# Patient Record
Sex: Female | Born: 1945 | Race: White | Hispanic: No | Marital: Married | Smoking: Former smoker
Health system: Southern US, Community
[De-identification: ages and names within clinical notes are randomized; demographics above are authoritative.]

## PROBLEM LIST (undated history)

## (undated) DIAGNOSIS — F32A Depression, unspecified: Secondary | ICD-10-CM

## (undated) DIAGNOSIS — M1990 Unspecified osteoarthritis, unspecified site: Secondary | ICD-10-CM

## (undated) DIAGNOSIS — F329 Major depressive disorder, single episode, unspecified: Secondary | ICD-10-CM

## (undated) DIAGNOSIS — N390 Urinary tract infection, site not specified: Secondary | ICD-10-CM

## (undated) DIAGNOSIS — I10 Essential (primary) hypertension: Secondary | ICD-10-CM

## (undated) DIAGNOSIS — M879 Osteonecrosis, unspecified: Secondary | ICD-10-CM

## (undated) DIAGNOSIS — E119 Type 2 diabetes mellitus without complications: Secondary | ICD-10-CM

## (undated) DIAGNOSIS — R0600 Dyspnea, unspecified: Secondary | ICD-10-CM

## (undated) DIAGNOSIS — E6609 Other obesity due to excess calories: Secondary | ICD-10-CM

## (undated) DIAGNOSIS — Z87442 Personal history of urinary calculi: Secondary | ICD-10-CM

## (undated) DIAGNOSIS — J449 Chronic obstructive pulmonary disease, unspecified: Secondary | ICD-10-CM

## (undated) DIAGNOSIS — Z9289 Personal history of other medical treatment: Secondary | ICD-10-CM

## (undated) DIAGNOSIS — F419 Anxiety disorder, unspecified: Secondary | ICD-10-CM

## (undated) HISTORY — DX: Anxiety disorder, unspecified: F41.9

## (undated) HISTORY — DX: Urinary tract infection, site not specified: N39.0

## (undated) HISTORY — DX: Osteonecrosis, unspecified: M87.9

## (undated) HISTORY — PX: FINGER SURGERY: SHX640

## (undated) HISTORY — DX: Essential (primary) hypertension: I10

## (undated) HISTORY — PX: TUBAL LIGATION: SHX77

## (undated) HISTORY — PX: CATARACT EXTRACTION: SUR2

## (undated) HISTORY — PX: TOTAL KNEE ARTHROPLASTY: SHX125

## (undated) HISTORY — DX: Depression, unspecified: F32.A

## (undated) HISTORY — DX: Personal history of other medical treatment: Z92.89

## (undated) HISTORY — DX: Other obesity due to excess calories: E66.09

## (undated) HISTORY — PX: OTHER SURGICAL HISTORY: SHX169

## (undated) HISTORY — DX: Type 2 diabetes mellitus without complications: E11.9

## (undated) HISTORY — DX: Unspecified osteoarthritis, unspecified site: M19.90

## (undated) HISTORY — DX: Major depressive disorder, single episode, unspecified: F32.9

---

## 1997-08-31 ENCOUNTER — Ambulatory Visit (HOSPITAL_BASED_OUTPATIENT_CLINIC_OR_DEPARTMENT_OTHER): Admission: RE | Admit: 1997-08-31 | Discharge: 1997-08-31 | Payer: Self-pay | Admitting: Orthopedic Surgery

## 1997-12-04 ENCOUNTER — Other Ambulatory Visit: Admission: RE | Admit: 1997-12-04 | Discharge: 1997-12-04 | Payer: Self-pay | Admitting: Specialist

## 1999-01-07 ENCOUNTER — Ambulatory Visit: Admission: RE | Admit: 1999-01-07 | Discharge: 1999-01-07 | Payer: Self-pay | Admitting: Specialist

## 1999-02-05 ENCOUNTER — Encounter: Payer: Self-pay | Admitting: Specialist

## 1999-02-11 ENCOUNTER — Encounter: Payer: Self-pay | Admitting: Specialist

## 1999-02-11 ENCOUNTER — Inpatient Hospital Stay (HOSPITAL_COMMUNITY): Admission: RE | Admit: 1999-02-11 | Discharge: 1999-02-17 | Payer: Self-pay | Admitting: Specialist

## 2003-10-15 ENCOUNTER — Ambulatory Visit (HOSPITAL_COMMUNITY): Admission: RE | Admit: 2003-10-15 | Discharge: 2003-10-15 | Payer: Self-pay | Admitting: *Deleted

## 2004-02-05 ENCOUNTER — Ambulatory Visit: Payer: Self-pay | Admitting: Family Medicine

## 2004-12-09 ENCOUNTER — Ambulatory Visit: Payer: Self-pay | Admitting: Family Medicine

## 2004-12-17 ENCOUNTER — Ambulatory Visit: Payer: Self-pay | Admitting: Family Medicine

## 2005-06-23 ENCOUNTER — Ambulatory Visit: Payer: Self-pay | Admitting: Family Medicine

## 2006-02-23 ENCOUNTER — Ambulatory Visit: Payer: Self-pay | Admitting: Family Medicine

## 2006-02-24 ENCOUNTER — Encounter: Admission: RE | Admit: 2006-02-24 | Discharge: 2006-02-24 | Payer: Self-pay | Admitting: Family Medicine

## 2006-03-03 ENCOUNTER — Ambulatory Visit: Payer: Self-pay | Admitting: Family Medicine

## 2006-03-04 ENCOUNTER — Ambulatory Visit: Payer: Self-pay | Admitting: Family Medicine

## 2006-09-01 ENCOUNTER — Telehealth: Payer: Self-pay | Admitting: Family Medicine

## 2006-09-01 ENCOUNTER — Ambulatory Visit: Payer: Self-pay | Admitting: Family Medicine

## 2006-09-01 LAB — CONVERTED CEMR LAB
Bilirubin Urine: NEGATIVE
Blood in Urine, dipstick: NEGATIVE
Glucose, Urine, Semiquant: NEGATIVE
Nitrite: NEGATIVE
Protein, U semiquant: NEGATIVE
Specific Gravity, Urine: 1.025
Urobilinogen, UA: 0.2
WBC Urine, dipstick: NEGATIVE
pH: 5

## 2006-09-08 ENCOUNTER — Encounter: Payer: Self-pay | Admitting: Family Medicine

## 2006-11-16 ENCOUNTER — Telehealth: Payer: Self-pay | Admitting: Family Medicine

## 2006-11-24 ENCOUNTER — Ambulatory Visit: Payer: Self-pay | Admitting: Family Medicine

## 2006-11-24 LAB — CONVERTED CEMR LAB
Bilirubin Urine: NEGATIVE
Blood in Urine, dipstick: NEGATIVE
Glucose, Urine, Semiquant: NEGATIVE
Ketones, urine, test strip: NEGATIVE
Nitrite: NEGATIVE
Specific Gravity, Urine: >=1.03
Urobilinogen, UA: 0.2
WBC Urine, dipstick: NEGATIVE
pH: 5.5

## 2006-12-01 DIAGNOSIS — I10 Essential (primary) hypertension: Secondary | ICD-10-CM

## 2006-12-02 ENCOUNTER — Ambulatory Visit: Payer: Self-pay | Admitting: Family Medicine

## 2006-12-02 LAB — CONVERTED CEMR LAB
ALT: 47 units/L — ABNORMAL HIGH (ref 0–35)
AST: 41 units/L — ABNORMAL HIGH (ref 0–37)
Albumin: 3.9 g/dL (ref 3.5–5.2)
Alkaline Phosphatase: 111 units/L (ref 39–117)
BUN: 13 mg/dL (ref 6–23)
Basophils Absolute: 0.1 10*3/uL (ref 0.0–0.1)
Basophils Relative: 0.5 % (ref 0.0–1.0)
Bilirubin, Direct: 0.2 mg/dL (ref 0.0–0.3)
CO2: 29 meq/L (ref 19–32)
Calcium: 9.7 mg/dL (ref 8.4–10.5)
Chloride: 105 meq/L (ref 96–112)
Cholesterol: 186 mg/dL (ref 0–200)
Creatinine, Ser: 0.9 mg/dL (ref 0.4–1.2)
Eosinophils Absolute: 0.4 10*3/uL (ref 0.0–0.6)
Eosinophils Relative: 3.5 % (ref 0.0–5.0)
GFR calc Af Amer: 82 mL/min
GFR calc non Af Amer: 67 mL/min
Glucose, Bld: 119 mg/dL — ABNORMAL HIGH (ref 70–99)
HCT: 38.2 % (ref 36.0–46.0)
HDL: 35.4 mg/dL — ABNORMAL LOW (ref >39.0)
Hemoglobin: 13.2 g/dL (ref 12.0–15.0)
LDL Cholesterol: 125 mg/dL — ABNORMAL HIGH (ref 0–99)
Lymphocytes Relative: 34.1 % (ref 12.0–46.0)
MCHC: 34.6 g/dL (ref 30.0–36.0)
MCV: 89.9 fL (ref 78.0–100.0)
Monocytes Absolute: 0.5 10*3/uL (ref 0.2–0.7)
Monocytes Relative: 5.1 % (ref 3.0–11.0)
Neutro Abs: 5.8 10*3/uL (ref 1.4–7.7)
Neutrophils Relative %: 56.8 % (ref 43.0–77.0)
Platelets: 409 10*3/uL — ABNORMAL HIGH (ref 150–400)
Potassium: 4.6 meq/L (ref 3.5–5.1)
RBC: 4.25 M/uL (ref 3.87–5.11)
RDW: 12.7 % (ref 11.5–14.6)
Sodium: 141 meq/L (ref 135–145)
TSH: 1.14 microintl units/mL (ref 0.35–5.50)
Total Bilirubin: 0.8 mg/dL (ref 0.3–1.2)
Total CHOL/HDL Ratio: 5.3
Total Protein: 6.8 g/dL (ref 6.0–8.3)
Triglycerides: 128 mg/dL (ref 0–149)
VLDL: 26 mg/dL (ref 0–40)
WBC: 10.3 10*3/uL (ref 4.5–10.5)

## 2006-12-28 ENCOUNTER — Telehealth: Payer: Self-pay | Admitting: Family Medicine

## 2007-04-29 ENCOUNTER — Ambulatory Visit: Payer: Self-pay | Admitting: Family Medicine

## 2007-07-21 ENCOUNTER — Ambulatory Visit: Payer: Self-pay | Admitting: Family Medicine

## 2007-07-21 DIAGNOSIS — J4489 Other specified chronic obstructive pulmonary disease: Secondary | ICD-10-CM | POA: Insufficient documentation

## 2007-07-21 DIAGNOSIS — J449 Chronic obstructive pulmonary disease, unspecified: Secondary | ICD-10-CM

## 2007-07-21 DIAGNOSIS — E669 Obesity, unspecified: Secondary | ICD-10-CM

## 2007-07-21 DIAGNOSIS — F341 Dysthymic disorder: Secondary | ICD-10-CM

## 2007-09-14 ENCOUNTER — Telehealth: Payer: Self-pay | Admitting: Family Medicine

## 2008-02-14 ENCOUNTER — Telehealth: Payer: Self-pay | Admitting: Family Medicine

## 2008-02-15 ENCOUNTER — Ambulatory Visit: Payer: Self-pay | Admitting: Family Medicine

## 2008-02-15 DIAGNOSIS — J069 Acute upper respiratory infection, unspecified: Secondary | ICD-10-CM | POA: Insufficient documentation

## 2008-07-10 ENCOUNTER — Telehealth: Payer: Self-pay | Admitting: Family Medicine

## 2008-07-12 ENCOUNTER — Telehealth: Payer: Self-pay | Admitting: Family Medicine

## 2008-08-16 ENCOUNTER — Telehealth: Payer: Self-pay | Admitting: Family Medicine

## 2008-10-15 ENCOUNTER — Ambulatory Visit: Payer: Self-pay | Admitting: Family Medicine

## 2009-06-18 ENCOUNTER — Telehealth: Payer: Self-pay | Admitting: Family Medicine

## 2009-07-17 ENCOUNTER — Telehealth: Payer: Self-pay | Admitting: Family Medicine

## 2009-07-19 ENCOUNTER — Ambulatory Visit: Payer: Self-pay | Admitting: Family Medicine

## 2009-07-19 LAB — CONVERTED CEMR LAB
Bilirubin Urine: NEGATIVE
Blood in Urine, dipstick: NEGATIVE
Glucose, Urine, Semiquant: NEGATIVE
Ketones, urine, test strip: NEGATIVE
Nitrite: NEGATIVE
Protein, U semiquant: NEGATIVE
Specific Gravity, Urine: >=1.03
Urobilinogen, UA: 0.2
WBC Urine, dipstick: NEGATIVE
pH: 5

## 2009-08-08 ENCOUNTER — Ambulatory Visit: Payer: Self-pay | Admitting: Family Medicine

## 2009-08-08 LAB — CONVERTED CEMR LAB
ALT: 31 units/L (ref 0–35)
AST: 31 units/L (ref 0–37)
Albumin: 4.1 g/dL (ref 3.5–5.2)
Alkaline Phosphatase: 90 units/L (ref 39–117)
BUN: 14 mg/dL (ref 6–23)
Basophils Absolute: 0 10*3/uL (ref 0.0–0.1)
Basophils Relative: 0.6 % (ref 0.0–3.0)
Bilirubin, Direct: 0.1 mg/dL (ref 0.0–0.3)
CO2: 30 meq/L (ref 19–32)
Calcium: 9.7 mg/dL (ref 8.4–10.5)
Chloride: 108 meq/L (ref 96–112)
Cholesterol: 178 mg/dL (ref 0–200)
Creatinine, Ser: 0.8 mg/dL (ref 0.4–1.2)
Eosinophils Absolute: 0.3 10*3/uL (ref 0.0–0.7)
Eosinophils Relative: 3.1 % (ref 0.0–5.0)
GFR calc non Af Amer: 79.99 mL/min (ref >60)
Glucose, Bld: 113 mg/dL — ABNORMAL HIGH (ref 70–99)
HCT: 40.7 % (ref 36.0–46.0)
HDL: 36.5 mg/dL — ABNORMAL LOW (ref >39.00)
Hemoglobin: 13.8 g/dL (ref 12.0–15.0)
LDL Cholesterol: 121 mg/dL — ABNORMAL HIGH (ref 0–99)
Lymphocytes Relative: 35.6 % (ref 12.0–46.0)
Lymphs Abs: 3 10*3/uL (ref 0.7–4.0)
MCHC: 34 g/dL (ref 30.0–36.0)
MCV: 92.9 fL (ref 78.0–100.0)
Monocytes Absolute: 0.5 10*3/uL (ref 0.1–1.0)
Monocytes Relative: 5.9 % (ref 3.0–12.0)
Neutro Abs: 4.6 10*3/uL (ref 1.4–7.7)
Neutrophils Relative %: 54.8 % (ref 43.0–77.0)
Platelets: 353 10*3/uL (ref 150.0–400.0)
Potassium: 4.8 meq/L (ref 3.5–5.1)
RBC: 4.37 M/uL (ref 3.87–5.11)
RDW: 13.2 % (ref 11.5–14.6)
Sodium: 143 meq/L (ref 135–145)
TSH: 0.88 microintl units/mL (ref 0.35–5.50)
Total Bilirubin: 0.7 mg/dL (ref 0.3–1.2)
Total CHOL/HDL Ratio: 5
Total Protein: 7.4 g/dL (ref 6.0–8.3)
Triglycerides: 105 mg/dL (ref 0.0–149.0)
VLDL: 21 mg/dL (ref 0.0–40.0)
WBC: 8.4 10*3/uL (ref 4.5–10.5)

## 2009-09-10 ENCOUNTER — Telehealth: Payer: Self-pay | Admitting: Family Medicine

## 2009-10-31 ENCOUNTER — Telehealth: Payer: Self-pay | Admitting: Family Medicine

## 2009-12-30 ENCOUNTER — Ambulatory Visit: Payer: Self-pay | Admitting: Internal Medicine

## 2009-12-30 DIAGNOSIS — M545 Low back pain: Secondary | ICD-10-CM

## 2009-12-30 DIAGNOSIS — N23 Unspecified renal colic: Secondary | ICD-10-CM

## 2009-12-30 LAB — CONVERTED CEMR LAB
Bilirubin Urine: NEGATIVE
Glucose, Urine, Semiquant: NEGATIVE
Ketones, urine, test strip: NEGATIVE
Nitrite: NEGATIVE
Specific Gravity, Urine: 1.015
Urobilinogen, UA: 0.2
WBC Urine, dipstick: NEGATIVE
pH: 6

## 2010-01-22 ENCOUNTER — Telehealth: Payer: Self-pay | Admitting: Family Medicine

## 2010-01-30 ENCOUNTER — Telehealth: Payer: Self-pay | Admitting: Family Medicine

## 2010-02-06 ENCOUNTER — Ambulatory Visit
Admission: RE | Admit: 2010-02-06 | Discharge: 2010-02-06 | Payer: Self-pay | Source: Home / Self Care | Attending: Family Medicine | Admitting: Family Medicine

## 2010-02-06 ENCOUNTER — Telehealth: Payer: Self-pay | Admitting: Family Medicine

## 2010-02-06 LAB — CONVERTED CEMR LAB
Protein, U semiquant: NEGATIVE
Urobilinogen, UA: 0.2
WBC Urine, dipstick: NEGATIVE

## 2010-02-07 ENCOUNTER — Encounter: Payer: Self-pay | Admitting: Family Medicine

## 2010-02-25 NOTE — Progress Notes (Signed)
Summary: refill  clorazepate  Phone Note Call from Patient Call back at [phone]   Caller: Patient---live cal Summary of Call: pt has a cpx on 08-01-2009. please refill her clorazepate 3.75mg   until cpx.   [facility] [place]---[phone]. only has 5 pills left. Initial call taken by: Warnell Forester,  July 17, 2009 1:39 PM  Follow-up for Phone Call        ok x 1 til seen.  Follow-up by: Pura Spice, RN,  July 17, 2009 2:41 PM    Prescriptions: CLORAZEPATE DIPOTASSIUM 3.75 MG  TABS (CLORAZEPATE DIPOTASSIUM) three times a day  #90 x 0   Entered by:   Pura Spice, RN   Authorized by:   Judithann Sheen MD   Signed by:   Pura Spice, RN on 07/17/2009   Method used:   Telephoned to ...       [facility]  [address] [phone][facility] ([facility]ail[facility]       [address]       [place], [place]  [address]       Ph: [phone] or [phone]       Fax: [phone]   RxID:   [id number]

## 2010-02-25 NOTE — Progress Notes (Signed)
Summary: needs appt   ---- Converted from flag ---- ---- 06/17/2009 8:17 AM, Lucy Antigua wrote: I called Shannon Lynch and told her that her potassium has been called in, but she needs a cpx. She said that she would call back later and sch cpx for next month.      ---- 06/13/2009 10:13 AM, Pura Spice, RN wrote: would you call Shannon Lynch  [phone] for cpx  tell her i called in her postassium but she needs to be seen thanks ------------------------------

## 2010-02-25 NOTE — Assessment & Plan Note (Signed)
Summary: cpx/ccm----PT [facility] // RS   Vital Signs:  Patient profile:   65 year old female Height:      61 inches Weight:      234 pounds O2 Sat:      98 % Temp:     98.3 degrees F Pulse rate:   85 / minute Pulse rhythm:   regular BP sitting:   130 / 82  (left arm) Cuff size:   large  Vitals Entered By: Pura Spice, RN (August 08, 2009 1:38 PM) CC: cpx No Pap. Refused EKG    History of Present Illness: This 65 year old white married female whose husband is dilatated with several medical problems but is still able to work. 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 Had torn rotator cuff 5 years ago and has been on the treatment of Dr. Hayden Rasmussen Blood pressure one control Has had no episodes of wheezing, no ulnar problems  Allergies: 1)  ! Codeine 2)  ! Cipro  Past History:  Past Medical History: Last updated: 12/01/2006 Hypertension  Past Surgical History: Last updated: 12/01/2006 Total knee replacement  Review of Systems      See HPI  The patient denies anorexia, fever, weight loss, weight gain, vision loss, decreased hearing, hoarseness, chest pain, syncope, dyspnea on exertion, peripheral edema, prolonged cough, headaches, hemoptysis, abdominal pain, melena, hematochezia, severe indigestion/heartburn, hematuria, incontinence, genital sores, muscle weakness, suspicious skin lesions, transient blindness, difficulty walking, depression, unusual weight change, abnormal bleeding, enlarged lymph nodes, angioedema, breast masses, and testicular masses.    Physical Exam  General:   Well-developed,well-nourished,in no acute distress; alert,appropriate and cooperative throughout examinationoverweight-appearing.   Head:  Normocephalic and atraumatic without obvious abnormalities. No apparent alopecia or balding. Eyes:  No corneal or conjunctival inflammation noted. EOMI. Perrla. Funduscopic exam benign, without hemorrhages, exudates or papilledema. Vision grossly normal. Ears:  External ear exam shows no significant lesions or deformities.  Otoscopic examination reveals clear canals, tympanic membranes are intact bilaterally without bulging, retraction, inflammation or discharge. Hearing is grossly normal bilaterally. Nose:  External nasal examination shows no deformity or inflammation. Nasal mucosa are pink and moist without lesions or exudates. Mouth:  Oral mucosa and oropharynx without lesions or exudates.  Teeth in good repair. Neck:  No deformities, masses, or tenderness noted. Chest Wall:  No deformities, masses, or tenderness noted. Breasts:  No mass, nodules, thickening, tenderness, bulging, retraction, inflamation, nipple discharge or skin changes noted.   Lungs:  Normal respiratory effort, chest expands symmetrically. Lungs are clear to auscultation, no crackles or wheezes. Heart:  Normal rate and regular rhythm. S1 and S2 normal without gallop, murmur, click, rub or other extra sounds. Abdomen:  Bowel sounds positive,abdomen soft and non-tender without masses, organomegaly or hernias noted., obese Rectal:  I examined Genitalia:  not examined patient desired to wait and return for Pap Msk:  tenderness over her right rotator cuff area limited use on hyperextension and also posteriorly Pulses:  R and L carotid,radial,femoral,dorsalis pedis and posterior tibial pulses are full and equal bilaterally Extremities:  left pretibial edema and right pretibial edema.   Neurologic:  No cranial nerve deficits noted. Station and gait are normal. Plantar reflexes are down-going  bilaterally. DTRs are symmetrical throughout. Sensory, motor and coordinative functions appear intact. Skin:  Intact without suspicious lesions or rashes Cervical Nodes:  No lymphadenopathy noted Axillary Nodes:  No palpable lymphadenopathy Inguinal Nodes:  No significant adenopathyR inguinal LN matted.   Psych:  Cognition and judgment appear intact. Alert and cooperative with normal attention span and concentration. No apparent delusions, illusions, hallucinations   Impression & Recommendations:  Problem # 1:  PHYSICAL EXAMINATION (ICD-V70.0) Assessment New  Problem # 2:  ANXIETY DEPRESSION (ICD-300.4) Assessment: Deteriorated continue half tablet a Celexa and start Cymbalta 30 mg q.d. for one week then increase to 60 mg q. day  Problem # 3:  EXOGENOUS OBESITY (ICD-278.00) Assessment: Unchanged Pjhenteramine 37.5 q.a.m. for decreased appetite  Problem # 4:  COPD, MILD (ICD-496) Assessment: Improved  Her updated medication list for this problem includes:    Proair Hfa 108 (90 Base) Mcg/act Aers (Albuterol sulfate) .Marland Kitchen... 2 inhalations three times a day for wheezing  Problem # 5:  HYPERTENSION (ICD-401.9) Assessment: Improved  Her updated medication list for this problem includes:    Furosemide 20 Mg Tabs (Furosemide) .Marland Kitchen..Marland Kitchen Two times a day for edema    Cozaar 50 Mg Tabs (Losartan potassium) .Marland Kitchen... 1 once daily for bp  Complete Medication List: 1)  Clorazepate Dipotassium 3.75 Mg Tabs (Clorazepate dipotassium) .... Three times a day 2)  Furosemide 20 Mg Tabs (Furosemide) .... Two times a day for edema 3)  Proair Hfa 108 (90 Base) Mcg/act Aers (Albuterol sulfate) .... 2 inhalations three times a day for wheezing 4)  Meloxicam 15 Mg Tabs (Meloxicam) .Marland Kitchen.. 1 qd 5)  Klor-con M20 20 Meq Cr-tabs (Potassium chloride crys cr) .Marland Kitchen.. 1 qd 6)  Cozaar 50 Mg Tabs (Losartan potassium) .Marland Kitchen.. 1 once daily for bp 7)  Celexa 40 Mg Tabs (Citalopram hydrobromide) .Marland Kitchen.. 1 qd 8)  Levaquin 500 Mg Tabs  (Levofloxacin) .Marland Kitchen.. 1 once daily for infection 9)  Phenteramine 37.5  .Marland Kitchen.. 1 each morning to decrease  appetite 10)  Cymbalta 60 Mg Cpep (Duloxetine hcl) .... Terminal with each day for one week then 60 mg one day  Patient Instructions: 1)  Labs good 2)  continue 1/2 tab each dayCelexa 3)  start cymbalta 30 mg  daily for 1 week then increase 60 mg reach day 4)  refilled medications 5)  You need to lose weight. Consider a lower calorie diet and regular exercise.  Prescriptions: PHENTERAMINE 37.5 1 each morning to decrease  appetite  #30 x 3   Entered and Authorized by:   Judithann Sheen MD   Signed by:   Judithann Sheen MD on 08/08/2009   Method used:   Print then Give to Patient   RxID:   [id number] CLORAZEPATE DIPOTASSIUM 3.75 MG  TABS (CLORAZEPATE DIPOTASSIUM) three times a day  #90 x 5   Entered and Authorized by:   Judithann Sheen MD   Signed by:   Judithann Sheen MD on 08/08/2009   Method used:   Print then Give to Patient   RxID:   [id number] CELEXA 40 MG TABS (CITALOPRAM HYDROBROMIDE) 1 qd  #30 x 11   Entered and Authorized by:   Judithann Sheen MD   Signed by:   Judithann Sheen MD on 08/08/2009   Method used:   [facility]        [facility]  [address] [phone]* (retail)       [address]       [place], [place]  [address]       Ph: [phone] or [phone]       Fax: [phone]   RxID:   [id number] COZAAR 50 MG TABS (LOSARTAN POTASSIUM) 1 once daily for BP  #30 x 11   Entered and Authorized by:   Judithann Sheen MD   Signed by:   Judithann Sheen MD on 08/08/2009   Method used:   [facility]ically [facility]        [facility]  [address] [phone]* (retail)       [address]       [place], [place]  [address]       Ph: [phone] or [phone]       Fax: [phone]   RxID:   [id number] KLOR-CON M20 20 MEQ CR-TABS (POTASSIUM CHLORIDE CRYS CR) 1 qd  #30 x 11   Entered and  Authorized by:   Judithann Sheen MD   Signed by:   Judithann Sheen MD on 08/08/2009   Method used:   Electronically to        [facility]  [address] [phone]* (retail)       [address]       [place], [place]  [address]       Ph: [phone] or [phone]       Fax: [phone]   RxID:   [id number] MELOXICAM 15 MG  TABS (MELOXICAM) 1 qd  #30 x 11   Entered and Authorized by:   Judithann Sheen MD   Signed by:   Judithann Sheen MD on 08/08/2009   Method used:   Electronically to        [facility]  [address] [phone]* (retail)       [address]       [place], [place]  [address]       Ph: [phone] or [phone]       Fax: [phone]   RxID:   [id number] FUROSEMIDE 20 MG  TABS (FUROSEMIDE) two times a day for edema  #60 x 11   Entered and Authorized by:   Judithann Sheen MD   Signed by:   Judithann Sheen MD on 08/08/2009   Method used:   Electronically to        [facility]  [address] [phone]* (retail)       [address]       [place], [place]  [address]       Ph: [phone] or [phone]       Fax: [phone]   RxID:   [id number] LEVAQUIN 500 MG TABS (LEVOFLOXACIN) 1 once daily for infection  #10 x 1   Entered and Authorized by:   Judithann Sheen MD   Signed by:   Judithann Sheen MD on 08/08/2009   Method used:   Electronically [facility]        [facility]  [address] [phone]* (retail)       [address]       [place], [place]  [address]  Ph: [phone] or [phone]       Fax: [phone]   RxID:   [id number]

## 2010-02-25 NOTE — Progress Notes (Signed)
Summary: samples  Phone Note Call from Patient   Caller: Patient Call For: Judithann Sheen MD Summary of Call: Needs Cymbalta 60 mg. samples.  Left #28 pills to pick up. Initial call taken by: Lynann Beaver CMA,  October 31, 2009 10:02 AM  Follow-up for Phone Call        I think the pills have already been left for patient please verify Follow-up by: Danise Edge MD,  October 31, 2009 10:43 AM    New/Updated Medications: CYMBALTA 60 MG CPEP (DULOXETINE HCL) one daily Prescriptions: CYMBALTA 60 MG CPEP (DULOXETINE HCL) one daily  #30 x 1   Entered by:   Josph Macho RMA   Authorized by:   Danise Edge MD   Signed by:   Josph Macho RMA on 10/31/2009   Method used:   [facility]        [facility]  [address] [phone]* (retail)       [address]       [place], [place]  [address]       Ph: [phone] or [phone]       Fax: [phone]   RxID:   [id number]  I tried to call patient to inform her of meds being sent to pharmacy but phone just rings no vm/ CF

## 2010-02-25 NOTE — Assessment & Plan Note (Signed)
Summary: ? kidney stones?/dm   Vital Signs:  Patient profile:   65 year old female Weight:      232 pounds Temp:     97.7 degrees F oral BP sitting:   124 / 80  (left arm) Cuff size:   large  Vitals Entered By: Duard Brady LPN (December 30, 2009 1:04 PM) CC: c/o low bacl pain , (L) lower abd pain   ???kidney stone Is Patient Diabetic? No   CC:  c/o low bacl pain  and (L) lower abd pain   ???kidney stone.  History of Present Illness: 65 year old patient who presents with a two week history of left flank discomfort.  two days ago, the patient has severe left leg pain with radiation to the left lower quadrant.  No prior history of kidney stones.  She's had intermittent mild pain for the past two days, but this morning had total resolution of discomfort.  No hematuria.  A  UA was reviewed today that revealed large occult blood, otherwise, normal  Allergies: 1)  ! Codeine 2)  ! Cipro  Past History:  Past Medical History: Reviewed history from 12/01/2006 and no changes required. Hypertension  Past Surgical History: Reviewed history from 12/01/2006 and no changes required. Total knee replacement  Review of Systems       The patient complains of abdominal pain.  The patient denies anorexia, fever, weight loss, weight gain, vision loss, decreased hearing, hoarseness, chest pain, syncope, dyspnea on exertion, peripheral edema, prolonged cough, headaches, hemoptysis, melena, hematochezia, severe indigestion/heartburn, hematuria, incontinence, genital sores, muscle weakness, suspicious skin lesions, transient blindness, difficulty walking, depression, unusual weight change, abnormal bleeding, enlarged lymph nodes, angioedema, and breast masses.    Physical Exam  General:  overweight-appearing.  normal blood pressure, no distressoverweight-appearing.   Neck:  No deformities, masses, or tenderness noted. Lungs:  Normal respiratory effort, chest expands symmetrically. Lungs are  clear to auscultation, no crackles or wheezes. Heart:  Normal rate and regular rhythm. S1 and S2 normal without gallop, murmur, click, rub or other extra sounds. Abdomen:  nCVA tenderness.  No abdominal tenderness.  Bowel sounds normal.  No guarding.  No organomegaly or masses   Impression & Recommendations:  Problem # 1:  RENAL COLIC (ICD-788.0) this is most consistent with renal colic.  She has been totally pain-free for most of the day.  She has been asked to force fluids and to call for any recurrent discomfort.  She has been asked to return in 6 weeks for follow-up and repeat UA  Problem # 2:  HYPERTENSION (ICD-401.9)  Her updated medication list for this problem includes:    Furosemide 20 Mg Tabs (Furosemide) .Marland Kitchen..Marland Kitchen Two times a day for edema    Cozaar 50 Mg Tabs (Losartan potassium) .Marland Kitchen... 1 once daily for bp  Her updated medication list for this problem includes:    Furosemide 20 Mg Tabs (Furosemide) .Marland Kitchen..Marland Kitchen Two times a day for edema    Cozaar 50 Mg Tabs (Losartan potassium) .Marland Kitchen... 1 once daily for bp  Complete Medication List: 1)  Clorazepate Dipotassium 3.75 Mg Tabs (Clorazepate dipotassium) .... Three times a day 2)  Furosemide 20 Mg Tabs (Furosemide) .... Two times a day for edema 3)  Proair Hfa 108 (90 Base) Mcg/act Aers (Albuterol sulfate) .... 2 inhalations three times a day for wheezing 4)  Meloxicam 15 Mg Tabs (Meloxicam) .Marland Kitchen.. 1 qd 5)  Klor-con M20 20 Meq Cr-tabs (Potassium chloride crys cr) .Marland Kitchen.. 1 qd 6)  Cozaar 50 Mg Tabs (Losartan potassium) .Marland Kitchen.. 1 once daily for bp 7)  Celexa 40 Mg Tabs (Citalopram hydrobromide) .Marland Kitchen.. 1 qd 8)  Levaquin 500 Mg Tabs (Levofloxacin) .Marland Kitchen.. 1 once daily for infection 9)  Phenteramine 37.5  .Marland Kitchen.. 1 each morning to decrease  appetite 10)  Cymbalta 60 Mg Cpep (Duloxetine hcl) .... One daily  Other Orders: UA Dipstick w/o Micro (manual) ([address])  Patient Instructions: 1)  Please schedule a follow-up appointment in 6  weeks Dr Scotty Court 2)  Drink  as much fluid as you can tolerate for the next few days. 3)  Limit your Sodium (Salt) to less than 2 grams a day(slightly less than 1/2 a teaspoon) to prevent fluid retention, swelling, or worsening of symptoms.   Orders Added: 1)  UA Dipstick w/o Micro (manual) [81002] 2)  Est. Patient Level III [60[address]]    Laboratory Results   Urine Tests  Date/Time Received: December 30, 2009 1:16 PM  Date/Time Reported: December 30, 2009 1:16 PM   Routine Urinalysis   Color: yellow Appearance: Hazy Glucose: negative   (Normal Range: Negative) Bilirubin: negative   (Normal Range: Negative) Ketone: negative   (Normal Range: Negative) Spec. Gravity: 1.015   (Normal Range: 1.003-1.035) Blood: large   (Normal Range: Negative) pH: 6.0   (Normal Range: 5.0-8.0) Protein: trace   (Normal Range: Negative) Urobilinogen: 0.2   (Normal Range: 0-1) Nitrite: negative   (Normal Range: Negative) Leukocyte Esterace: negative   (Normal Range: Negative)

## 2010-02-25 NOTE — Progress Notes (Signed)
Summary: med questions  Phone Note Call from Patient   Caller: Patient Call For: Judithann Sheen MD Summary of Call: Cymbalta 60 mg one by mouth daily....Marland KitchenMarland KitchenCelexa 40 mg. 1/2 q day.  Helping joints, but mid-morning "has a lag", and can get upset easily. [phone]  [phone]  Needs more samples. Initial call taken by: Lynann Beaver CMA,  September 10, 2009 1:04 PM  Follow-up for Phone Call        no samples celexa but do have cymbalta  available will call in refill ? drug store  Follow-up by: Pura Spice, RN,  September 10, 2009 1:28 PM  Additional Follow-up for Phone Call Additional follow up Details #1::        Pt wants to speak to Dr. Scotty Court or Almira Coaster about her progress on meds, and any changes he may need to make.  Did not want to talk to me. Additional Follow-up by: Lynann Beaver CMA,  September 10, 2009 1:32 PM    Additional Follow-up for Phone Call Additional follow up Details #2::    pt notiifed and samples cymbalta will be at front desk Nadirah Socorro mills wants dr Scotty Court to know she is on generic celexa clorzapate and cymbalta 60mg  and feels like crying between 7 am -11 am .  but felt little better this week, and stated will stay on meds like she been doing and will call in 10 days with update . Follow-up by: Pura Spice, RN,  September 10, 2009 1:54 PM  Additional Follow-up for Phone Call Additional follow up Details #3:: Details for Additional Follow-up Action Taken: dr Scotty Court   called pt and informed her to take celexa 40mg  1 by mouth once daily  Additional Follow-up by: Pura Spice, RN,  September 10, 2009 2:26 PM

## 2010-02-27 NOTE — Progress Notes (Signed)
Summary: antibotic  Phone Note Call from Patient Call back at [facility] Phone [phone]   Caller: Patient Call For: stafford Summary of Call: pt has cough and congestion and a yeast infection.  Wants something called in Initial call taken by: Alfred Levins, CMA,  February 06, 2010 11:24 AM  Follow-up for Phone Call        Pt is calling to ask someone to call her back about her tests, and symptoms. [phone] Follow-up by: Lynann Beaver CMA AAMA,  February 07, 2010 9:25 AM  Additional Follow-up for Phone Call Additional follow up Details #1::        Pt is wondering why a med has not been called in yet and why she has yet to rcv her lab results. Pls call asap today. Pls call pt asap and let her know status. [phone] Additional Follow-up by: Lucy Antigua,  February 07, 2010 9:49 AM    Additional Follow-up for Phone Call Additional follow up Details #2::    Called pt and discussed tx Follow-up by: Judithann Sheen MD,  February 07, 2010 4:43 PM  8

## 2010-02-27 NOTE — Progress Notes (Signed)
Summary: refill Levaquin  Phone Note Call from Patient   Caller: Patient Call For: Judithann Sheen MD [facility] [employer] Pt still has dysuria and some chest congestion.  Wants to refill Levaquin. [phone] Initial call taken by: Lynann Beaver CMA AAMA,  January 30, 2010 12:33 PM  Follow-up for Phone Call        Per Dr. Scotty Court- pt needs a ua and culture done Follow-up by: Romualdo Bolk, CMA Duncan Dull),  February 04, 2010 8:47 AM  Additional Follow-up for Phone Call Additional follow up Details #1::        Pt will come Thursday for Urine Culture and UA Additional Follow-up by: [facility] CMA AAMA,  February 04, 2010 8:52 AM

## 2010-02-27 NOTE — Progress Notes (Signed)
Summary: wants dr Scotty Court to return call  Phone Note Call from Patient Call back at [phone] or [phone]   Caller: Patient---live call Reason for Call: Talk to Doctor Summary of Call: wants Dr Scotty Court to return her call. been in bed. Initial call taken by: Warnell Forester,  January 22, 2010 9:05 AM  Follow-up for Phone Call        been sick for 2 weeks alot of congestion coughing alot taking mucinex dm sputum clear to green . Has not taking fever .  saw dr Kirtland Bouchard. few weeks ago  and was told had kidney stone and now on levaquin and pyridium from last yr and she started taking this last nite.  pls call to [employer] [facility] [facility].   Follow-up by: Pura Spice, RN,  January 23, 2010 10:21 AM  Additional Follow-up for Phone Call Additional follow up Details #1::        dr Scotty Court spoke with patient Additional Follow-up by: Kern Reap CMA Duncan Dull),  January 24, 2010 10:34 AM

## 2010-04-25 ENCOUNTER — Other Ambulatory Visit: Payer: Self-pay | Admitting: Family Medicine

## 2010-05-03 ENCOUNTER — Other Ambulatory Visit: Payer: Self-pay | Admitting: Family Medicine

## 2010-05-05 ENCOUNTER — Telehealth: Payer: Self-pay

## 2010-05-05 NOTE — Telephone Encounter (Signed)
Pt usually gets Cymbalta auto refilled but pharmacy called and said a doctor that she does not know faxed and said it could not be refilled-----pt would like to know if this is a mistake and would like to know if it can be refilled.  Please advise

## 2010-05-06 ENCOUNTER — Other Ambulatory Visit: Payer: Self-pay

## 2010-05-06 MED ORDER — DULOXETINE HCL 60 MG PO CPEP: 60.0000 mg | ORAL_CAPSULE | Freq: Every day | ORAL | Status: DC

## 2010-05-06 NOTE — Telephone Encounter (Signed)
refilled 

## 2010-06-13 NOTE — Op Note (Signed)
NAME:  Shannon Lynch, Shannon Lynch                        ACCOUNT NO.:  [id number]   MEDICAL RECORD NO.:  [id number]                   PATIENT TYPE:  AMB   LOCATION:  ENDO                                 FACILITY:  [facility]   PHYSICIAN:  Georgiana Spinner, M.D.                 DATE OF BIRTH:  1945-08-31   DATE OF PROCEDURE:  10/15/2003  DATE OF DISCHARGE:                                 OPERATIVE REPORT   PROCEDURE:  Colonoscopy.   INDICATIONS:  Colon cancer screening.   ANESTHESIA:  Demerol 75, Versed 8 mg.   DESCRIPTION OF PROCEDURE:  With patient mildly sedated in the left lateral  decubitus position, the Olympus videoscopic colonoscope was inserted in the  rectum and passed under direct vision to the cecum, identified by the  ileocecal valve and appendiceal orifice, both of which were photographed.  From this point, the colonoscope was slowly withdrawn, taking  circumferential views of the colonic mucosa, stopping only in the rectum  which appeared normal on direct and showed hemorrhoids on retroflexed view.  The endoscope was straightened, withdrawn.  The patient's vital signs and  pulse oximeter remained stable.  The patient tolerated the procedure well  without apparent complications.   FINDINGS:  Internal hemorrhoids.  Otherwise, unremarkable examination.      GMO/MEDQ  D:  10/15/2003  T:  10/15/2003  Job:  [id number]

## 2010-06-25 ENCOUNTER — Ambulatory Visit (INDEPENDENT_AMBULATORY_CARE_PROVIDER_SITE_OTHER): Payer: BC Managed Care – PPO | Admitting: Family Medicine

## 2010-06-25 ENCOUNTER — Other Ambulatory Visit (INDEPENDENT_AMBULATORY_CARE_PROVIDER_SITE_OTHER): Payer: BC Managed Care – PPO

## 2010-06-25 ENCOUNTER — Encounter: Payer: Self-pay | Admitting: Family Medicine

## 2010-06-25 VITALS — BP 112/74 | HR 57 | Temp 98.0°F | Wt 232.0 lb

## 2010-06-25 DIAGNOSIS — E6609 Other obesity due to excess calories: Secondary | ICD-10-CM

## 2010-06-25 DIAGNOSIS — F419 Anxiety disorder, unspecified: Secondary | ICD-10-CM

## 2010-06-25 DIAGNOSIS — M545 Low back pain, unspecified: Secondary | ICD-10-CM

## 2010-06-25 DIAGNOSIS — M129 Arthropathy, unspecified: Secondary | ICD-10-CM

## 2010-06-25 DIAGNOSIS — F329 Major depressive disorder, single episode, unspecified: Secondary | ICD-10-CM

## 2010-06-25 DIAGNOSIS — N390 Urinary tract infection, site not specified: Secondary | ICD-10-CM

## 2010-06-25 DIAGNOSIS — E669 Obesity, unspecified: Secondary | ICD-10-CM

## 2010-06-25 DIAGNOSIS — F341 Dysthymic disorder: Secondary | ICD-10-CM

## 2010-06-25 DIAGNOSIS — M1990 Unspecified osteoarthritis, unspecified site: Secondary | ICD-10-CM

## 2010-06-25 DIAGNOSIS — R0602 Shortness of breath: Secondary | ICD-10-CM

## 2010-06-25 LAB — POCT URINALYSIS DIPSTICK
Bilirubin, UA: NEGATIVE
Blood, UA: NEGATIVE
Ketones, UA: NEGATIVE
Spec Grav, UA: 1.03
pH, UA: 5.5

## 2010-06-25 MED ORDER — PHENAZOPYRIDINE HCL 200 MG PO TABS: 200.0000 mg | ORAL_TABLET | Freq: Three times a day (TID) | ORAL | Status: DC | PRN

## 2010-06-25 MED ORDER — LEVOFLOXACIN 500 MG PO TABS: 500.0000 mg | ORAL_TABLET | Freq: Every day | ORAL | Status: DC

## 2010-07-06 ENCOUNTER — Encounter: Payer: Self-pay | Admitting: Family Medicine

## 2010-07-06 NOTE — Progress Notes (Signed)
  Subjective:    Patient ID: Shannon Lynch, female    DOB: 03-06-1945, 65 y.o.   MRN: [id number] Deceased 65-year-old white married female he complained of dysuria urinary frequency urgency as well as occasionally bled after she urinates she has a kidney stone in December but has no pain at this time urine or pain again significant that she is taking peridium t.i.d. With some early patient continues to have anxiety stress and some depression and takes Tranxene Celexa and Cymbalta also has arthritis for which she takes Mobic 15 mg q. Day Continues to have a difficult time losing weight HPI    Review of Systems no other symptoms see present illness     Objective:   Physical Exampatient is a well-developed well-nourished obese white female who appears uncomfortable and are anxious but cooperative Heart and lung examination revealed no acute problems 1+ pretibial edema        Assessment & Plan:  Acute cystitis or urinary tract infection treated with Levaquin plus peridium. Anxiety depression continue regular medications Hypertension control 112/74 continue regular meds Arthritis continue Mobic 15 mg each day Continued to recommend weight reduction

## 2010-07-06 NOTE — Patient Instructions (Signed)
Despite the fact that her urinalysis is negative at this time with her symptoms and past history of feeling nausea treated for acute urinary tract infection and requested she be a urinalysis and finished in the medication Return in one month for your physical examination for which you are scheduled

## 2010-09-03 ENCOUNTER — Other Ambulatory Visit: Payer: Self-pay | Admitting: Family Medicine

## 2010-09-05 ENCOUNTER — Other Ambulatory Visit: Payer: Self-pay | Admitting: Family Medicine

## 2010-10-07 ENCOUNTER — Other Ambulatory Visit: Payer: Self-pay

## 2010-10-07 MED ORDER — CLORAZEPATE DIPOTASSIUM 3.75 MG PO TABS: 3.7500 mg | ORAL_TABLET | Freq: Three times a day (TID) | ORAL | Status: DC

## 2010-10-07 NOTE — Telephone Encounter (Signed)
Ok per Dr. Scotty Court to fill clorazepate 3.75 mg 90 x 5 rf.

## 2010-10-21 ENCOUNTER — Other Ambulatory Visit: Payer: Self-pay | Admitting: Family Medicine

## 2010-12-15 ENCOUNTER — Encounter: Payer: Self-pay | Admitting: Internal Medicine

## 2010-12-15 ENCOUNTER — Ambulatory Visit (INDEPENDENT_AMBULATORY_CARE_PROVIDER_SITE_OTHER): Payer: BC Managed Care – PPO | Admitting: Internal Medicine

## 2010-12-15 VITALS — BP 140/80 | HR 109 | Temp 97.4°F | Resp 20 | Wt 234.0 lb

## 2010-12-15 DIAGNOSIS — J40 Bronchitis, not specified as acute or chronic: Secondary | ICD-10-CM

## 2010-12-15 MED ORDER — DOXYCYCLINE HYCLATE 100 MG PO TABS: 100.0000 mg | ORAL_TABLET | Freq: Two times a day (BID) | ORAL | Status: AC

## 2010-12-15 MED ORDER — ALBUTEROL 90 MCG/ACT IN AERS: 2.0000 | INHALATION_SPRAY | Freq: Four times a day (QID) | RESPIRATORY_TRACT | Status: DC | PRN

## 2010-12-17 DIAGNOSIS — J40 Bronchitis, not specified as acute or chronic: Secondary | ICD-10-CM | POA: Insufficient documentation

## 2010-12-17 NOTE — Progress Notes (Signed)
  Subjective:    Patient ID: Shannon Lynch, female    DOB: [birth year], 65 y.o.   MRN: [id number]  HPI Pt presents to clinic for evaluation of cough. Notes 5d h/o earache, chest congestion and cough productive for clear sputum without hemoptysis. Has subjective wheezing without dyspnea. Attempting mucinex without significant improvement. No other alleviating or exacerbating factors. No other complaints.  Past Medical History  Diagnosis Date  . Hypertension   . Exogenous obesity   . Arthritis   . Anxiety   . Depression    Past Surgical History  Procedure Date  . Total knee arthroplasty     reports that she has quit smoking. She has never used smokeless tobacco. She reports that she does not drink alcohol or use illicit drugs. family history is not on file. Allergies  Allergen Reactions  . Ciprofloxacin   . Codeine        Review of Systems see hpi     Objective:   Physical Exam  Nursing note and vitals reviewed. Constitutional: She appears well-developed and well-nourished.  HENT:  Head: Normocephalic and atraumatic.  Right Ear: External ear normal.  Left Ear: External ear normal.  Nose: Nose normal.  Mouth/Throat: Oropharynx is clear and moist. No oropharyngeal exudate.  Eyes: Conjunctivae are normal. No scleral icterus.  Neck: Neck supple.  Cardiovascular: Normal rate, regular rhythm and normal heart sounds.   Pulmonary/Chest: Effort normal and breath sounds normal. No respiratory distress. She has no wheezes. She has no rales.  Lymphadenopathy:    She has no cervical adenopathy.  Neurological: She is alert.  Skin: Skin is warm and dry.  Psychiatric: She has a normal mood and affect.          Assessment & Plan:

## 2010-12-17 NOTE — Assessment & Plan Note (Signed)
Attempt doxycycline. 7days. Albuterol mdi prn. Followup if no improvement or worsening.

## 2011-01-01 ENCOUNTER — Encounter: Payer: BC Managed Care – PPO | Admitting: Internal Medicine

## 2011-02-06 ENCOUNTER — Inpatient Hospital Stay (HOSPITAL_COMMUNITY)
Admission: EM | Admit: 2011-02-06 | Discharge: 2011-02-10 | DRG: 294 | Disposition: A | Discharge status: Home / Self Care | Payer: BC Managed Care – PPO | Attending: Internal Medicine | Admitting: Internal Medicine

## 2011-02-06 ENCOUNTER — Encounter (HOSPITAL_COMMUNITY): Payer: Self-pay | Admitting: Internal Medicine

## 2011-02-06 DIAGNOSIS — J4489 Other specified chronic obstructive pulmonary disease: Secondary | ICD-10-CM | POA: Diagnosis present

## 2011-02-06 DIAGNOSIS — E669 Obesity, unspecified: Secondary | ICD-10-CM | POA: Diagnosis present

## 2011-02-06 DIAGNOSIS — E1100 Type 2 diabetes mellitus with hyperosmolarity without nonketotic hyperglycemic-hyperosmolar coma (NKHHC): Principal | ICD-10-CM | POA: Diagnosis present

## 2011-02-06 DIAGNOSIS — S4291XA Fracture of right shoulder girdle, part unspecified, initial encounter for closed fracture: Secondary | ICD-10-CM | POA: Diagnosis present

## 2011-02-06 DIAGNOSIS — N23 Unspecified renal colic: Secondary | ICD-10-CM | POA: Diagnosis present

## 2011-02-06 DIAGNOSIS — M545 Low back pain: Secondary | ICD-10-CM

## 2011-02-06 DIAGNOSIS — J40 Bronchitis, not specified as acute or chronic: Secondary | ICD-10-CM

## 2011-02-06 DIAGNOSIS — E119 Type 2 diabetes mellitus without complications: Secondary | ICD-10-CM | POA: Diagnosis present

## 2011-02-06 DIAGNOSIS — J449 Chronic obstructive pulmonary disease, unspecified: Secondary | ICD-10-CM | POA: Diagnosis present

## 2011-02-06 DIAGNOSIS — F3289 Other specified depressive episodes: Secondary | ICD-10-CM | POA: Diagnosis present

## 2011-02-06 DIAGNOSIS — F411 Generalized anxiety disorder: Secondary | ICD-10-CM | POA: Diagnosis present

## 2011-02-06 DIAGNOSIS — Z4789 Encounter for other orthopedic aftercare: Secondary | ICD-10-CM

## 2011-02-06 DIAGNOSIS — N390 Urinary tract infection, site not specified: Secondary | ICD-10-CM | POA: Diagnosis present

## 2011-02-06 DIAGNOSIS — F341 Dysthymic disorder: Secondary | ICD-10-CM | POA: Diagnosis present

## 2011-02-06 DIAGNOSIS — I10 Essential (primary) hypertension: Secondary | ICD-10-CM | POA: Diagnosis present

## 2011-02-06 DIAGNOSIS — Z96659 Presence of unspecified artificial knee joint: Secondary | ICD-10-CM

## 2011-02-06 DIAGNOSIS — F329 Major depressive disorder, single episode, unspecified: Secondary | ICD-10-CM | POA: Diagnosis present

## 2011-02-06 DIAGNOSIS — E1110 Type 2 diabetes mellitus with ketoacidosis without coma: Secondary | ICD-10-CM

## 2011-02-06 DIAGNOSIS — E860 Dehydration: Secondary | ICD-10-CM | POA: Diagnosis present

## 2011-02-06 LAB — CBC
HCT: 47.2 % — ABNORMAL HIGH (ref 36.0–46.0)
Hemoglobin: 15.9 g/dL — ABNORMAL HIGH (ref 12.0–15.0)
Platelets: 459 10*3/uL — ABNORMAL HIGH (ref 150–400)
RBC: 5.2 MIL/uL — ABNORMAL HIGH (ref 3.87–5.11)
WBC: 16.4 10*3/uL — ABNORMAL HIGH (ref 4.0–10.5)

## 2011-02-06 LAB — BASIC METABOLIC PANEL
BUN: 7 mg/dL (ref 6–23)
BUN: 6 mg/dL (ref 6–23)
Calcium: 11.2 mg/dL — ABNORMAL HIGH (ref 8.4–10.5)
Creatinine, Ser: 0.58 mg/dL (ref 0.50–1.10)
GFR calc Af Amer: >90 mL/min (ref >90)
GFR calc Af Amer: >90 mL/min (ref >90)
GFR calc non Af Amer: 86 mL/min — ABNORMAL LOW (ref >90)
GFR calc non Af Amer: >90 mL/min (ref >90)
Glucose, Bld: 670 mg/dL (ref 70–99)
Potassium: 3.5 mEq/L (ref 3.5–5.1)
Sodium: 128 mEq/L — ABNORMAL LOW (ref 135–145)

## 2011-02-06 LAB — DIFFERENTIAL
Basophils Relative: 0 % (ref 0–1)
Eosinophils Absolute: 0.1 10*3/uL (ref 0.0–0.7)
Eosinophils Relative: 0 % (ref 0–5)
Monocytes Relative: 4 % (ref 3–12)
Neutrophils Relative %: 78 % — ABNORMAL HIGH (ref 43–77)

## 2011-02-06 LAB — URINALYSIS, ROUTINE W REFLEX MICROSCOPIC
Bilirubin Urine: NEGATIVE
Hgb urine dipstick: NEGATIVE
Nitrite: NEGATIVE
Protein, ur: NEGATIVE mg/dL
Specific Gravity, Urine: 1.037 — ABNORMAL HIGH (ref 1.005–1.030)
Urobilinogen, UA: 0.2 mg/dL (ref 0.0–1.0)

## 2011-02-06 LAB — BLOOD GAS, VENOUS
Acid-Base Excess: 2.9 mmol/L — ABNORMAL HIGH (ref 0.0–2.0)
TCO2: 25.7 mmol/L (ref 0–100)
pCO2, Ven: 50.9 mmHg — ABNORMAL HIGH (ref 45.0–50.0)

## 2011-02-06 LAB — CARDIAC PANEL(CRET KIN+CKTOT+MB+TROPI): Troponin I: <0.3 ng/mL (ref <0.30)

## 2011-02-06 LAB — GLUCOSE, CAPILLARY: Glucose-Capillary: >600 mg/dL (ref 70–99)

## 2011-02-06 MED ORDER — SODIUM CHLORIDE 0.9 % IV BOLUS (SEPSIS)
1000.0000 mL | Freq: Once | INTRAVENOUS | Status: AC
  Administered 2011-02-06: 1000 mL via INTRAVENOUS

## 2011-02-06 MED ORDER — INSULIN ASPART 100 UNIT/ML ~~LOC~~ SOLN
6.0000 [IU] | Freq: Three times a day (TID) | SUBCUTANEOUS | Status: DC
  Administered 2011-02-07 – 2011-02-10 (×11): 6 [IU] via SUBCUTANEOUS

## 2011-02-06 MED ORDER — SODIUM CHLORIDE 0.9 % IV SOLN
INTRAVENOUS | Status: DC
  Filled 2011-02-06: qty 1

## 2011-02-06 MED ORDER — INSULIN ASPART 100 UNIT/ML ~~LOC~~ SOLN
0.0000 [IU] | Freq: Every day | SUBCUTANEOUS | Status: DC
  Administered 2011-02-08: 2 [IU] via SUBCUTANEOUS

## 2011-02-06 MED ORDER — ONDANSETRON HCL 4 MG/2ML IJ SOLN
4.0000 mg | Freq: Once | INTRAMUSCULAR | Status: AC
  Administered 2011-02-06: 4 mg via INTRAVENOUS
  Filled 2011-02-06: qty 2

## 2011-02-06 MED ORDER — DEXTROSE-NACL 5-0.45 % IV SOLN: INTRAVENOUS | Status: DC

## 2011-02-06 MED ORDER — INSULIN ASPART 100 UNIT/ML ~~LOC~~ SOLN
0.0000 [IU] | Freq: Three times a day (TID) | SUBCUTANEOUS | Status: DC
  Administered 2011-02-07: 4 [IU] via SUBCUTANEOUS
  Administered 2011-02-07: 11 [IU] via SUBCUTANEOUS
  Administered 2011-02-07: 20 [IU] via SUBCUTANEOUS
  Administered 2011-02-08: 4 [IU] via SUBCUTANEOUS
  Administered 2011-02-08: 11 [IU] via SUBCUTANEOUS
  Administered 2011-02-08 – 2011-02-09 (×3): 7 [IU] via SUBCUTANEOUS
  Administered 2011-02-09 – 2011-02-10 (×3): 4 [IU] via SUBCUTANEOUS
  Administered 2011-02-10: 7 [IU] via SUBCUTANEOUS
  Filled 2011-02-06 (×2): qty 3

## 2011-02-06 MED ORDER — DEXTROSE 50 % IV SOLN: 25.0000 mL | INTRAVENOUS | Status: DC | PRN

## 2011-02-06 MED ORDER — POTASSIUM CHLORIDE 10 MEQ/100ML IV SOLN: 10.0000 meq | INTRAVENOUS | Status: DC

## 2011-02-06 MED ORDER — SODIUM CHLORIDE 0.9 % IV SOLN: INTRAVENOUS | Status: DC

## 2011-02-06 NOTE — ED Notes (Signed)
Called lab. Blood will be run soon.

## 2011-02-06 NOTE — ED Notes (Signed)
BS: 670 PER LAB.

## 2011-02-06 NOTE — H&P (Signed)
PCP:   Estill Cotta, MD   Chief Complaint: Elevated blood glucose.   HPI: Shannon Lynch is an 66 y.o. female with history of obesity, hypertension, anxiety and depression, presents to her primary care physician and was found to have elevated blood glucose. It should be noted that she was not diagnosed with diabetes prior to today. She was sent to the emergency room and was confirmed that her blood glucose was 600. She has been experiencing symptoms of hypoglycemia including intermittent blurry vision, polydipsia, polyuria, and polyphagia. She denied any chest pain or shortness of breath. She has had no abdominal cramps or pain. She was given diuretic as an augmentation (reportedly by her) to her blood pressure medication. She was found to have hyperosmolar nonketotic hyperglycemia with a bicarbonate of 25, and a potassium of 3.5, blood sugar is 670 and concomitant serum sodium of 128. She was initially started on DKA glucose stabilizer, but with 1 L of fluid, her blood glucose dropped down to the 300s. Also, she was recently fell and had a nonsurgical right shoulder fracture, evaluated by orthopedics, and it isnow in a sling. Hospitalist was asked to admit her for evaluation of hyperglycemia.  Rewiew of Systems:  The patient denies anorexia, fever, weight loss,, vision loss, decreased hearing, hoarseness, chest pain, syncope, dyspnea on exertion, peripheral edema, balance deficits, hemoptysis, abdominal pain, melena, hematochezia, severe indigestion/heartburn, hematuria, incontinence, genital sores, muscle weakness, suspicious skin lesions, transient blindness, difficulty walking, depression, unusual weight change, abnormal bleeding, enlarged lymph nodes, angioedema, and breast masses.    Past Medical History  Diagnosis Date  . Hypertension   . Exogenous obesity   . Arthritis   . Anxiety   . Depression     Past Surgical History  Procedure Date  . Total knee arthroplasty      Medications:  HOME MEDS: Prior to Admission medications   Medication Sig Start Date End Date Taking? Authorizing Provider  acetaminophen (TYLENOL) 500 MG tablet Take 1,000 mg by mouth every 6 (six) hours as needed. pain   Yes Historical Provider, MD  albuterol (PROVENTIL,VENTOLIN) 90 MCG/ACT inhaler Inhale 2 puffs into the lungs every 6 (six) hours as needed for wheezing. 12/15/10 12/10/11 Yes Thomas Whitson [employer].  cholecalciferol (VITAMIN D) 1000 UNITS tablet Take 1,000 Units by mouth daily.   Yes Historical Provider, MD  citalopram (CELEXA) 40 MG tablet TAKE 1 TABLET BY MOUTH EVERY DAY 10/21/10  Yes Willie Ransom Alphonzo [facility].  clorazepate (TRANXENE) 3.75 MG tablet Take 1 tablet (3.75 mg total) by mouth 3 (three) times daily. 10/07/10  Yes Willie Minus Breeding.  DULoxetine (CYMBALTA) 60 MG capsule Take 1 capsule (60 mg total) by mouth daily. 05/06/10 05/06/11 Yes Willie Ransom Alphonzo Severance.  furosemide (LASIX) 20 MG tablet Take 20 mg by mouth 2 (two) times daily.     Yes Historical Provider, MD  HYDROmorphone (DILAUDID) 2 MG tablet Take 2 mg by mouth every 4 (four) hours as needed. pain   Yes Historical Provider, MD  KLOR-CON M20 20 MEQ tablet TAKE 1 TABLET BY MOUTH EVERY DAY 09/05/10  Yes Willie Ransom Alphonzo Severance.  losartan (COZAAR) 50 MG tablet Take 50 mg by mouth daily.     Yes Historical Provider, MD  Naproxen-Esomeprazole (VIMOVO PO) Take by mouth daily.     Yes Historical Provider, MD  vitamin C (ASCORBIC ACID) 500 MG tablet Take 500 mg by mouth daily.   Yes Historical Provider, MD     Allergies:  Allergies  Allergen Reactions  . Ciprofloxacin Hives  . Codeine Nausea And Vomiting    Social History:   reports that she has quit smoking. She has never used smokeless tobacco. She reports that she does not drink alcohol or use illicit drugs. she is married, has no children.  Family History: No family history on file. she has no family history of diabetes.   Physical  Exam: Filed Vitals:   02/06/11 1827 02/06/11 2230 02/06/11 2300 02/06/11 2334  BP: 129/72 156/74 154/74 157/65  Pulse: 117   98  Temp: 97.7 F (36.5 C)     TempSrc: Oral     Resp: 18   18  Height: 5\' 3"  (1.6 m)     Weight: 99.338 kg (219 lb)     SpO2: 98%   95%   Blood pressure 157/65, pulse 98, temperature 97.7 F (36.5 C), temperature source Oral, resp. rate 18, height 5\' 3"  (1.6 m), weight 99.338 kg (219 lb), SpO2 95.00%.  GEN:  Pleasant  person lying in the stretcher in no acute distress; cooperative with exam PSYCH:  alert and oriented x4; does not appear anxious does not appear depressed; affect is normal HEENT: Mucous membranes pink and anicteric; PERRLA; EOM intact; no cervical lymphadenopathy nor thyromegaly or carotid bruit; no JVD; Breasts:: Not examined CHEST WALL: No tenderness CHEST: Normal respiration, clear to auscultation bilaterally HEART: Regular rate and rhythm; no murmurs rubs or gallops BACK: No kyphosis or scoliosis; no CVA tenderness ABDOMEN: Obese, soft non-tender; no masses, no organomegaly, normal abdominal bowel sounds; no pannus; no intertriginous candida. Rectal Exam: Not done EXTREMITIES: No bone or joint deformity; age-appropriate arthropathy of the hands and knees; no edema; no ulcerations. She has a fracture right shoulder, nonsurgical, evaluated by orthopedics, and is in a sling.  Genitalia: not examined PULSES: 2+ and symmetric SKIN: Normal hydration no rash or ulceration CNS: Cranial nerves 2-12 grossly intact no focal neurologic deficit   Labs & Imaging Results for orders placed during the hospital encounter of 02/06/11 (from the past 48 hour(s))  GLUCOSE, CAPILLARY     Status: Abnormal   Collection Time   02/06/11  6:30 PM      Component Value Range Comment   Glucose-Capillary >600 (*) 70 - 99 (mg/dL)   URINALYSIS, ROUTINE W REFLEX MICROSCOPIC     Status: Abnormal   Collection Time   02/06/11  6:53 PM      Component Value Range Comment    Color, Urine YELLOW  YELLOW     APPearance CLEAR  CLEAR     Specific Gravity, Urine 1.037 (*) 1.005 - 1.030     pH 6.0  5.0 - 8.0     Glucose, UA >1000 (*) NEGATIVE (mg/dL)    Hgb urine dipstick NEGATIVE  NEGATIVE     Bilirubin Urine NEGATIVE  NEGATIVE     Ketones, ur NEGATIVE  NEGATIVE (mg/dL)    Protein, ur NEGATIVE  NEGATIVE (mg/dL)    Urobilinogen, UA 0.2  0.0 - 1.0 (mg/dL)    Nitrite NEGATIVE  NEGATIVE     Leukocytes, UA NEGATIVE  NEGATIVE    URINE MICROSCOPIC-ADD ON     Status: Normal   Collection Time   02/06/11  6:53 PM      Component Value Range Comment   Squamous Epithelial / LPF RARE  RARE    CBC     Status: Abnormal   Collection Time   02/06/11  7:00 PM      Component  Value Range Comment   WBC 16.4 (*) 4.0 - 10.5 (K/uL)    RBC 5.20 (*) 3.87 - 5.11 (MIL/uL)    Hemoglobin 15.9 (*) 12.0 - 15.0 (g/dL)    HCT 16.1 (*) 09.6 - 46.0 (%)    MCV 90.8  78.0 - 100.0 (fL)    MCH 30.6  26.0 - 34.0 (pg)    MCHC 33.7  30.0 - 36.0 (g/dL)    RDW 04.5  40.9 - 81.1 (%)    Platelets 459 (*) 150 - 400 (K/uL)   DIFFERENTIAL     Status: Abnormal   Collection Time   02/06/11  7:00 PM      Component Value Range Comment   Neutrophils Relative 78 (*) 43 - 77 (%)    Neutro Abs 12.7 (*) 1.7 - 7.7 (K/uL)    Lymphocytes Relative 18  12 - 46 (%)    Lymphs Abs 2.9  0.7 - 4.0 (K/uL)    Monocytes Relative 4  3 - 12 (%)    Monocytes Absolute 0.7  0.1 - 1.0 (K/uL)    Eosinophils Relative 0  0 - 5 (%)    Eosinophils Absolute 0.1  0.0 - 0.7 (K/uL)    Basophils Relative 0  0 - 1 (%)    Basophils Absolute 0.1  0.0 - 0.1 (K/uL)   BASIC METABOLIC PANEL     Status: Abnormal   Collection Time   02/06/11  7:00 PM      Component Value Range Comment   Sodium 128 (*) 135 - 145 (mEq/L)    Potassium 3.5  3.5 - 5.1 (mEq/L)    Chloride 84 (*) 96 - 112 (mEq/L)    CO2 25  19 - 32 (mEq/L)    Glucose, Bld 670 (*) 70 - 99 (mg/dL)    BUN 7  6 - 23 (mg/dL)    Creatinine, Ser 9.14  0.50 - 1.10 (mg/dL)    Calcium  78.2 (*) 8.4 - 10.5 (mg/dL)    GFR calc non Af Amer 86 (*) >90 (mL/min)    GFR calc Af Amer >90  >90 (mL/min)   BASIC METABOLIC PANEL     Status: Abnormal   Collection Time   02/06/11 10:10 PM      Component Value Range Comment   Sodium 134 (*) 135 - 145 (mEq/L)    Potassium 3.6  3.5 - 5.1 (mEq/L)    Chloride 96  96 - 112 (mEq/L)    CO2 25  19 - 32 (mEq/L)    Glucose, Bld 394 (*) 70 - 99 (mg/dL)    BUN 6  6 - 23 (mg/dL)    Creatinine, Ser 9.56  0.50 - 1.10 (mg/dL)    Calcium 9.6  8.4 - 10.5 (mg/dL)    GFR calc non Af Amer >90  >90 (mL/min)    GFR calc Af Amer >90  >90 (mL/min)   GLUCOSE, CAPILLARY     Status: Abnormal   Collection Time   02/06/11 10:43 PM      Component Value Range Comment   Glucose-Capillary 375 (*) 70 - 99 (mg/dL)   BLOOD GAS, VENOUS     Status: Abnormal   Collection Time   02/06/11 10:55 PM      Component Value Range Comment   FIO2 0.21      pH, Ven 7.370 (*) 7.250 - 7.300     pCO2, Ven 50.9 (*) 45.0 - 50.0 (mmHg)    pO2, Ven 30.0  30.0 -  45.0 (mmHg)    Bicarbonate 28.7 (*) 20.0 - 24.0 (mEq/L)    TCO2 25.7  0 - 100 (mmol/L)    Acid-Base Excess 2.9 (*) 0.0 - 2.0 (mmol/L)    O2 Saturation 49.1      Patient temperature 98.6      Collection site VEIN      Drawn by COLLECTED BY NURSE      Sample type VEIN     CARDIAC PANEL(CRET KIN+CKTOT+MB+TROPI)     Status: Normal   Collection Time   02/06/11 10:58 PM      Component Value Range Comment   Total CK 50  7 - 177 (U/L)    CK, MB 2.2  0.3 - 4.0 (ng/mL)    Troponin I <0.30  <0.30 (ng/mL)    Relative Index RELATIVE INDEX IS INVALID  0.0 - 2.5     No results found.    Assessment Present on Admission:  .Type 2 diabetes mellitus with hyperosmolar nonketotic hyperglycemia .EXOGENOUS OBESITY .HYPERTENSION .ANXIETY DEPRESSION Fracture right shoulder, evaluated, nonsurgical.  PLAN: She will be admitted to telemetry. She will be given fluids and insulin (she needs more fluid than insulin). She is clearly  hyperosmolar. I will stop her diuretic. When she is better, with persistent normal creatinine, I would start her on Glucophage. Although she did not have any chest pain, we'll rule out with serial CPKs and troponins as well. She will be needing potassium, and we'll continue to monitor. She is stable, full code, and will be admitted to [facility]ad hospitalist service. I will continue her other medications for anxiety, pain, and her blood pressure.   Other plans as per orders.   Matei Magnone 02/06/2011, 11:39 PM

## 2011-02-06 NOTE — ED Provider Notes (Signed)
History     CSN: [id number]  Arrival date & time 02/06/11  1752   First MD Initiated Contact with Patient 02/06/11 1856      Chief Complaint  Patient presents with  . Hyperglycemia    sent by UC in [facility]  New onset.  CBG <400 by MD at [facility].    (Consider location/radiation/quality/duration/timing/severity/associated sxs/prior treatment) HPI Comments: Patient presented to an urgent care today after she has not felt well over the last one to 2 weeks.  She noted that she had some pneumonia in December and felt like she never quite got better.  She did describe some cold symptoms and that she's just been increasingly tired.  She's also had increased thirst and increased urination.  Patient has had some intermittent nausea as well.  On review of systems she also had some diarrhea this week.  Minimal to no abdominal pain.  No fevers.  No chest pain or shortness of breath.  Patient was evaluated in the urgent care today and was found to have hyperglycemia and recommended to come to the emergency department for possible admission to the hospital.  Patient is a 66 y.o. female presenting with weakness. The history is provided by the patient. No language interpreter was used.  Weakness The primary symptoms include dizziness and nausea. Primary symptoms do not include headaches, syncope, loss of consciousness, altered mental status, paresthesias, fever or vomiting. The symptoms began more than 1 week ago. The symptoms are worsening.  Dizziness also occurs with nausea and weakness. Dizziness does not occur with vomiting.  Additional symptoms include weakness.    Past Medical History  Diagnosis Date  . Hypertension   . Exogenous obesity   . Arthritis   . Anxiety   . Depression     Past Surgical History  Procedure Date  . Total knee arthroplasty     No family history on file.  History  Substance Use Topics  . Smoking status: Former Games developer  . Smokeless tobacco: Never Used  . Alcohol  Use: No    OB History    Grav Para Term Preterm Abortions TAB SAB Ect Mult Living                  Review of Systems  Constitutional: Positive for fatigue. Negative for fever and chills.  HENT: Positive for congestion.   Eyes: Negative.  Negative for discharge and redness.  Respiratory: Negative.  Negative for cough and shortness of breath.   Cardiovascular: Negative.  Negative for chest pain and syncope.  Gastrointestinal: Positive for nausea. Negative for vomiting, abdominal pain and diarrhea.  Genitourinary: Positive for frequency. Negative for dysuria and vaginal discharge.  Musculoskeletal: Negative.  Negative for back pain.  Skin: Negative.  Negative for color change and rash.  Neurological: Positive for dizziness and weakness. Negative for loss of consciousness, syncope, headaches and paresthesias.  Hematological: Negative.  Negative for adenopathy.  Psychiatric/Behavioral: Negative.  Negative for confusion and altered mental status.  All other systems reviewed and are negative.    Allergies  Ciprofloxacin and Codeine  Home Medications   Current Outpatient Rx  Name Route Sig Dispense Refill  . ACETAMINOPHEN 500 MG PO TABS Oral Take 1,000 mg by mouth every 6 (six) hours as needed. pain    . ALBUTEROL 90 MCG/ACT IN AERS Inhalation Inhale 2 puffs into the lungs every 6 (six) hours as needed for wheezing. 17 g 0  . VITAMIN D 1000 UNITS PO TABS Oral Take 1,000 Units  by mouth daily.    Marland Kitchen CITALOPRAM HYDROBROMIDE 40 MG PO TABS  TAKE 1 TABLET BY MOUTH EVERY DAY 30 tablet 5  . CLORAZEPATE DIPOTASSIUM 3.75 MG PO TABS Oral Take 1 tablet (3.75 mg total) by mouth 3 (three) times daily. 90 tablet 5  . DULOXETINE HCL 60 MG PO CPEP Oral Take 1 capsule (60 mg total) by mouth daily. 30 capsule 11  . FUROSEMIDE 20 MG PO TABS Oral Take 20 mg by mouth 2 (two) times daily.      Marland Kitchen HYDROMORPHONE HCL 2 MG PO TABS Oral Take 2 mg by mouth every 4 (four) hours as needed. pain    . KLOR-CON M20 20  MEQ PO TBCR  TAKE 1 TABLET BY MOUTH EVERY DAY 30 tablet 5  . LOSARTAN POTASSIUM 50 MG PO TABS Oral Take 50 mg by mouth daily.      Marland Kitchen VIMOVO PO Oral Take by mouth daily.      Marland Kitchen VITAMIN C 500 MG PO TABS Oral Take 500 mg by mouth daily.      BP 129/72  Pulse 117  Temp(Src) 97.7 F (36.5 C) (Oral)  Resp 18  Ht 5\' 3"  (1.6 m)  Wt 219 lb (99.338 kg)  BMI 38.79 kg/m2  SpO2 98%  Physical Exam  Nursing note and vitals reviewed. Constitutional: She is oriented to person, place, and time. She appears well-developed and well-nourished.  Non-toxic appearance. She does not have a sickly appearance.  HENT:  Head: Normocephalic and atraumatic.  Eyes: Conjunctivae, EOM and lids are normal. Pupils are equal, round, and reactive to light. No scleral icterus.  Neck: Trachea normal and normal range of motion. Neck supple.  Cardiovascular: Regular rhythm, S1 normal, S2 normal and normal heart sounds.  Tachycardia present.   Pulmonary/Chest: Effort normal and breath sounds normal.  Abdominal: Soft. Normal appearance. There is no tenderness. There is no rebound, no guarding and no CVA tenderness.  Musculoskeletal: Normal range of motion.  Neurological: She is alert and oriented to person, place, and time. She has normal strength.  Skin: Skin is warm, dry and intact. No rash noted.  Psychiatric: She has a normal mood and affect. Her behavior is normal. Judgment and thought content normal.    ED Course  Procedures (including critical care time)  Results for orders placed during the hospital encounter of 02/06/11  GLUCOSE, CAPILLARY      Component Value Range   Glucose-Capillary >600 (*) 70 - 99 (mg/dL)  CBC      Component Value Range   WBC 16.4 (*) 4.0 - 10.5 (K/uL)   RBC 5.20 (*) 3.87 - 5.11 (MIL/uL)   Hemoglobin 15.9 (*) 12.0 - 15.0 (g/dL)   HCT 78.2 (*) 95.6 - 46.0 (%)   MCV 90.8  78.0 - 100.0 (fL)   MCH 30.6  26.0 - 34.0 (pg)   MCHC 33.7  30.0 - 36.0 (g/dL)   RDW 21.3  08.6 - 57.8 (%)    Platelets 459 (*) 150 - 400 (K/uL)  DIFFERENTIAL      Component Value Range   Neutrophils Relative 78 (*) 43 - 77 (%)   Neutro Abs 12.7 (*) 1.7 - 7.7 (K/uL)   Lymphocytes Relative 18  12 - 46 (%)   Lymphs Abs 2.9  0.7 - 4.0 (K/uL)   Monocytes Relative 4  3 - 12 (%)   Monocytes Absolute 0.7  0.1 - 1.0 (K/uL)   Eosinophils Relative 0  0 - 5 (%)   Eosinophils  Absolute 0.1  0.0 - 0.7 (K/uL)   Basophils Relative 0  0 - 1 (%)   Basophils Absolute 0.1  0.0 - 0.1 (K/uL)  BASIC METABOLIC PANEL      Component Value Range   Sodium 128 (*) 135 - 145 (mEq/L)   Potassium 3.5  3.5 - 5.1 (mEq/L)   Chloride 84 (*) 96 - 112 (mEq/L)   CO2 25  19 - 32 (mEq/L)   Glucose, Bld 670 (*) 70 - 99 (mg/dL)   BUN 7  6 - 23 (mg/dL)   Creatinine, Ser 9.60  0.50 - 1.10 (mg/dL)   Calcium 45.4 (*) 8.4 - 10.5 (mg/dL)   GFR calc non Af Amer 86 (*) >90 (mL/min)   GFR calc Af Amer >90  >90 (mL/min)  URINALYSIS, ROUTINE W REFLEX MICROSCOPIC      Component Value Range   Color, Urine YELLOW  YELLOW    APPearance CLEAR  CLEAR    Specific Gravity, Urine 1.037 (*) 1.005 - 1.030    pH 6.0  5.0 - 8.0    Glucose, UA >1000 (*) NEGATIVE (mg/dL)   Hgb urine dipstick NEGATIVE  NEGATIVE    Bilirubin Urine NEGATIVE  NEGATIVE    Ketones, ur NEGATIVE  NEGATIVE (mg/dL)   Protein, ur NEGATIVE  NEGATIVE (mg/dL)   Urobilinogen, UA 0.2  0.0 - 1.0 (mg/dL)   Nitrite NEGATIVE  NEGATIVE    Leukocytes, UA NEGATIVE  NEGATIVE   URINE MICROSCOPIC-ADD ON      Component Value Range   Squamous Epithelial / LPF RARE  RARE        MDM  Patient with new onset diabetes with DKA.  She does have an anion gap of 19.  An 8 glucose greater than 600.  There are no ketones in her urine so her acidosis appears to be relatively mild.  In discussion with Dr. Conley Rolls from the hospitalist service we'll obtain a venous blood gas.  Also cardiac markers will be added as a possible cause for the patient's new onset diabetes in DKA.  Patient has had an insulin drip  initiated via the DKA order set.  Patient has been maintained n.p.o. except ice chips and a small amount of water.  She will have her potassium replenished per the DKA order set protocol.  She is otherwise hemodynamically stable for the floor.  She is going to [facility] team 5.        Nat Christen, MD 02/06/11 2226

## 2011-02-07 DIAGNOSIS — S4291XA Fracture of right shoulder girdle, part unspecified, initial encounter for closed fracture: Secondary | ICD-10-CM | POA: Diagnosis present

## 2011-02-07 LAB — CBC
MCH: 29.8 pg (ref 26.0–34.0)
MCHC: 33.2 g/dL (ref 30.0–36.0)
Platelets: 346 10*3/uL (ref 150–400)
RDW: 12.7 % (ref 11.5–15.5)

## 2011-02-07 LAB — HEMOGLOBIN A1C
Hgb A1c MFr Bld: 11.9 % — ABNORMAL HIGH (ref <5.7)
Mean Plasma Glucose: 295 mg/dL — ABNORMAL HIGH (ref <117)

## 2011-02-07 LAB — CARDIAC PANEL(CRET KIN+CKTOT+MB+TROPI)
Relative Index: INVALID (ref 0.0–2.5)
Total CK: 57 U/L (ref 7–177)
Total CK: 60 U/L (ref 7–177)

## 2011-02-07 LAB — GLUCOSE, CAPILLARY
Glucose-Capillary: 353 mg/dL — ABNORMAL HIGH (ref 70–99)
Glucose-Capillary: 354 mg/dL — ABNORMAL HIGH (ref 70–99)
Glucose-Capillary: 269 mg/dL — ABNORMAL HIGH (ref 70–99)
Glucose-Capillary: 185 mg/dL — ABNORMAL HIGH (ref 70–99)
Glucose-Capillary: 186 mg/dL — ABNORMAL HIGH (ref 70–99)

## 2011-02-07 LAB — BASIC METABOLIC PANEL
BUN: 5 mg/dL — ABNORMAL LOW (ref 6–23)
Calcium: 9.6 mg/dL (ref 8.4–10.5)
Calcium: 9.5 mg/dL (ref 8.4–10.5)
Creatinine, Ser: 0.57 mg/dL (ref 0.50–1.10)
GFR calc Af Amer: >90 mL/min (ref >90)
GFR calc Af Amer: >90 mL/min (ref >90)
GFR calc non Af Amer: >90 mL/min (ref >90)
GFR calc non Af Amer: >90 mL/min (ref >90)
GFR calc non Af Amer: >90 mL/min (ref >90)
Potassium: 3.6 mEq/L (ref 3.5–5.1)
Sodium: 133 mEq/L — ABNORMAL LOW (ref 135–145)
Sodium: 135 mEq/L (ref 135–145)

## 2011-02-07 MED ORDER — CLORAZEPATE DIPOTASSIUM 3.75 MG PO TABS
3.7500 mg | ORAL_TABLET | Freq: Three times a day (TID) | ORAL | Status: DC
  Administered 2011-02-08 – 2011-02-10 (×8): 3.75 mg via ORAL
  Filled 2011-02-07 (×10): qty 1

## 2011-02-07 MED ORDER — INSULIN ASPART 100 UNIT/ML ~~LOC~~ SOLN: 4.0000 [IU] | Freq: Three times a day (TID) | SUBCUTANEOUS | Status: DC

## 2011-02-07 MED ORDER — POTASSIUM CHLORIDE IN NACL 20-0.9 MEQ/L-% IV SOLN
INTRAVENOUS | Status: DC
  Administered 2011-02-07 (×2): via INTRAVENOUS
  Filled 2011-02-07 (×4): qty 1000

## 2011-02-07 MED ORDER — ENOXAPARIN SODIUM 40 MG/0.4ML ~~LOC~~ SOLN
40.0000 mg | SUBCUTANEOUS | Status: DC
  Administered 2011-02-08 – 2011-02-09 (×2): 40 mg via SUBCUTANEOUS
  Filled 2011-02-07 (×5): qty 0.4

## 2011-02-07 MED ORDER — VITAMIN C 500 MG PO TABS
500.0000 mg | ORAL_TABLET | Freq: Every day | ORAL | Status: DC
  Administered 2011-02-07 – 2011-02-10 (×4): 500 mg via ORAL
  Filled 2011-02-07 (×5): qty 1

## 2011-02-07 MED ORDER — ONDANSETRON HCL 4 MG/2ML IJ SOLN
4.0000 mg | Freq: Four times a day (QID) | INTRAMUSCULAR | Status: DC | PRN
  Administered 2011-02-07 – 2011-02-08 (×5): 4 mg via INTRAVENOUS
  Filled 2011-02-07 (×5): qty 2

## 2011-02-07 MED ORDER — SODIUM CHLORIDE 0.9 % IV SOLN
INTRAVENOUS | Status: AC
  Administered 2011-02-07 – 2011-02-08 (×2): via INTRAVENOUS

## 2011-02-07 MED ORDER — CITALOPRAM HYDROBROMIDE 40 MG PO TABS
40.0000 mg | ORAL_TABLET | Freq: Every day | ORAL | Status: DC
  Administered 2011-02-07 – 2011-02-10 (×4): 40 mg via ORAL
  Filled 2011-02-07 (×5): qty 1

## 2011-02-07 MED ORDER — LOSARTAN POTASSIUM 50 MG PO TABS
50.0000 mg | ORAL_TABLET | Freq: Every day | ORAL | Status: DC
  Administered 2011-02-07 – 2011-02-10 (×4): 50 mg via ORAL
  Filled 2011-02-07 (×5): qty 1

## 2011-02-07 MED ORDER — ALBUTEROL SULFATE (5 MG/ML) 0.5% IN NEBU: 2.5000 mg | INHALATION_SOLUTION | RESPIRATORY_TRACT | Status: DC | PRN

## 2011-02-07 MED ORDER — VITAMIN D3 25 MCG (1000 UNIT) PO TABS
1000.0000 [IU] | ORAL_TABLET | Freq: Every day | ORAL | Status: DC
  Administered 2011-02-07 – 2011-02-10 (×4): 1000 [IU] via ORAL
  Filled 2011-02-07 (×5): qty 1

## 2011-02-07 MED ORDER — DULOXETINE HCL 60 MG PO CPEP
60.0000 mg | ORAL_CAPSULE | Freq: Every day | ORAL | Status: DC
  Administered 2011-02-07 – 2011-02-10 (×4): 60 mg via ORAL
  Filled 2011-02-07 (×5): qty 1

## 2011-02-07 MED ORDER — INSULIN PEN STARTER KIT
1.0000 | Freq: Once | Status: AC
  Administered 2011-02-07: 1
  Filled 2011-02-07: qty 1

## 2011-02-07 MED ORDER — ALBUTEROL 90 MCG/ACT IN AERS: 2.0000 | INHALATION_SPRAY | RESPIRATORY_TRACT | Status: DC | PRN

## 2011-02-07 MED ORDER — POTASSIUM CHLORIDE CRYS ER 20 MEQ PO TBCR
40.0000 meq | EXTENDED_RELEASE_TABLET | Freq: Every day | ORAL | Status: DC
  Administered 2011-02-07 – 2011-02-10 (×3): 40 meq via ORAL
  Filled 2011-02-07 (×5): qty 2

## 2011-02-07 MED ORDER — ONDANSETRON HCL 4 MG PO TABS
4.0000 mg | ORAL_TABLET | Freq: Four times a day (QID) | ORAL | Status: DC | PRN
  Administered 2011-02-10: 4 mg via ORAL
  Filled 2011-02-07: qty 1

## 2011-02-07 MED ORDER — INSULIN GLARGINE 100 UNIT/ML ~~LOC~~ SOLN
5.0000 [IU] | Freq: Once | SUBCUTANEOUS | Status: AC
  Administered 2011-02-07: 5 [IU] via SUBCUTANEOUS

## 2011-02-07 MED ORDER — INSULIN GLARGINE 100 UNIT/ML ~~LOC~~ SOLN
25.0000 [IU] | Freq: Every day | SUBCUTANEOUS | Status: DC
  Administered 2011-02-07 – 2011-02-09 (×3): 25 [IU] via SUBCUTANEOUS
  Filled 2011-02-07: qty 3

## 2011-02-07 MED ORDER — MORPHINE SULFATE 2 MG/ML IJ SOLN
2.0000 mg | INTRAMUSCULAR | Status: DC | PRN
  Administered 2011-02-09 – 2011-02-10 (×3): 2 mg via INTRAVENOUS
  Filled 2011-02-07 (×5): qty 1

## 2011-02-07 NOTE — Progress Notes (Signed)
Subjective: Patient has poor appetite.  No other specific complaints.  Objective: Vital signs in last 24 hours: Filed Vitals:   02/06/11 2334 02/07/11 0004 02/07/11 0709 02/07/11 1501  BP: 157/65 138/66 177/78 150/85  Pulse: 98 95 93 89  Temp:  98 F (36.7 C) 98.3 F (36.8 C) 98.1 F (36.7 C)  TempSrc:  Oral Oral Oral  Resp: 18 19 19 19   Height:  5\' 3"  (1.6 m)    Weight:  99 kg (218 lb 4.1 oz)    SpO2: 95% 95% 93% 92%   Weight change:   Intake/Output Summary (Last 24 hours) at 02/07/11 1645 Last data filed at 02/07/11 1100  Gross per 24 hour  Intake 1433.53 ml  Output    550 ml  Net 883.53 ml    Physical Exam: General: Awake, Oriented, No acute distress. HEENT: EOMI. Neck: Supple CV: S1 and S2 Lungs: Clear to ascultation bilaterally Abdomen: Soft, Nontender, Nondistended, +bowel sounds. Ext: Good pulses.  Right arm in a sling.  Lab Results:  Basename 02/07/11 0435 02/07/11 0230  NA 135 133*  K 3.6 3.8  CL 99 99  CO2 24 22  GLUCOSE 330* 327*  BUN 5* 5*  CREATININE 0.51 0.53  CALCIUM 9.5 9.5  MG -- --  PHOS -- --   No results found for this basename: AST:2,ALT:2,ALKPHOS:2,BILITOT:2,PROT:2,ALBUMIN:2 in the last 72 hours No results found for this basename: LIPASE:2,AMYLASE:2 in the last 72 hours  Basename 02/07/11 0435 02/06/11 1900  WBC 14.2* 16.4*  NEUTROABS -- 12.7*  HGB 13.4 15.9*  HCT 40.4 47.2*  MCV 90.0 90.8  PLT 346 459*    Basename 02/07/11 0811 02/06/11 2258 02/06/11 0025  CKTOTAL 60 50 57  CKMB 2.5 2.2 2.1  CKMBINDEX -- -- --  TROPONINI <0.30 <0.30 <0.30   No components found with this basename: POCBNP:3 No results found for this basename: DDIMER:2 in the last 72 hours  Basename 02/06/11 1900  HGBA1C 11.9*   No results found for this basename: CHOL:2,HDL:2,LDLCALC:2,TRIG:2,CHOLHDL:2,LDLDIRECT:2 in the last 72 hours  Basename 02/07/11 0025  TSH 0.969  T4TOTAL --  T3FREE --  THYROIDAB --   No results found for this basename:  VITAMINB12:2,FOLATE:2,FERRITIN:2,TIBC:2,IRON:2,RETICCTPCT:2 in the last 72 hours  Micro Results: No results found for this or any previous visit (from the past 240 hour(s)).  Studies/Results: No results found.  Medications: I have reviewed the patient's current medications. Scheduled Meds:   . cholecalciferol  1,000 Units Oral Daily  . citalopram  40 mg Oral Daily  . clorazepate  3.75 mg Oral TID  . DULoxetine  60 mg Oral Daily  . Flexpen Starter Kit  1 kit Other Once  . insulin aspart  0-20 Units Subcutaneous TID WC  . insulin aspart  0-5 Units Subcutaneous QHS  . insulin aspart  6 Units Subcutaneous TID WC  . insulin glargine  25 Units Subcutaneous QHS  . insulin glargine  5 Units Subcutaneous Once  . losartan  50 mg Oral Daily  . ondansetron (ZOFRAN) IV  4 mg Intravenous Once  . potassium chloride SA  40 mEq Oral Daily  . sodium chloride  1,000 mL Intravenous Once  . sodium chloride  1,000 mL Intravenous Once  . vitamin C  500 mg Oral Daily  . DISCONTD: insulin aspart  4 Units Subcutaneous TID WC  . DISCONTD: potassium chloride  10 mEq Intravenous Q1H   Continuous Infusions:   . sodium chloride    . DISCONTD: sodium chloride    .  DISCONTD: 0.9 % NaCl with KCl 20 mEq / L 100 mL/hr at 02/07/11 1002  . DISCONTD: dextrose 5 % and 0.45% NaCl    . DISCONTD: insulin (NOVOLIN-R) infusion     PRN Meds:.albuterol, albuterol, morphine, ondansetron (ZOFRAN) IV, ondansetron, DISCONTD: dextrose  Assessment/Plan: 1. Type 2 diabetes mellitus with hyperosmolar nonketotic hyperglycemia.  Hemoglobin A1c is 11.9 suggesting a mean plasma glucose of 295.  Start the patient on Lantus at 25 units subcutaneous each bedtime.  Continue NovoLog 6 units 3 times a day with meals.  Further titration of Lantus and NovoLog depending on patient's clinical course.  Patient will likely need outpatient diabetic education, diabetic nurse coordinator consulted.  2.  Morbid obesity.  Diet and exercise as  outpatient.  3.  History of depression.  Stable continue home medications.  4.  Right shoulder fracture.  No imaging available for my review.  Per patient it occurred about 3 weeks ago and was told it was nonsurgical by orthopedics.  Currently arm in a sling.  5.  Dehydration secondary to uncontrolled diabetes.  Continue IV hydration.  Improved.  6.  Hypertension. Continue losartan. Furosemide held due to dehydration.  7.  Prophylaxis. Lovenox.  8.  Code status. Full code.   LOS: 1 day  Danne Vasek A, MD 02/07/2011, 4:45 PM

## 2011-02-08 DIAGNOSIS — N390 Urinary tract infection, site not specified: Secondary | ICD-10-CM | POA: Clinically undetermined

## 2011-02-08 LAB — CBC
HCT: 41.6 % (ref 36.0–46.0)
MCHC: 32.7 g/dL (ref 30.0–36.0)
Platelets: 302 10*3/uL (ref 150–400)
RDW: 12.8 % (ref 11.5–15.5)
WBC: 13.8 10*3/uL — ABNORMAL HIGH (ref 4.0–10.5)

## 2011-02-08 LAB — GLUCOSE, CAPILLARY
Glucose-Capillary: 244 mg/dL — ABNORMAL HIGH (ref 70–99)
Glucose-Capillary: 277 mg/dL — ABNORMAL HIGH (ref 70–99)

## 2011-02-08 LAB — URINE MICROSCOPIC-ADD ON

## 2011-02-08 LAB — URINE CULTURE
Colony Count: 10000
Special Requests: NORMAL

## 2011-02-08 LAB — URINALYSIS, ROUTINE W REFLEX MICROSCOPIC
Bilirubin Urine: NEGATIVE
Glucose, UA: >1000 mg/dL — AB
Protein, ur: 30 mg/dL — AB

## 2011-02-08 LAB — BASIC METABOLIC PANEL
BUN: 3 mg/dL — ABNORMAL LOW (ref 6–23)
Chloride: 96 mEq/L (ref 96–112)
GFR calc Af Amer: >90 mL/min (ref >90)
GFR calc non Af Amer: >90 mL/min (ref >90)
Potassium: 3.5 mEq/L (ref 3.5–5.1)
Sodium: 133 mEq/L — ABNORMAL LOW (ref 135–145)

## 2011-02-08 MED ORDER — DEXTROSE 5 % IV SOLN
1.0000 g | INTRAVENOUS | Status: DC
  Administered 2011-02-08 – 2011-02-09 (×2): 1 g via INTRAVENOUS
  Filled 2011-02-08 (×3): qty 10

## 2011-02-08 MED ORDER — AMLODIPINE BESYLATE 5 MG PO TABS
5.0000 mg | ORAL_TABLET | Freq: Every day | ORAL | Status: DC
  Administered 2011-02-08 – 2011-02-10 (×3): 5 mg via ORAL
  Filled 2011-02-08 (×5): qty 1

## 2011-02-08 NOTE — Progress Notes (Signed)
Subjective: Patient without any complaints except for frequency Objective: Filed Vitals:   02/07/11 0709 02/07/11 1501 02/07/11 2244 02/08/11 0517  BP: 177/78 150/85 167/93 167/87  Pulse: 93 89 107 92  Temp: 98.3 F (36.8 C) 98.1 F (36.7 C) 97.5 F (36.4 C) 98.4 F (36.9 C)  TempSrc: Oral Oral Oral Oral  Resp: 19 19 18 18   Height:      Weight:      SpO2: 93% 92% 96% 92%   Weight change:   Intake/Output Summary (Last 24 hours) at 02/08/11 2004 Last data filed at 02/08/11 1816  Gross per 24 hour  Intake 1530.75 ml  Output    600 ml  Net 930.75 ml    General: Alert, awake, oriented x3, in no acute distress.  HEENT: [place]/AT PEERL, EOMI Neck: Trachea midline,  no masses, no thyromegal,y no JVD, no carotid bruit OROPHARYNX:  Moist, No exudate/ erythema/lesions.  Heart: Regular rate and rhythm, without murmurs, rubs, gallops, PMI non-displaced, no heaves or thrills on palpation.  Lungs: Clear to auscultation, no wheezing or rhonchi noted. No increased vocal fremitus resonant to percussion  Abdomen: Soft, nontender, nondistended, positive bowel sounds, no masses no hepatosplenomegaly noted..  Neuro: No focal neurological deficits noted cranial nerves II through XII grossly intact. DTRs 2+ bilaterally upper and lower extremities. Strength 5 out of 5 in bilateral upper and lower extremities.     Lab Results:  Basename 02/08/11 0600 02/07/11 0435  NA 133* 135  K 3.5 3.6  CL 96 99  CO2 24 24  GLUCOSE 259* 330*  BUN 3* 5*  CREATININE 0.52 0.51  CALCIUM 9.3 9.5  MG -- --  PHOS -- --   No results found for this basename: AST:2,ALT:2,ALKPHOS:2,BILITOT:2,PROT:2,ALBUMIN:2 in the last 72 hours No results found for this basename: LIPASE:2,AMYLASE:2 in the last 72 hours  Basename 02/08/11 0600 02/07/11 0435 02/06/11 1900  WBC 13.8* 14.2* --  NEUTROABS -- -- 12.7*  HGB 13.6 13.4 --  HCT 41.6 40.4 --  MCV 90.6 90.0 --  PLT 302 346 --    Basename 02/07/11 0811 02/06/11 2258  02/06/11 0025  CKTOTAL 60 50 57  CKMB 2.5 2.2 2.1  CKMBINDEX -- -- --  TROPONINI <0.30 <0.30 <0.30   No components found with this basename: POCBNP:3 No results found for this basename: DDIMER:2 in the last 72 hours  Basename 02/06/11 1900  HGBA1C 11.9*   No results found for this basename: CHOL:2,HDL:2,LDLCALC:2,TRIG:2,CHOLHDL:2,LDLDIRECT:2 in the last 72 hours  Basename 02/07/11 0025  TSH 0.969  T4TOTAL --  T3FREE --  THYROIDAB --   No results found for this basename: VITAMINB12:2,FOLATE:2,FERRITIN:2,TIBC:2,IRON:2,RETICCTPCT:2 in the last 72 hours  Micro Results: No results found for this or any previous visit (from the past 240 hour(s)).  Studies/Results: No results found.  Medications: I have reviewed the patient's current medications. Scheduled Meds:   . cefTRIAXone (ROCEPHIN)  IV  1 g Intravenous Q24H  . cholecalciferol  1,000 Units Oral Daily  . citalopram  40 mg Oral Daily  . clorazepate  3.75 mg Oral TID  . DULoxetine  60 mg Oral Daily  . enoxaparin (LOVENOX) injection  40 mg Subcutaneous Q24H  . insulin aspart  0-20 Units Subcutaneous TID WC  . insulin aspart  0-5 Units Subcutaneous QHS  . insulin aspart  6 Units Subcutaneous TID WC  . insulin glargine  25 Units Subcutaneous QHS  . losartan  50 mg Oral Daily  . potassium chloride SA  40 mEq Oral Daily  .  vitamin C  500 mg Oral Daily   Continuous Infusions:   . sodium chloride 125 mL/hr at 02/08/11 0322   PRN Meds:.albuterol, albuterol, morphine, ondansetron (ZOFRAN) IV, ondansetron Assessment/Plan: Patient Active Hospital Problem List: Type 2 diabetes mellitus with hyperosmolar nonketotic hyperglycemia (02/06/2011)   Assessment: This is a new diagnosis for this patient. She will need diabetic education on diabetes as well as the administration of insulin. Hemoglobin A1c 11.9. Also check lipid profile  UTI (lower urinary tract infection) (02/08/2011)   Assessment: Started patient on Rocephin and she's  allergic to ciprofloxacin. Will await cultures and adjust antibiotics accordingly   EXOGENOUS OBESITY (07/21/2007)   Assessment: Dietary consult    HYPERTENSION (12/01/2006)   Assessment: Blood pressure still poorly controlled will need to adjust medications add norvasc     Fracture of right shoulder (02/07/2011)   Assessment: Previously on the care of orthopedic      LOS: 2 days

## 2011-02-09 LAB — GLUCOSE, CAPILLARY
Glucose-Capillary: 154 mg/dL — ABNORMAL HIGH (ref 70–99)
Glucose-Capillary: 181 mg/dL — ABNORMAL HIGH (ref 70–99)

## 2011-02-09 LAB — LIPID PANEL
Cholesterol: 169 mg/dL (ref 0–200)
HDL: 33 mg/dL — ABNORMAL LOW
LDL Cholesterol: 107 mg/dL — ABNORMAL HIGH (ref 0–99)
Total CHOL/HDL Ratio: 5.1 ratio
Triglycerides: 143 mg/dL
VLDL: 29 mg/dL (ref 0–40)

## 2011-02-09 LAB — BASIC METABOLIC PANEL
BUN: 4 mg/dL — ABNORMAL LOW (ref 6–23)
CO2: 25 mEq/L (ref 19–32)
Calcium: 9.7 mg/dL (ref 8.4–10.5)
Creatinine, Ser: 0.49 mg/dL — ABNORMAL LOW (ref 0.50–1.10)
GFR calc non Af Amer: >90 mL/min (ref >90)
Glucose, Bld: 219 mg/dL — ABNORMAL HIGH (ref 70–99)
Sodium: 134 mEq/L — ABNORMAL LOW (ref 135–145)

## 2011-02-09 MED ORDER — LIVING WELL WITH DIABETES BOOK
Freq: Once | Status: AC
  Administered 2011-02-09: 12:00:00
  Filled 2011-02-09: qty 1

## 2011-02-09 MED ORDER — BD GETTING STARTED TAKE HOME KIT: 3/10ML X 30G SYRINGES: 1.0000 | Freq: Once | Status: DC

## 2011-02-09 MED ORDER — METFORMIN HCL 500 MG PO TABS
500.0000 mg | ORAL_TABLET | Freq: Two times a day (BID) | ORAL | Status: DC
  Administered 2011-02-09 – 2011-02-10 (×2): 500 mg via ORAL
  Filled 2011-02-09 (×5): qty 1

## 2011-02-09 NOTE — Progress Notes (Signed)
Inpatient Diabetes Program Recommendations  AACE/ADA: New Consensus Statement on Inpatient Glycemic Control (2009)  Target Ranges:  Prepandial:   less than 140 mg/dL      Peak postprandial:   less than 180 mg/dL (1-2 hours)      Critically ill patients:  140 - 180 mg/dL   Reason for Visit:   Results for Shannon Lynch, Shannon Lynch (MRN [id number]) as of 02/09/2011 16:13  Ref. Range 02/08/2011 17:03 02/08/2011 22:21 02/09/2011 05:45 02/09/2011 07:39 02/09/2011 13:31  Glucose-Capillary Latest Range: 70-99 mg/dL 782 (H) 956 (H)  213 (H) 212 (H)  Glucose Latest Range: 70-99 mg/dL   086 (H)      Inpatient Diabetes Program Recommendations Insulin - Basal: Increase Lantus to 30 units QHS Insulin - Meal Coverage: Increase meal coverage insulin to Novolog 8 units tidwc HgbA1C: 11.9% Outpatient Referral: Received OP Diabetes order  Note: Discussed diabetes diagnosis and gave Living Well Booklet to pt.  Encouraged her to watch diabetes videos on pt ed channel.  Educated pt on insulin administration, diet, exercise and importance of glucose control to prevent long term complications.  Pt motivated to make changes and voices understanding.  RN to begin insulin pen education today.  Willing to attend [employer] classes.  Will followup tomorrow morning.  Ailene Ards, RD, LDN, CDE

## 2011-02-09 NOTE — Progress Notes (Signed)
Pharmacy Note: On Celexa 40mg  daily as taken prior to admission.  New FDA Alert recommends limiting Celexa dosage to 20mg  daily when age > 60 due to increased risk of QTc prolongation and life-threatening arrhythmias.    Recommend: Consider risk vs. benefit of continuing present Celexa dosage.  Elie Goody, Pharm.D.  [phone] 02/09/2011 8:33 PM

## 2011-02-09 NOTE — Progress Notes (Signed)
Subjective: Patient without any complaints except for frequency Objective: Filed Vitals:   02/08/11 0517 02/08/11 2313 02/09/11 0634 02/09/11 1545  BP: 167/87 157/83 158/88 130/79  Pulse: 92 97 100 104  Temp: 98.4 F (36.9 C) 98.4 F (36.9 C) 97.7 F (36.5 C) 98.1 F (36.7 C)  TempSrc: Oral Oral Oral Oral  Resp: 18 18 18 16   Height:      Weight:      SpO2: 92% 97% 97% 91%   Weight change:   Intake/Output Summary (Last 24 hours) at 02/09/11 1840 Last data filed at 02/09/11 1547  Gross per 24 hour  Intake    240 ml  Output    600 ml  Net   -360 ml    General: Alert, awake, oriented x3, in no acute distress.  HEENT: [place]/AT PEERL, EOMI Neck: Trachea midline,  no masses, no thyromegal,y no JVD, no carotid bruit OROPHARYNX:  Moist, No exudate/ erythema/lesions.  Heart: Regular rate and rhythm, without murmurs, rubs, gallops, PMI non-displaced, no heaves or thrills on palpation.  Lungs: Clear to auscultation, no wheezing or rhonchi noted. No increased vocal fremitus resonant to percussion  Abdomen: Soft, nontender, nondistended, positive bowel sounds, no masses no hepatosplenomegaly noted..  Neuro: No focal neurological deficits noted cranial nerves II through XII grossly intact. DTRs 2+ bilaterally upper and lower extremities. Strength 5 out of 5 in bilateral upper and lower extremities.     Lab Results:  Basename 02/09/11 0545 02/08/11 0600  NA 134* 133*  K 3.0* 3.5  CL 94* 96  CO2 25 24  GLUCOSE 219* 259*  BUN 4* 3*  CREATININE 0.49* 0.52  CALCIUM 9.7 9.3  MG -- --  PHOS -- --   No results found for this basename: AST:2,ALT:2,ALKPHOS:2,BILITOT:2,PROT:2,ALBUMIN:2 in the last 72 hours No results found for this basename: LIPASE:2,AMYLASE:2 in the last 72 hours  Basename 02/08/11 0600 02/07/11 0435 02/06/11 1900  WBC 13.8* 14.2* --  NEUTROABS -- -- 12.7*  HGB 13.6 13.4 --  HCT 41.6 40.4 --  MCV 90.6 90.0 --  PLT 302 346 --    Basename 02/07/11 0811 02/06/11  2258  CKTOTAL 60 50  CKMB 2.5 2.2  CKMBINDEX -- --  TROPONINI <0.30 <0.30   No components found with this basename: POCBNP:3 No results found for this basename: DDIMER:2 in the last 72 hours  Basename 02/06/11 1900  HGBA1C 11.9*    Basename 02/09/11 0545  CHOL 169  HDL 33*  LDLCALC 107*  TRIG 143  CHOLHDL 5.1  LDLDIRECT --    Basename 02/07/11 0025  TSH 0.969  T4TOTAL --  T3FREE --  THYROIDAB --   No results found for this basename: VITAMINB12:2,FOLATE:2,FERRITIN:2,TIBC:2,IRON:2,RETICCTPCT:2 in the last 72 hours  Micro Results: Recent Results (from the past 240 hour(s))  URINE CULTURE     Status: Normal   Collection Time   02/08/11 11:21 AM      Component Value Range Status Comment   Specimen Description URINE, RANDOM   Final    Special Requests none Normal   Final    Setup Time [id number]   Final    Colony Count 10,000 COLONIES/ML   Final    Culture     Final    Value: Multiple bacterial morphotypes present, none predominant. Suggest appropriate recollection if clinically indicated.   Report Status 02/09/2011 FINAL   Final     Studies/Results: No results found.  Medications: I have reviewed the patient's current medications. Scheduled Meds:    .  amLODipine  5 mg Oral Daily  . cefTRIAXone (ROCEPHIN)  IV  1 g Intravenous Q24H  . cholecalciferol  1,000 Units Oral Daily  . citalopram  40 mg Oral Daily  . clorazepate  3.75 mg Oral TID  . DULoxetine  60 mg Oral Daily  . enoxaparin (LOVENOX) injection  40 mg Subcutaneous Q24H  . insulin aspart  0-20 Units Subcutaneous TID WC  . insulin aspart  0-5 Units Subcutaneous QHS  . insulin aspart  6 Units Subcutaneous TID WC  . insulin glargine  25 Units Subcutaneous QHS  . living well with diabetes book   Does not apply Once  . losartan  50 mg Oral Daily  . metFORMIN  500 mg Oral BID WC  . potassium chloride SA  40 mEq Oral Daily  . vitamin C  500 mg Oral Daily  . DISCONTD: bd getting started take home kit  1  kit Other Once   Continuous Infusions:  PRN Meds:.albuterol, albuterol, morphine, ondansetron (ZOFRAN) IV, ondansetron Assessment/Plan: Patient Active Hospital Problem List: Type 2 diabetes mellitus with hyperosmolar nonketotic hyperglycemia (02/06/2011)   Assessment: This is a new diagnosis for this patient. She will need diabetic education on diabetes as well as the administration of insulin. Hemoglobin A1c 11.9. Added metformin to her regimen.   UTI (lower urinary tract infection) (02/08/2011)   Assessment: Urine culture negative. Although negative will treat empirically with antibiotics for 3 days and then DC .   EXOGENOUS OBESITY (07/21/2007)   Assessment: Dietary consult    HYPERTENSION (12/01/2006)   Assessment: Blood pressure still poorly controlled will need to adjust medications add norvasc     Fracture of right shoulder (02/07/2011)   Assessment: Previously on the care of orthopedic      LOS: 3 days

## 2011-02-10 ENCOUNTER — Encounter: Payer: Self-pay | Admitting: Internal Medicine

## 2011-02-10 LAB — GLUCOSE, CAPILLARY: Glucose-Capillary: 172 mg/dL — ABNORMAL HIGH (ref 70–99)

## 2011-02-10 MED ORDER — CITALOPRAM HYDROBROMIDE 40 MG PO TABS: 20.0000 mg | ORAL_TABLET | Freq: Every day | ORAL | Status: DC

## 2011-02-10 MED ORDER — AMLODIPINE BESYLATE 5 MG PO TABS: 5.0000 mg | ORAL_TABLET | Freq: Every day | ORAL | Status: DC

## 2011-02-10 MED ORDER — METFORMIN HCL 500 MG PO TABS: 500.0000 mg | ORAL_TABLET | Freq: Two times a day (BID) | ORAL | Status: DC

## 2011-02-10 MED ORDER — INSULIN GLARGINE 100 UNIT/ML ~~LOC~~ SOLN: 25.0000 [IU] | Freq: Every day | SUBCUTANEOUS | Status: DC

## 2011-02-10 MED ORDER — GLIMEPIRIDE 2 MG PO TABS: 2.0000 mg | ORAL_TABLET | Freq: Every day | ORAL | Status: DC

## 2011-02-10 MED ORDER — GLUCOSE BLOOD VI STRP: ORAL_STRIP | Status: AC

## 2011-02-10 NOTE — Progress Notes (Signed)
Shannon Lynch MRN: [id number] DOB/AGE: 67-14-1947 67 y.o.  Admit date: 02/06/2011 Discharge date: 02/10/2011  Primary Care Physician:  Shannon Lynch, Shannon Dach, MD   Discharge Diagnoses:   Patient Active Problem List  Diagnoses  . EXOGENOUS OBESITY  . ANXIETY DEPRESSION  . HYPERTENSION  . COPD, MILD  . LOW BACK PAIN, ACUTE  . RENAL COLIC  . Bronchitis  . Type 2 diabetes mellitus with hyperosmolar nonketotic hyperglycemia  . Fracture of right shoulder    DISCHARGE MEDICATION: Current Discharge Medication List    START taking these medications   Details  amLODipine (NORVASC) 5 MG tablet Take 1 tablet (5 mg total) by mouth daily. Qty: 30 tablet, Refills: 0    glimepiride (AMARYL) 2 MG tablet Take 1 tablet (2 mg total) by mouth daily before breakfast. Qty: 30 tablet, Refills: 0    insulin glargine (LANTUS) 100 UNIT/ML injection Inject 25 Units into the skin at bedtime. Qty: 10 mL, Refills: 0    metFORMIN (GLUCOPHAGE) 500 MG tablet Take 1 tablet (500 mg total) by mouth 2 (two) times daily with a meal. Qty: 60 tablet, Refills: 0      CONTINUE these medications which have CHANGED   Details  citalopram (CELEXA) 40 MG tablet Take 0.5 tablets (20 mg total) by mouth daily. Qty: 30 tablet, Refills: 5      CONTINUE these medications which have NOT CHANGED   Details  acetaminophen (TYLENOL) 500 MG tablet Take 1,000 mg by mouth every 6 (six) hours as needed. pain    albuterol (PROVENTIL,VENTOLIN) 90 MCG/ACT inhaler Inhale 2 puffs into the lungs every 6 (six) hours as needed for wheezing. Qty: 17 g, Refills: 0    cholecalciferol (VITAMIN D) 1000 UNITS tablet Take 1,000 Units by mouth daily.    clorazepate (TRANXENE) 3.75 MG tablet Take 1 tablet (3.75 mg total) by mouth 3 (three) times daily. Qty: 90 tablet, Refills: 5    DULoxetine (CYMBALTA) 60 MG capsule Take 1 capsule (60 mg total) by mouth daily. Qty: 30 capsule, Refills: 11    furosemide (LASIX) 20 MG tablet  Take 20 mg by mouth 2 (two) times daily.      HYDROmorphone (DILAUDID) 2 MG tablet Take 2 mg by mouth every 4 (four) hours as needed. pain    KLOR-CON M20 20 MEQ tablet TAKE 1 TABLET BY MOUTH EVERY DAY Qty: 30 tablet, Refills: 5    losartan (COZAAR) 50 MG tablet Take 50 mg by mouth daily.      Naproxen-Esomeprazole (VIMOVO PO) Take by mouth daily.      vitamin C (ASCORBIC ACID) 500 MG tablet Take 500 mg by mouth daily.                  Recent Results (from the past 240 hour(s))  URINE CULTURE     Status: Normal   Collection Time   02/08/11 11:21 AM      Component Value Range Status Comment   Specimen Description URINE, RANDOM   Final    Special Requests none Normal   Final    Setup Time [id number]   Final    Colony Count 10,000 COLONIES/ML   Final    Culture     Final    Value: Multiple bacterial morphotypes present, none predominant. Suggest appropriate recollection if clinically indicated.   Report Status 02/09/2011 FINAL   Final     BRIEF ADMITTING H & P: Shannon Lynch is an 66 y.o. female with history of obesity,  hypertension, anxiety and depression, presents to her primary care physician and was found to have elevated blood glucose. It should be noted that she was not diagnosed with diabetes prior to today. She was sent to the emergency room and was confirmed that her blood glucose was 600. She has been experiencing symptoms of hypoglycemia including intermittent blurry vision, polydipsia, polyuria, and polyphagia. She denied any chest pain or shortness of breath. She has had no abdominal cramps or pain. She was given diuretic as an augmentation (reportedly by her) to her blood pressure medication. She was found to have hyperosmolar nonketotic hyperglycemia with a bicarbonate of 25, and a potassium of 3.5, blood sugar is 670 and concomitant serum sodium of 128. She was initially started on DKA glucose stabilizer, but with 1 L of fluid, her blood glucose dropped  down to the 300s. Also, she was recently fell and had a nonsurgical right shoulder fracture, evaluated by orthopedics, and it isnow in a sling. Hospitalist was asked to admit her for evaluation of hyperglycemia.   Hospital Course:  Present on Admission:  .Type 2 diabetes mellitus with hyperosmolar nonketotic hyperglycemia: Pt had no previous diagnosis of DM 2. She was found to have an elevated CBGn by her PMD and confirmed to be 600 in the ED. She was not in DKA or an altered mental state as a result. She was started on Lantus and Novo log. Subsequently, Metformin and Amaryl were added. Pt received diabetic education and instruction in administering insulin. She was also given a prescription for glucometer and instructions in checking CBG's. She is to follow up wit her PMD in 3 days.  Marland KitchenEXOGENOUS OBESITY: Nutritional counsel given  .HYPERTENSION: Well controlled on Amlodipine and Losartan.  Marland KitchenANXIETY DEPRESSION: continue previous medications. Mood stable  .Fracture of right shoulder: Under the care of Orthopedic Surgery  Disposition and Follow-up: F/U with PMD Dr. Berline Chough in 3 days Discharge Orders    Future Orders Please Complete By Expires   Diet Carb Modified      Comments:   Heart Healthy Diet   Activity as tolerated - No restrictions         DISCHARGE EXAM:  General: Alert, awake, oriented x3, in no acute distress.  Blood pressure 135/75, pulse 99, temperature 97.6 F (36.4 C), temperature source Oral, resp. rate 20, height 5\' 3"  (1.6 m), weight 97.5 kg (214 lb 15.2 oz), SpO2 93.00%. HEENT: [place]/AT PEERL, EOMI  Neck: Trachea midline, no masses, no thyromegal,y no JVD, no carotid bruit  OROPHARYNX: Moist, No exudate/ erythema/lesions.  Heart: Regular rate and rhythm, without murmurs, rubs, gallops, PMI non-displaced, no heaves or thrills on palpation.  Lungs: Clear to auscultation, no wheezing or rhonchi noted. No increased vocal fremitus resonant to percussion  Abdomen: Soft,  nontender, nondistended, positive bowel sounds, no masses no hepatosplenomegaly noted..  Neuro: No focal neurological deficits noted cranial nerves II through XII grossly intact. DTRs 2+ bilaterally upper and lower extremities. Strength 5 out of 5 in bilateral upper and lower extremities.    Basename 02/09/11 0545 02/08/11 0600  NA 134* 133*  K 3.0* 3.5  CL 94* 96  CO2 25 24  GLUCOSE 219* 259*  BUN 4* 3*  CREATININE 0.49* 0.52  CALCIUM 9.7 9.3  MG -- --  PHOS -- --   No results found for this basename: AST:2,ALT:2,ALKPHOS:2,BILITOT:2,PROT:2,ALBUMIN:2 in the last 72 hours No results found for this basename: LIPASE:2,AMYLASE:2 in the last 72 hours  Basename 02/08/11 0600  WBC 13.8*  NEUTROABS --  HGB 13.6  HCT 41.6  MCV 90.6  PLT 302   Total time for D/C process including face to face time 35 minutes. Signed: Reiley Bertagnolli A. 02/10/2011, 5:05 PM

## 2011-02-10 NOTE — Progress Notes (Signed)
Results for MIKAIAH, STOFFER (MRN [id number]) as of 02/10/2011 13:52  Ref. Range 02/09/2011 07:39 02/09/2011 13:31 02/09/2011 17:55 02/09/2011 21:15 02/10/2011 09:13  Glucose-Capillary Latest Range: 70-99 mg/dL 295 (H) 621 (H) 308 (H) 181 (H) 173 (H)    Blood sugars improving.  Pt and husband viewed diabetes videos and RN started teaching insulin admin with pen.  Encouraged pt to f/u with PCP regarding new-onset diabetes.  Answered questions and encouraged pt to make healthy choices with diet, including not skipping meals.  Discussed blood glucose monitoring and importance of keeping log to take to MD appt.  Pt verbalized understanding.    Ailene Ards, RD, LDN, CDE In-patient Diabetes Coordinator Pager:  [phone]

## 2011-03-09 ENCOUNTER — Other Ambulatory Visit: Payer: Self-pay | Admitting: Family Medicine

## 2011-03-09 NOTE — Telephone Encounter (Signed)
Rx refill sent to pharmacy. 

## 2011-04-08 ENCOUNTER — Other Ambulatory Visit: Payer: Self-pay | Admitting: Family Medicine

## 2011-04-08 NOTE — Telephone Encounter (Signed)
Rx denied office visit needed. 

## 2011-05-06 ENCOUNTER — Telehealth: Payer: Self-pay | Admitting: Internal Medicine

## 2011-05-06 NOTE — Telephone Encounter (Signed)
Rx refill denial provided to Shannon Lynch at [facility]. Shannon Lynch was advised to have patient contact office for appointment.

## 2011-05-06 NOTE — Telephone Encounter (Signed)
Patient does not have follow up on file. Is it okay to provide refills for this medication.

## 2011-05-06 NOTE — Telephone Encounter (Signed)
Saw once in November for bronchitis. Pt canceled f/u appt and has no further appt. Deny.

## 2011-05-06 NOTE — Telephone Encounter (Signed)
Clorazepate 3.75mg  tablet. Take one tablet 3 times a day.

## 2011-10-22 ENCOUNTER — Other Ambulatory Visit: Payer: Self-pay | Admitting: Internal Medicine

## 2011-11-04 ENCOUNTER — Ambulatory Visit
Admission: RE | Admit: 2011-11-04 | Discharge: 2011-11-04 | Disposition: A | Discharge status: Home / Self Care | Payer: BC Managed Care – PPO | Source: Ambulatory Visit | Attending: Internal Medicine | Admitting: Internal Medicine

## 2012-04-26 ENCOUNTER — Other Ambulatory Visit: Payer: Self-pay | Admitting: Family Medicine

## 2012-06-08 ENCOUNTER — Encounter: Payer: Self-pay | Admitting: Obstetrics and Gynecology

## 2012-06-22 ENCOUNTER — Encounter: Payer: Self-pay | Admitting: Obstetrics and Gynecology

## 2012-06-22 ENCOUNTER — Ambulatory Visit (INDEPENDENT_AMBULATORY_CARE_PROVIDER_SITE_OTHER): Payer: BC Managed Care – PPO | Admitting: Obstetrics and Gynecology

## 2012-06-22 VITALS — BP 124/80 | Ht 60.0 in | Wt 225.0 lb

## 2012-06-22 DIAGNOSIS — Z01419 Encounter for gynecological examination (general) (routine) without abnormal findings: Secondary | ICD-10-CM

## 2012-06-22 NOTE — Patient Instructions (Signed)

## 2012-06-22 NOTE — Progress Notes (Signed)
67 y.o.   Married    Caucasian   female   G0P0   here for annual exam.  Sees Shary Decamp for primary care.  Also sees [facility] regarding knees.  Has had recent onset of hot flashes and feels like she has a "hormone imbalance"  Never took hormone replacement.  Pt sweats excessively.  It has been ongoing for the past year. Broke her shoulder at Christmas and had it immobilized to heal as an alternative to shoulder replacement.  Also was recently hospitalized with a "lung infection" and was treated as an outpatient.  Pt was given steroids for her lung infection, and then her sugar went up to 700 and she had to be hospitalized.    Patient's last menstrual period was 06/23/1995.          Sexually active: no  The current method of family planning is post menopausal status.    Exercising: walking, stationary cycling Last mammogram:  2011 normal  It's just being "stupid" and also is very busy Last pap smear: 15 years ago History of abnormal pap: no Smoking:quit 2 years ago Alcohol: no Last colonoscopy: 5 years ago, repeat in 5years  Last Bone Density:  6months ago normal Done at Dr. Trenton Gammon office Last tetanus shot:not sure Last cholesterol check: 04/2012 normal  Hgb:   pcp             Urine:pcp   Family History  Problem Relation Age of Onset  . Emphysema Father     Patient Active Problem List   Diagnosis Date Noted  . Fracture of right shoulder 02/07/2011  . Type 2 diabetes mellitus with hyperosmolar nonketotic hyperglycemia 02/06/2011  . Bronchitis 12/17/2010  . LOW BACK PAIN, ACUTE 12/30/2009  . RENAL COLIC 12/30/2009  . EXOGENOUS OBESITY 07/21/2007  . ANXIETY DEPRESSION 07/21/2007  . COPD, MILD 07/21/2007  . HYPERTENSION 12/01/2006    Past Medical History  Diagnosis Date  . Hypertension   . Exogenous obesity   . Arthritis   . Anxiety   . Depression   . Diabetes mellitus without complication   . Chronic UTI (urinary tract infection)   . Osteonecrosis     Past  Surgical History  Procedure Laterality Date  . Total knee arthroplasty    . Tubal ligation      Allergies: Ciprofloxacin; Codeine; and Prednisone  Current Outpatient Prescriptions  Medication Sig Dispense Refill  . acetaminophen (TYLENOL) 500 MG tablet Take 1,000 mg by mouth every 6 (six) hours as needed. pain      . cholecalciferol (VITAMIN D) 1000 UNITS tablet Take 1,000 Units by mouth daily.      . clorazepate (TRANXENE) 3.75 MG tablet Take 1 tablet (3.75 mg total) by mouth 3 (three) times daily.  90 tablet  5  . CRANBERRY PO Take 4,200 mg by mouth as needed.      Marland Kitchen KLOR-CON M20 20 MEQ tablet TAKE 1 TABLET BY MOUTH EVERY DAY  30 tablet  5  . losartan (COZAAR) 50 MG tablet TAKE 1 TABLET BY MOUTH EVERY DAY FOR BLOOD PRESSURE  30 tablet  5  . metFORMIN (GLUCOPHAGE) 1000 MG tablet Take 1,000 mg by mouth daily.      . Nutritional Supplements (JUICE PLUS FIBRE) LIQD Take by mouth daily.      . vitamin C (ASCORBIC ACID) 500 MG tablet Take 500 mg by mouth daily.      Marland Kitchen albuterol (PROVENTIL,VENTOLIN) 90 MCG/ACT inhaler Inhale 2 puffs into the lungs every  6 (six) hours as needed for wheezing.  17 g  0  . amLODipine (NORVASC) 5 MG tablet Take 1 tablet (5 mg total) by mouth daily.  30 tablet  0  . citalopram (CELEXA) 40 MG tablet Take 0.5 tablets (20 mg total) by mouth daily.  30 tablet  5  . DULoxetine (CYMBALTA) 60 MG capsule Take 1 capsule (60 mg total) by mouth daily.  30 capsule  11  . metFORMIN (GLUCOPHAGE) 500 MG tablet Take 1 tablet (500 mg total) by mouth 2 (two) times daily with a meal.  60 tablet  0   No current facility-administered medications for this visit.    ROS: Pertinent items are noted in HPI.  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     Exam:    BP 124/80  Ht 5' (1.524 m)  Wt 225 lb (102.059 kg)  BMI 43.94 kg/m2  LMP 06/23/1995   Wt Readings from Last 3 Encounters:  06/22/12 225 lb (102.059 kg)  02/10/11 214 lb 15.2 oz  (97.5 kg)  12/15/10 234 lb (106.142 kg)     Ht Readings from Last 3 Encounters:  06/22/12 5' (1.524 m)  02/07/11 5\' 3"  (1.6 m)  08/08/09 5\' 1"  (1.549 m)    General appearance: alert, cooperative and appears stated age Head: Normocephalic, without obvious abnormality, atraumatic Neck: no adenopathy, supple, symmetrical, trachea midline and thyroid not enlarged, symmetric, no tenderness/mass/nodules Lungs: clear to auscultation bilaterally Breasts: Inspection negative, No nipple retraction or dimpling, No nipple discharge or bleeding, No axillary or supraclavicular adenopathy, Normal to palpation without dominant masses Heart: regular rate and rhythm Abdomen: soft, non-tender; bowel sounds normal; no masses,  no organomegaly Extremities: extremities normal, atraumatic, no cyanosis or edema Skin: Skin color, texture, turgor normal. No rashes or lesions Lymph nodes: Cervical, supraclavicular, and axillary nodes normal. No abnormal inguinal nodes palpated Neurologic: Grossly normal   Pelvic: External genitalia:  no lesions              Urethra:  normal appearing urethra with no masses, tenderness or lesions              Bartholins and Skenes: normal                 Vagina: normal appearing vagina with normal color and discharge, no lesions              Cervix: normal appearance              Pap taken: yes        Bimanual Exam:  Uterus:  uterus is normal size, shape, consistency and nontender                                      Adnexa: normal adnexa in size, nontender and no masses                                      Rectovaginal: Confirms                                      Anus:  normal sphincter tone, no lesions  A: normal menopausal exam, no HRT     AODM  Anxiety/depression     H/o osteonecrosis of both knees;  right one is replaced, the left one may need to be     H/o COPD     Obesity, HTN     Hot flashes     P: mammogram counseled on breast self exam, mammography  screening, adequate intake of calcium and vitamin D, diet and exercise return annually or prn   I do not believe her hot flashes are related to her estrogen level, as she has been menopausal for years and these severe sweats have just started over the past year.  They may be related to her obesity and inactivity.  She has had her thyroid checked and tells me it was ok.     An After Visit Summary was printed and given to the patient.

## 2012-06-24 LAB — IPS PAP TEST WITH HPV

## 2013-09-14 ENCOUNTER — Encounter (HOSPITAL_COMMUNITY): Payer: Self-pay | Admitting: Pharmacy Technician

## 2013-09-18 NOTE — Patient Instructions (Addendum)
Shannon Lynch  09/18/2013   Your procedure is scheduled on:  09/26/2013    Report to [facility].  Follow the Signs to [place] at   1120     am  Call this number if you have problems the morning of surgery: [phone]   Remember:   Do not eat food after midnite.  May have clear liquids until 0800am then npo.    Take these medicines the morning of surgery with A SIP OF WATER:    Do not wear jewelry, make-up or nail polish.  Do not wear lotions, powders, or perfumes. , deodorant    Do not shave 48 hours prior to surgery.   Do not bring valuables to the hospital.  Contacts, dentures or bridgework may not be worn into surgery.  Leave suitcase in the car. After surgery it may be brought to your room.  For patients admitted to the hospital, checkout time is 11:00 AM the day of  discharge.       CLEAR LIQUID DIET   Foods Allowed                                                                     Foods Excluded  Coffee and tea, regular and decaf                             liquids that you cannot  Plain Jell-O in any flavor                                             see through such as: Fruit ices (not with fruit pulp)                                     milk, soups, orange juice  Iced Popsicles                                    All solid food Carbonated beverages, regular and diet                                    Cranberry, grape and apple juices Sports drinks like Gatorade Lightly seasoned clear broth or consume(fat free) Sugar, honey syrup  Sample Menu Breakfast                                Lunch                                     Supper Cranberry juice                    Beef broth  Chicken broth Jell-O                                     Grape juice                           Apple juice Coffee or tea                        Jell-O                                      Popsicle                                                 Coffee or tea                        Coffee or tea  _____________________________________________________________________  [facility] - Preparing for Surgery Before surgery, you can play an important role.  Because skin is not sterile, your skin needs to be as free of germs as possible.  You can reduce the number of germs on your skin by washing with CHG (chlorahexidine gluconate) soap before surgery.  CHG is an antiseptic cleaner which kills germs and bonds with the skin to continue killing germs even after washing. Please DO NOT use if you have an allergy to CHG or antibacterial soaps.  If your skin becomes reddened/irritated stop using the CHG and inform your nurse when you arrive at Short Stay. Do not shave (including legs and underarms) for at least 48 hours prior to the first CHG shower.  You may shave your face/neck. Please follow these instructions carefully:  1.  Shower with CHG Soap the night before surgery and the  morning of Surgery.  2.  If you choose to wash your hair, wash your hair first as usual with your  normal  shampoo.  3.  After you shampoo, rinse your hair and body thoroughly to remove the  shampoo.                           4.  Use CHG as you would any other liquid soap.  You can apply chg directly  to the skin and wash                       Gently with a scrungie or clean washcloth.  5.  Apply the CHG Soap to your body ONLY FROM THE NECK DOWN.   Do not use on face/ open                           Wound or open sores. Avoid contact with eyes, ears mouth and genitals (private parts).                       Wash face,  Genitals (private parts) with your normal soap.             6.  Wash thoroughly, paying special attention to the area  where your surgery  will be performed.  7.  Thoroughly rinse your body with warm water from the neck down.  8.  DO NOT shower/wash with your normal soap after using and rinsing off  the CHG Soap.                9.  Pat yourself dry with a  clean towel.            10.  Wear clean pajamas.            11.  Place clean sheets on your bed the night of your first shower and do not  sleep with pets. Day of Surgery : Do not apply any lotions/deodorants the morning of surgery.  Please wear clean clothes to the hospital/surgery center.  FAILURE TO FOLLOW THESE INSTRUCTIONS MAY RESULT IN THE CANCELLATION OF YOUR SURGERY PATIENT SIGNATURE_________________________________  NURSE SIGNATURE__________________________________  ________________________________________________________________________  WHAT IS A BLOOD TRANSFUSION? Blood Transfusion Information  A transfusion is the replacement of blood or some of its parts. Blood is made up of multiple cells which provide different functions.  Red blood cells carry oxygen and are used for blood loss replacement.  White blood cells fight against infection.  Platelets control bleeding.  Plasma helps clot blood.  Other blood products are available for specialized needs, such as hemophilia or other clotting disorders. BEFORE THE TRANSFUSION  Who gives blood for transfusions?   Healthy volunteers who are fully evaluated to make sure their blood is safe. This is blood bank blood. Transfusion therapy is the safest it has ever been in the practice of medicine. Before blood is taken from a donor, a complete history is taken to make sure that person has no history of diseases nor engages in risky social behavior (examples are intravenous drug use or sexual activity with multiple partners). The donor's travel history is screened to minimize risk of transmitting infections, such as malaria. The donated blood is tested for signs of infectious diseases, such as HIV and hepatitis. The blood is then tested to be sure it is compatible with you in order to minimize the chance of a transfusion reaction. If you or a relative donates blood, this is often done in anticipation of surgery and is not appropriate for  emergency situations. It takes many days to process the donated blood. RISKS AND COMPLICATIONS Although transfusion therapy is very safe and saves many lives, the main dangers of transfusion include:   Getting an infectious disease.  Developing a transfusion reaction. This is an allergic reaction to something in the blood you were given. Every precaution is taken to prevent this. The decision to have a blood transfusion has been considered carefully by your caregiver before blood is given. Blood is not given unless the benefits outweigh the risks. AFTER THE TRANSFUSION  Right after receiving a blood transfusion, you will usually feel much better and more energetic. This is especially true if your red blood cells have gotten low (anemic). The transfusion raises the level of the red blood cells which carry oxygen, and this usually causes an energy increase.  The nurse administering the transfusion will monitor you carefully for complications. HOME CARE INSTRUCTIONS  No special instructions are needed after a transfusion. You may find your energy is better. Speak with your caregiver about any limitations on activity for underlying diseases you may have. SEEK MEDICAL CARE IF:   Your condition is not improving after your transfusion.  You develop redness or irritation at the intravenous (  IV) site. SEEK IMMEDIATE MEDICAL CARE IF:  Any of the following symptoms occur over the next 12 hours:  Shaking chills.  You have a temperature by mouth above 102 F (38.9 C), not controlled by medicine.  Chest, back, or muscle pain.  People around you feel you are not acting correctly or are confused.  Shortness of breath or difficulty breathing.  Dizziness and fainting.  You get a rash or develop hives.  You have a decrease in urine output.  Your urine turns a dark color or changes to pink, red, or brown. Any of the following symptoms occur over the next 10 days:  You have a temperature by  mouth above 102 F (38.9 C), not controlled by medicine.  Shortness of breath.  Weakness after normal activity.  The white part of the eye turns yellow (jaundice).  You have a decrease in the amount of urine or are urinating less often.  Your urine turns a dark color or changes to pink, red, or brown. Document Released: 01/10/2000 Document Revised: 04/06/2011 Document Reviewed: 08/29/2007 ExitCare Patient Information 2014 [place].  _______________________________________________________________________  Incentive Spirometer  An incentive spirometer is a tool that can help keep your lungs clear and active. This tool measures how well you are filling your lungs with each breath. Taking long deep breaths may help reverse or decrease the chance of developing breathing (pulmonary) problems (especially infection) following:  A long period of time when you are unable to move or be active. BEFORE THE PROCEDURE   If the spirometer includes an indicator to show your best effort, your nurse or respiratory therapist will set it to a desired goal.  If possible, sit up straight or lean slightly forward. Try not to slouch.  Hold the incentive spirometer in an upright position. INSTRUCTIONS FOR USE  1. Sit on the edge of your bed if possible, or sit up as far as you can in bed or on a chair. 2. Hold the incentive spirometer in an upright position. 3. Breathe out normally. 4. Place the mouthpiece in your mouth and seal your lips tightly around it. 5. Breathe in slowly and as deeply as possible, raising the piston or the ball toward the top of the column. 6. Hold your breath for 3-5 seconds or for as long as possible. Allow the piston or ball to fall to the bottom of the column. 7. Remove the mouthpiece from your mouth and breathe out normally. 8. Rest for a few seconds and repeat Steps 1 through 7 at least 10 times every 1-2 hours when you are awake. Take your time and take a few normal  breaths between deep breaths. 9. The spirometer may include an indicator to show your best effort. Use the indicator as a goal to work toward during each repetition. 10. After each set of 10 deep breaths, practice coughing to be sure your lungs are clear. If you have an incision (the cut made at the time of surgery), support your incision when coughing by placing a pillow or rolled up towels firmly against it. Once you are able to get out of bed, walk around indoors and cough well. You may stop using the incentive spirometer when instructed by your caregiver.  RISKS AND COMPLICATIONS  Take your time so you do not get dizzy or light-headed.  If you are in pain, you may need to take or ask for pain medication before doing incentive spirometry. It is harder to take a deep breath if you  are having pain. AFTER USE  Rest and breathe slowly and easily.  It can be helpful to keep track of a log of your progress. Your caregiver can provide you with a simple table to help with this. If you are using the spirometer at home, follow these instructions: [place] IF:   You are having difficultly using the spirometer.  You have trouble using the spirometer as often as instructed.  Your pain medication is not giving enough relief while using the spirometer.  You develop fever of 100.5 F (38.1 C) or higher. SEEK IMMEDIATE MEDICAL CARE IF:   You cough up bloody sputum that had not been present before.  You develop fever of 102 F (38.9 C) or greater.  You develop worsening pain at or near the incision site. MAKE SURE YOU:   Understand these instructions.  Will watch your condition.  Will get help right away if you are not doing well or get worse. Document Released: 05/25/2006 Document Revised: 04/06/2011 Document Reviewed: 07/26/2006 ExitCare Patient Information 2014 ExitCare, [place].   ________________________________________________________________________    Please read over  the following fact sheets that you were given: MRSA Information, coughing and deep breathing exercises, leg exercises

## 2013-09-19 ENCOUNTER — Encounter (HOSPITAL_COMMUNITY)
Admission: RE | Admit: 2013-09-19 | Discharge: 2013-09-19 | Disposition: A | Discharge status: Home / Self Care | Payer: No Typology Code available for payment source | Source: Ambulatory Visit | Attending: Orthopedic Surgery | Admitting: Orthopedic Surgery

## 2013-09-19 ENCOUNTER — Ambulatory Visit (HOSPITAL_COMMUNITY)
Admission: RE | Admit: 2013-09-19 | Discharge: 2013-09-19 | Disposition: A | Discharge status: Home / Self Care | Payer: No Typology Code available for payment source | Source: Ambulatory Visit | Attending: Orthopedic Surgery | Admitting: Orthopedic Surgery

## 2013-09-19 ENCOUNTER — Encounter (HOSPITAL_COMMUNITY): Payer: Self-pay

## 2013-09-19 DIAGNOSIS — R0602 Shortness of breath: Secondary | ICD-10-CM | POA: Diagnosis not present

## 2013-09-19 DIAGNOSIS — R0989 Other specified symptoms and signs involving the circulatory and respiratory systems: Secondary | ICD-10-CM | POA: Diagnosis present

## 2013-09-19 DIAGNOSIS — R079 Chest pain, unspecified: Secondary | ICD-10-CM | POA: Insufficient documentation

## 2013-09-19 LAB — BASIC METABOLIC PANEL
ANION GAP: 14 (ref 5–15)
BUN: 12 mg/dL (ref 6–23)
CO2: 27 mEq/L (ref 19–32)
Calcium: 10.1 mg/dL (ref 8.4–10.5)
Chloride: 100 mEq/L (ref 96–112)
Creatinine, Ser: 0.76 mg/dL (ref 0.50–1.10)
GFR calc Af Amer: >90 mL/min (ref >90)
GFR, EST NON AFRICAN AMERICAN: 84 mL/min — AB (ref >90)
Glucose, Bld: 170 mg/dL — ABNORMAL HIGH (ref 70–99)
Potassium: 4.2 mEq/L (ref 3.7–5.3)
SODIUM: 141 meq/L (ref 137–147)

## 2013-09-19 LAB — URINE MICROSCOPIC-ADD ON

## 2013-09-19 LAB — URINALYSIS, ROUTINE W REFLEX MICROSCOPIC
Bilirubin Urine: NEGATIVE
GLUCOSE, UA: NEGATIVE mg/dL
Ketones, ur: NEGATIVE mg/dL
Nitrite: POSITIVE — AB
PROTEIN: 100 mg/dL — AB
Specific Gravity, Urine: 1.025 (ref 1.005–1.030)
Urobilinogen, UA: 1 mg/dL (ref 0.0–1.0)
pH: 5.5 (ref 5.0–8.0)

## 2013-09-19 LAB — APTT: aPTT: 33 seconds (ref 24–37)

## 2013-09-19 LAB — CBC
HCT: 41.7 % (ref 36.0–46.0)
Hemoglobin: 13.7 g/dL (ref 12.0–15.0)
MCH: 29.1 pg (ref 26.0–34.0)
MCHC: 32.9 g/dL (ref 30.0–36.0)
MCV: 88.5 fL (ref 78.0–100.0)
Platelets: 362 10*3/uL (ref 150–400)
RBC: 4.71 MIL/uL (ref 3.87–5.11)
RDW: 13 % (ref 11.5–15.5)
WBC: 12.1 10*3/uL — AB (ref 4.0–10.5)

## 2013-09-19 LAB — PROTIME-INR
INR: 1.03 (ref 0.00–1.49)
PROTHROMBIN TIME: 13.5 s (ref 11.6–15.2)

## 2013-09-19 LAB — SURGICAL PCR SCREEN
MRSA, PCR: NEGATIVE
Staphylococcus aureus: NEGATIVE

## 2013-09-19 NOTE — Progress Notes (Signed)
Urinalysis and micro results along with CBC faxed via EPIC to Dr Alvan Dame.

## 2013-09-20 LAB — ABO/RH: ABO/RH(D): O POS

## 2013-09-24 NOTE — H&P (Signed)
TOTAL KNEE ADMISSION H&P  Patient is being admitted for left total knee arthroplasty.  Subjective:  Chief Complaint:   Left knee OA / pain.  HPI: Shannon Lynch, 68 y.o. female, has a history of pain and functional disability in the left knee due to arthritis and has failed non-surgical conservative treatments for greater than 12 weeks to includeNSAID's and/or analgesics, corticosteriod injections, viscosupplementation injections and activity modification.  Onset of symptoms was gradual, starting 5+ years ago with gradually worsening course since that time. The patient noted prior procedures on the knee to include  arthroscopy on the left knee(s).  Patient currently rates pain in the left knee(s) at 10 out of 10 with activity. Patient has night pain, worsening of pain with activity and weight bearing, pain that interferes with activities of daily living, pain with passive range of motion, crepitus and joint swelling.  Patient has evidence of periarticular osteophytes and joint space narrowing by imaging studies. There is no active infection.  Risks, benefits and expectations were discussed with the patient.  Risks including but not limited to the risk of anesthesia, blood clots, nerve damage, blood vessel damage, failure of the prosthesis, infection and up to and including death.  Patient understand the risks, benefits and expectations and wishes to proceed with surgery.   PCP: Thressa Sheller, MD  D/C Plans:      Home with HHPT  Post-op Meds:       No Rx given  Tranexamic Acid:      To be given - IV    Decadron:      Is not to be given - hypersensitive  FYI:     ASA post-op  Norco post-op   Patient Active Problem List   Diagnosis Date Noted  . Fracture of right shoulder 02/07/2011  . Type 2 diabetes mellitus with hyperosmolar nonketotic hyperglycemia 02/06/2011  . Bronchitis 12/17/2010  . LOW BACK PAIN, ACUTE 12/30/2009  . RENAL COLIC 58/83/2549  . EXOGENOUS OBESITY 07/21/2007   . ANXIETY DEPRESSION 07/21/2007  . COPD, MILD 07/21/2007  . HYPERTENSION 12/01/2006   Past Medical History  Diagnosis Date  . Hypertension   . Exogenous obesity   . Arthritis   . Anxiety   . Depression   . Diabetes mellitus without complication   . Chronic UTI (urinary tract infection)   . Osteonecrosis   . Shortness of breath     with exertion     Past Surgical History  Procedure Laterality Date  . Total knee arthroplasty    . Tubal ligation    . Cataract surgery     . Finger surgery      related to tumors on finger removal     No prescriptions prior to admission   Allergies  Allergen Reactions  . Ciprofloxacin Hives  . Codeine Nausea And Vomiting  . Prednisone     Elevated glucose level to 700    History  Substance Use Topics  . Smoking status: Former Smoker    Types: Cigarettes    Quit date: 06/23/2010  . Smokeless tobacco: Never Used     Comment: E-Cigarette  . Alcohol Use: No    Family History  Problem Relation Age of Onset  . Emphysema Father      Review of Systems  Constitutional: Negative.   HENT: Negative.   Eyes: Negative.   Respiratory: Positive for cough, shortness of breath (on exertion) and wheezing.   Cardiovascular: Negative.   Gastrointestinal: Negative.   Genitourinary: Positive for urgency  and frequency.  Musculoskeletal: Positive for back pain and joint pain.  Skin: Negative.   Neurological: Negative.   Endo/Heme/Allergies: Negative.   Psychiatric/Behavioral: Positive for depression. The patient is nervous/anxious.     Objective:  Physical Exam  Constitutional: She is oriented to person, place, and time. She appears well-developed and well-nourished.  HENT:  Head: Normocephalic and atraumatic.  Eyes: Pupils are equal, round, and reactive to light.  Neck: Neck supple. No JVD present. No tracheal deviation present. No thyromegaly present.  Cardiovascular: Normal rate, regular rhythm, normal heart sounds and intact distal  pulses.   Respiratory: Effort normal and breath sounds normal. No stridor. No respiratory distress.  GI: Soft. There is no tenderness. There is no guarding.  Musculoskeletal:       Left knee: She exhibits decreased range of motion, swelling and bony tenderness. She exhibits no ecchymosis, no deformity, no laceration and no erythema. Tenderness found.  Lymphadenopathy:    She has no cervical adenopathy.  Neurological: She is alert and oriented to person, place, and time.  Skin: Skin is warm and dry.  Psychiatric: She has a normal mood and affect.    Labs:  Estimated body mass index is 43.94 kg/(m^2) as calculated from the following:   Height as of 06/22/12: 5' (1.524 m).   Weight as of 06/22/12: 102.059 kg (225 lb).   Imaging Review Plain radiographs demonstrate severe degenerative joint disease of the left knee(s). The overall alignment isneutral. The bone quality appears to be good for age and reported activity level.  Assessment/Plan:  End stage arthritis, left knee   The patient history, physical examination, clinical judgment of the provider and imaging studies are consistent with end stage degenerative joint disease of the left knee(s) and total knee arthroplasty is deemed medically necessary. The treatment options including medical management, injection therapy arthroscopy and arthroplasty were discussed at length. The risks and benefits of total knee arthroplasty were presented and reviewed. The risks due to aseptic loosening, infection, stiffness, patella tracking problems, thromboembolic complications and other imponderables were discussed. The patient acknowledged the explanation, agreed to proceed with the plan and consent was signed. Patient is being admitted for inpatient treatment for surgery, pain control, PT, OT, prophylactic antibiotics, VTE prophylaxis, progressive ambulation and ADL's and discharge planning. The patient is planning to be discharged home with home health  services.    West Pugh Egon Dittus   PA-C  09/24/2013, 12:42 PM

## 2013-09-25 NOTE — Progress Notes (Signed)
Pt contacted in regards to surgery schedule time change pt aware of need to be at hospital at 6 am. Pt reinformed of no food or liquids after midnight.

## 2013-09-26 ENCOUNTER — Encounter (HOSPITAL_COMMUNITY): Payer: No Typology Code available for payment source | Admitting: Anesthesiology

## 2013-09-26 ENCOUNTER — Inpatient Hospital Stay (HOSPITAL_COMMUNITY)
Admission: RE | Admit: 2013-09-26 | Discharge: 2013-09-27 | DRG: 470 | Disposition: A | Discharge status: Home Health | Payer: No Typology Code available for payment source | Source: Ambulatory Visit | Attending: Orthopedic Surgery | Admitting: Orthopedic Surgery

## 2013-09-26 ENCOUNTER — Encounter (HOSPITAL_COMMUNITY): Payer: Self-pay | Admitting: *Deleted

## 2013-09-26 ENCOUNTER — Encounter (HOSPITAL_COMMUNITY)
Admission: RE | Disposition: A | Discharge status: Home Health | Payer: Self-pay | Source: Ambulatory Visit | Attending: Orthopedic Surgery

## 2013-09-26 ENCOUNTER — Inpatient Hospital Stay (HOSPITAL_COMMUNITY): Payer: No Typology Code available for payment source | Admitting: Anesthesiology

## 2013-09-26 DIAGNOSIS — Z87891 Personal history of nicotine dependence: Secondary | ICD-10-CM

## 2013-09-26 DIAGNOSIS — Z6841 Body Mass Index (BMI) 40.0 and over, adult: Secondary | ICD-10-CM

## 2013-09-26 DIAGNOSIS — Z96652 Presence of left artificial knee joint: Secondary | ICD-10-CM

## 2013-09-26 DIAGNOSIS — M6580 Other synovitis and tenosynovitis, unspecified site: Secondary | ICD-10-CM | POA: Diagnosis present

## 2013-09-26 DIAGNOSIS — M25569 Pain in unspecified knee: Secondary | ICD-10-CM | POA: Diagnosis present

## 2013-09-26 DIAGNOSIS — J449 Chronic obstructive pulmonary disease, unspecified: Secondary | ICD-10-CM | POA: Diagnosis present

## 2013-09-26 DIAGNOSIS — M1710 Unilateral primary osteoarthritis, unspecified knee: Secondary | ICD-10-CM | POA: Diagnosis present

## 2013-09-26 DIAGNOSIS — I10 Essential (primary) hypertension: Secondary | ICD-10-CM | POA: Diagnosis present

## 2013-09-26 DIAGNOSIS — E119 Type 2 diabetes mellitus without complications: Secondary | ICD-10-CM | POA: Diagnosis present

## 2013-09-26 DIAGNOSIS — J4489 Other specified chronic obstructive pulmonary disease: Secondary | ICD-10-CM | POA: Diagnosis present

## 2013-09-26 DIAGNOSIS — Z96659 Presence of unspecified artificial knee joint: Secondary | ICD-10-CM

## 2013-09-26 HISTORY — PX: TOTAL KNEE ARTHROPLASTY: SHX125

## 2013-09-26 LAB — TYPE AND SCREEN
ABO/RH(D): O POS
Antibody Screen: NEGATIVE

## 2013-09-26 LAB — GLUCOSE, CAPILLARY
GLUCOSE-CAPILLARY: 157 mg/dL — AB (ref 70–99)
GLUCOSE-CAPILLARY: 121 mg/dL — AB (ref 70–99)
GLUCOSE-CAPILLARY: 132 mg/dL — AB (ref 70–99)
Glucose-Capillary: 156 mg/dL — ABNORMAL HIGH (ref 70–99)

## 2013-09-26 LAB — HEMOGLOBIN A1C
Hgb A1c MFr Bld: 6.9 % — ABNORMAL HIGH (ref <5.7)
Mean Plasma Glucose: 151 mg/dL — ABNORMAL HIGH (ref <117)

## 2013-09-26 SURGERY — ARTHROPLASTY, KNEE, TOTAL
Anesthesia: Spinal | Site: Knee | Laterality: Left

## 2013-09-26 MED ORDER — INSULIN ASPART 100 UNIT/ML ~~LOC~~ SOLN: 0.0000 [IU] | Freq: Every day | SUBCUTANEOUS | Status: DC

## 2013-09-26 MED ORDER — FENTANYL CITRATE 0.05 MG/ML IJ SOLN
INTRAMUSCULAR | Status: DC | PRN
  Administered 2013-09-26 (×2): 100 ug via INTRAVENOUS

## 2013-09-26 MED ORDER — CLORAZEPATE DIPOTASSIUM 3.75 MG PO TABS
1.8750 mg | ORAL_TABLET | Freq: Every day | ORAL | Status: DC
  Filled 2013-09-26: qty 1

## 2013-09-26 MED ORDER — BUPIVACAINE-EPINEPHRINE (PF) 0.25% -1:200000 IJ SOLN
INTRAMUSCULAR | Status: DC | PRN
  Administered 2013-09-26: 30 mL

## 2013-09-26 MED ORDER — POLYVINYL ALCOHOL 1.4 % OP SOLN
1.0000 [drp] | Freq: Three times a day (TID) | OPHTHALMIC | Status: DC | PRN
  Filled 2013-09-26: qty 15

## 2013-09-26 MED ORDER — METHOCARBAMOL 1000 MG/10ML IJ SOLN
500.0000 mg | Freq: Four times a day (QID) | INTRAVENOUS | Status: DC | PRN
  Filled 2013-09-26: qty 5

## 2013-09-26 MED ORDER — ONDANSETRON HCL 4 MG/2ML IJ SOLN
INTRAMUSCULAR | Status: AC
  Filled 2013-09-26: qty 2

## 2013-09-26 MED ORDER — CELECOXIB 200 MG PO CAPS
200.0000 mg | ORAL_CAPSULE | Freq: Two times a day (BID) | ORAL | Status: DC
  Filled 2013-09-26 (×3): qty 1

## 2013-09-26 MED ORDER — KETOROLAC TROMETHAMINE 30 MG/ML IJ SOLN
INTRAMUSCULAR | Status: DC | PRN
  Administered 2013-09-26: 30 mg

## 2013-09-26 MED ORDER — ACETAMINOPHEN 325 MG PO TABS: 650.0000 mg | ORAL_TABLET | Freq: Four times a day (QID) | ORAL | Status: DC | PRN

## 2013-09-26 MED ORDER — LIDOCAINE HCL (CARDIAC) 20 MG/ML IV SOLN
INTRAVENOUS | Status: DC | PRN
  Administered 2013-09-26: 100 mg via INTRAVENOUS

## 2013-09-26 MED ORDER — SODIUM CHLORIDE 0.9 % IJ SOLN
INTRAMUSCULAR | Status: DC | PRN
  Administered 2013-09-26: 9 mL

## 2013-09-26 MED ORDER — DIPHENHYDRAMINE HCL 25 MG PO CAPS: 25.0000 mg | ORAL_CAPSULE | Freq: Four times a day (QID) | ORAL | Status: DC | PRN

## 2013-09-26 MED ORDER — FENTANYL CITRATE 0.05 MG/ML IJ SOLN
INTRAMUSCULAR | Status: AC
  Filled 2013-09-26: qty 2

## 2013-09-26 MED ORDER — CHLORHEXIDINE GLUCONATE 4 % EX LIQD: 60.0000 mL | Freq: Once | CUTANEOUS | Status: DC

## 2013-09-26 MED ORDER — DULOXETINE HCL 60 MG PO CPEP
60.0000 mg | ORAL_CAPSULE | Freq: Every morning | ORAL | Status: DC
  Administered 2013-09-27: 60 mg via ORAL
  Filled 2013-09-26: qty 1

## 2013-09-26 MED ORDER — MEPERIDINE HCL 50 MG/ML IJ SOLN: 6.2500 mg | INTRAMUSCULAR | Status: DC | PRN

## 2013-09-26 MED ORDER — BUPIVACAINE HCL (PF) 0.5 % IJ SOLN
INTRAMUSCULAR | Status: AC
  Filled 2013-09-26: qty 30

## 2013-09-26 MED ORDER — CLORAZEPATE DIPOTASSIUM 7.5 MG PO TABS
3.7500 mg | ORAL_TABLET | Freq: Two times a day (BID) | ORAL | Status: DC
  Administered 2013-09-26: 21:00:00 via ORAL
  Administered 2013-09-27: 3.75 mg via ORAL
  Filled 2013-09-26 (×2): qty 1

## 2013-09-26 MED ORDER — KETOROLAC TROMETHAMINE 30 MG/ML IJ SOLN
INTRAMUSCULAR | Status: AC
  Filled 2013-09-26: qty 1

## 2013-09-26 MED ORDER — ONDANSETRON HCL 4 MG PO TABS: 4.0000 mg | ORAL_TABLET | Freq: Four times a day (QID) | ORAL | Status: DC | PRN

## 2013-09-26 MED ORDER — FENTANYL CITRATE 0.05 MG/ML IJ SOLN: 25.0000 ug | INTRAMUSCULAR | Status: DC | PRN

## 2013-09-26 MED ORDER — CEFAZOLIN SODIUM-DEXTROSE 2-3 GM-% IV SOLR
2.0000 g | Freq: Four times a day (QID) | INTRAVENOUS | Status: AC
  Administered 2013-09-26 (×2): 2 g via INTRAVENOUS
  Filled 2013-09-26 (×2): qty 50

## 2013-09-26 MED ORDER — MAGNESIUM CITRATE PO SOLN: 1.0000 | Freq: Once | ORAL | Status: AC | PRN

## 2013-09-26 MED ORDER — CEFAZOLIN SODIUM-DEXTROSE 2-3 GM-% IV SOLR
2.0000 g | INTRAVENOUS | Status: AC
Start: 2013-09-26 — End: 2013-09-26
  Administered 2013-09-26: 2 g via INTRAVENOUS

## 2013-09-26 MED ORDER — ACETAMINOPHEN 500 MG PO TABS
1000.0000 mg | ORAL_TABLET | Freq: Four times a day (QID) | ORAL | Status: DC
  Administered 2013-09-26 – 2013-09-27 (×3): 1000 mg via ORAL
  Filled 2013-09-26 (×3): qty 2

## 2013-09-26 MED ORDER — METOCLOPRAMIDE HCL 10 MG PO TABS: 5.0000 mg | ORAL_TABLET | Freq: Three times a day (TID) | ORAL | Status: DC | PRN

## 2013-09-26 MED ORDER — MIDAZOLAM HCL 5 MG/5ML IJ SOLN
INTRAMUSCULAR | Status: DC | PRN
  Administered 2013-09-26: 2 mg via INTRAVENOUS

## 2013-09-26 MED ORDER — LIDOCAINE HCL (CARDIAC) 20 MG/ML IV SOLN
INTRAVENOUS | Status: AC
  Filled 2013-09-26: qty 5

## 2013-09-26 MED ORDER — ONDANSETRON HCL 4 MG/2ML IJ SOLN
INTRAMUSCULAR | Status: DC | PRN
  Administered 2013-09-26: 4 mg via INTRAVENOUS

## 2013-09-26 MED ORDER — PROPOFOL 10 MG/ML IV BOLUS
INTRAVENOUS | Status: AC
  Filled 2013-09-26: qty 20

## 2013-09-26 MED ORDER — MENTHOL 3 MG MT LOZG: 1.0000 | LOZENGE | OROMUCOSAL | Status: DC | PRN

## 2013-09-26 MED ORDER — CEFAZOLIN SODIUM-DEXTROSE 2-3 GM-% IV SOLR
INTRAVENOUS | Status: AC
  Filled 2013-09-26: qty 50

## 2013-09-26 MED ORDER — ONDANSETRON HCL 4 MG/2ML IJ SOLN: 4.0000 mg | Freq: Four times a day (QID) | INTRAMUSCULAR | Status: DC | PRN

## 2013-09-26 MED ORDER — 0.9 % SODIUM CHLORIDE (POUR BTL) OPTIME
TOPICAL | Status: DC | PRN
  Administered 2013-09-26: 1000 mL

## 2013-09-26 MED ORDER — OXYCODONE HCL 5 MG PO TABS: 5.0000 mg | ORAL_TABLET | ORAL | Status: DC | PRN

## 2013-09-26 MED ORDER — HYDROMORPHONE HCL PF 1 MG/ML IJ SOLN
INTRAMUSCULAR | Status: DC | PRN
  Administered 2013-09-26: 0.5 mg via INTRAVENOUS
  Administered 2013-09-26: 1 mg via INTRAVENOUS

## 2013-09-26 MED ORDER — SODIUM CHLORIDE 0.9 % IV SOLN
INTRAVENOUS | Status: DC
  Administered 2013-09-26: 15:00:00 via INTRAVENOUS
  Filled 2013-09-26 (×5): qty 1000

## 2013-09-26 MED ORDER — ZOLPIDEM TARTRATE 5 MG PO TABS
5.0000 mg | ORAL_TABLET | Freq: Every evening | ORAL | Status: DC | PRN
Start: 2013-09-26 — End: 2013-09-27

## 2013-09-26 MED ORDER — METFORMIN HCL ER 750 MG PO TB24
1500.0000 mg | ORAL_TABLET | Freq: Every day | ORAL | Status: DC
  Administered 2013-09-27: 1500 mg via ORAL
  Filled 2013-09-26 (×2): qty 2

## 2013-09-26 MED ORDER — BUPIVACAINE LIPOSOME 1.3 % IJ SUSP
20.0000 mL | Freq: Once | INTRAMUSCULAR | Status: AC
  Administered 2013-09-26: 20 mL
  Filled 2013-09-26: qty 20

## 2013-09-26 MED ORDER — METOCLOPRAMIDE HCL 5 MG/ML IJ SOLN: 5.0000 mg | Freq: Three times a day (TID) | INTRAMUSCULAR | Status: DC | PRN

## 2013-09-26 MED ORDER — ACETAMINOPHEN 650 MG RE SUPP: 650.0000 mg | Freq: Four times a day (QID) | RECTAL | Status: DC | PRN

## 2013-09-26 MED ORDER — LABETALOL HCL 5 MG/ML IV SOLN
INTRAVENOUS | Status: DC | PRN
  Administered 2013-09-26: 5 mg via INTRAVENOUS

## 2013-09-26 MED ORDER — PHENOL 1.4 % MT LIQD
1.0000 | OROMUCOSAL | Status: DC | PRN
Start: 2013-09-26 — End: 2013-09-27

## 2013-09-26 MED ORDER — BISACODYL 5 MG PO TBEC: 5.0000 mg | DELAYED_RELEASE_TABLET | Freq: Every day | ORAL | Status: DC | PRN

## 2013-09-26 MED ORDER — LOSARTAN POTASSIUM 50 MG PO TABS
100.0000 mg | ORAL_TABLET | Freq: Every morning | ORAL | Status: DC
  Administered 2013-09-26 – 2013-09-27 (×2): 100 mg via ORAL
  Filled 2013-09-26 (×2): qty 2

## 2013-09-26 MED ORDER — TRANEXAMIC ACID 100 MG/ML IV SOLN
1000.0000 mg | Freq: Once | INTRAVENOUS | Status: AC
  Administered 2013-09-26: 1000 mg via INTRAVENOUS
  Filled 2013-09-26: qty 10

## 2013-09-26 MED ORDER — MIDAZOLAM HCL 2 MG/2ML IJ SOLN
INTRAMUSCULAR | Status: AC
  Filled 2013-09-26: qty 2

## 2013-09-26 MED ORDER — LABETALOL HCL 5 MG/ML IV SOLN
INTRAVENOUS | Status: AC
  Filled 2013-09-26: qty 4

## 2013-09-26 MED ORDER — PROMETHAZINE HCL 25 MG/ML IJ SOLN: 6.2500 mg | INTRAMUSCULAR | Status: DC | PRN

## 2013-09-26 MED ORDER — PROPOFOL 10 MG/ML IV BOLUS
INTRAVENOUS | Status: DC | PRN
  Administered 2013-09-26: 200 mg via INTRAVENOUS

## 2013-09-26 MED ORDER — METHOCARBAMOL 500 MG PO TABS
500.0000 mg | ORAL_TABLET | Freq: Four times a day (QID) | ORAL | Status: DC | PRN
  Administered 2013-09-27: 500 mg via ORAL
  Filled 2013-09-26: qty 1

## 2013-09-26 MED ORDER — LACTATED RINGERS IV SOLN
INTRAVENOUS | Status: DC | PRN
  Administered 2013-09-26 (×2): via INTRAVENOUS

## 2013-09-26 MED ORDER — ALUM & MAG HYDROXIDE-SIMETH 200-200-20 MG/5ML PO SUSP: 30.0000 mL | ORAL | Status: DC | PRN

## 2013-09-26 MED ORDER — BUPIVACAINE-EPINEPHRINE (PF) 0.25% -1:200000 IJ SOLN
INTRAMUSCULAR | Status: AC
  Filled 2013-09-26: qty 30

## 2013-09-26 MED ORDER — LINAGLIPTIN 5 MG PO TABS
5.0000 mg | ORAL_TABLET | Freq: Every day | ORAL | Status: DC
  Administered 2013-09-27: 5 mg via ORAL
  Filled 2013-09-26: qty 1

## 2013-09-26 MED ORDER — SODIUM CHLORIDE 0.9 % IR SOLN
Status: DC | PRN
  Administered 2013-09-26: 1000 mL

## 2013-09-26 MED ORDER — DOCUSATE SODIUM 100 MG PO CAPS
100.0000 mg | ORAL_CAPSULE | Freq: Two times a day (BID) | ORAL | Status: DC
  Administered 2013-09-26 – 2013-09-27 (×2): 100 mg via ORAL

## 2013-09-26 MED ORDER — ASPIRIN EC 325 MG PO TBEC
325.0000 mg | DELAYED_RELEASE_TABLET | Freq: Every day | ORAL | Status: DC
  Administered 2013-09-27: 325 mg via ORAL
  Filled 2013-09-26 (×2): qty 1

## 2013-09-26 MED ORDER — HYDROMORPHONE HCL PF 1 MG/ML IJ SOLN: 0.5000 mg | INTRAMUSCULAR | Status: DC | PRN

## 2013-09-26 MED ORDER — HYDROMORPHONE HCL PF 2 MG/ML IJ SOLN
INTRAMUSCULAR | Status: AC
  Filled 2013-09-26: qty 1

## 2013-09-26 MED ORDER — POLYETHYLENE GLYCOL 3350 17 G PO PACK: 17.0000 g | PACK | Freq: Every day | ORAL | Status: DC | PRN

## 2013-09-26 MED ORDER — SODIUM CHLORIDE 0.9 % IJ SOLN
INTRAMUSCULAR | Status: AC
  Filled 2013-09-26: qty 10

## 2013-09-26 MED ORDER — SUCCINYLCHOLINE CHLORIDE 20 MG/ML IJ SOLN
INTRAMUSCULAR | Status: DC | PRN
  Administered 2013-09-26: 100 mg via INTRAVENOUS

## 2013-09-26 MED ORDER — INSULIN ASPART 100 UNIT/ML ~~LOC~~ SOLN: 0.0000 [IU] | Freq: Three times a day (TID) | SUBCUTANEOUS | Status: DC

## 2013-09-26 MED ORDER — HYDROCODONE-ACETAMINOPHEN 7.5-325 MG PO TABS
1.0000 | ORAL_TABLET | ORAL | Status: DC
  Administered 2013-09-27: 1 via ORAL
  Filled 2013-09-26: qty 1

## 2013-09-26 MED ORDER — INSULIN ASPART 100 UNIT/ML ~~LOC~~ SOLN
4.0000 [IU] | Freq: Three times a day (TID) | SUBCUTANEOUS | Status: DC
  Administered 2013-09-27: 4 [IU] via SUBCUTANEOUS

## 2013-09-26 SURGICAL SUPPLY — 58 items
ADH SKN CLS APL DERMABOND .7 (GAUZE/BANDAGES/DRESSINGS) ×1
BAG SPEC THK2 15X12 ZIP CLS (MISCELLANEOUS)
BAG ZIPLOCK 12X15 (MISCELLANEOUS) IMPLANT
BANDAGE ELASTIC 6 VELCRO ST LF (GAUZE/BANDAGES/DRESSINGS) ×3 IMPLANT
BANDAGE ESMARK 6X9 LF (GAUZE/BANDAGES/DRESSINGS) ×1 IMPLANT
BLADE SAW SGTL 13.0X1.19X90.0M (BLADE) ×3 IMPLANT
BNDG CMPR 9X6 STRL LF SNTH (GAUZE/BANDAGES/DRESSINGS) ×1
BNDG ESMARK 6X9 LF (GAUZE/BANDAGES/DRESSINGS) ×3
BONE CEMENT GENTAMICIN (Cement) ×6 IMPLANT
BOWL SMART MIX CTS (DISPOSABLE) ×3 IMPLANT
CAPT RP KNEE ×2 IMPLANT
CEMENT BONE GENTAMICIN 40 (Cement) IMPLANT
CUFF TOURN SGL QUICK 34 (TOURNIQUET CUFF) ×3
CUFF TRNQT CYL 34X4X40X1 (TOURNIQUET CUFF) ×1 IMPLANT
DERMABOND ADVANCED (GAUZE/BANDAGES/DRESSINGS) ×2
DERMABOND ADVANCED .7 DNX12 (GAUZE/BANDAGES/DRESSINGS) ×1 IMPLANT
DRAPE EXTREMITY T 121X128X90 (DRAPE) ×3 IMPLANT
DRAPE POUCH INSTRU U-SHP 10X18 (DRAPES) ×3 IMPLANT
DRAPE U-SHAPE 47X51 STRL (DRAPES) ×3 IMPLANT
DRSG AQUACEL AG ADV 3.5X10 (GAUZE/BANDAGES/DRESSINGS) ×3 IMPLANT
DURAPREP 26ML APPLICATOR (WOUND CARE) ×6 IMPLANT
ELECT REM PT RETURN 9FT ADLT (ELECTROSURGICAL) ×3
ELECTRODE REM PT RTRN 9FT ADLT (ELECTROSURGICAL) ×1 IMPLANT
FACESHIELD WRAPAROUND (MASK) ×15 IMPLANT
FACESHIELD WRAPAROUND OR TEAM (MASK) ×5 IMPLANT
GLOVE BIOGEL M 7.0 STRL (GLOVE) ×4 IMPLANT
GLOVE BIOGEL PI IND STRL 6.5 (GLOVE) IMPLANT
GLOVE BIOGEL PI IND STRL 7.0 (GLOVE) IMPLANT
GLOVE BIOGEL PI IND STRL 7.5 (GLOVE) ×2 IMPLANT
GLOVE BIOGEL PI INDICATOR 6.5 (GLOVE) ×2
GLOVE BIOGEL PI INDICATOR 7.0 (GLOVE) ×2
GLOVE BIOGEL PI INDICATOR 7.5 (GLOVE) ×4
GLOVE ORTHO TXT STRL SZ7.5 (GLOVE) ×6 IMPLANT
GLOVE SURG SS PI 7.0 STRL IVOR (GLOVE) ×2 IMPLANT
GOWN SPEC L3 XXLG W/TWL (GOWN DISPOSABLE) ×3 IMPLANT
GOWN STRL REUS W/TWL LRG LVL3 (GOWN DISPOSABLE) ×3 IMPLANT
GOWN STRL REUS W/TWL XL LVL3 (GOWN DISPOSABLE) ×2 IMPLANT
HANDPIECE INTERPULSE COAX TIP (DISPOSABLE) ×3
KIT BASIN OR (CUSTOM PROCEDURE TRAY) ×3 IMPLANT
MANIFOLD NEPTUNE II (INSTRUMENTS) ×3 IMPLANT
NDL SAFETY ECLIPSE 18X1.5 (NEEDLE) ×1 IMPLANT
NEEDLE HYPO 18GX1.5 SHARP (NEEDLE) ×3
PACK TOTAL JOINT (CUSTOM PROCEDURE TRAY) ×3 IMPLANT
POSITIONER SURGICAL ARM (MISCELLANEOUS) ×3 IMPLANT
SET HNDPC FAN SPRY TIP SCT (DISPOSABLE) ×1 IMPLANT
SET PAD KNEE POSITIONER (MISCELLANEOUS) ×3 IMPLANT
SUCTION FRAZIER 12FR DISP (SUCTIONS) ×3 IMPLANT
SUT MNCRL AB 4-0 PS2 18 (SUTURE) ×3 IMPLANT
SUT VIC AB 1 CT1 36 (SUTURE) ×3 IMPLANT
SUT VIC AB 2-0 CT1 27 (SUTURE) ×9
SUT VIC AB 2-0 CT1 TAPERPNT 27 (SUTURE) ×3 IMPLANT
SUT VLOC 180 0 24IN GS25 (SUTURE) ×3 IMPLANT
SYR 50ML LL SCALE MARK (SYRINGE) ×3 IMPLANT
TOWEL OR 17X26 10 PK STRL BLUE (TOWEL DISPOSABLE) ×3 IMPLANT
TOWEL OR NON WOVEN STRL DISP B (DISPOSABLE) ×2 IMPLANT
TRAY FOLEY CATH 14FRSI W/METER (CATHETERS) ×3 IMPLANT
WATER STERILE IRR 1500ML POUR (IV SOLUTION) ×3 IMPLANT
WRAP KNEE MAXI GEL POST OP (GAUZE/BANDAGES/DRESSINGS) ×3 IMPLANT

## 2013-09-26 NOTE — Evaluation (Signed)
Physical Therapy Evaluation Patient Details Name: Shannon Lynch MRN: [id number] DOB: [birth year] Today's Date: 09/26/2013   History of Present Illness  Pt is a 68 year old female s/p L TKA.  Clinical Impression  Pt is s/p L TKA resulting in the deficits listed below (see PT Problem List).  Pt will benefit from skilled PT to increase their independence and safety with mobility to allow discharge to the venue listed below.  Pt ambulated short distance in hallway POD #0 and plans to d/c home with assist from her sister and spouse.     Follow Up Recommendations Home health PT    Equipment Recommendations  Rolling walker with 5" wheels (youth)    Recommendations for Other Services       Precautions / Restrictions Precautions Precautions: Knee Restrictions Other Position/Activity Restrictions: WBAT      Mobility  Bed Mobility Overal bed mobility: Needs Assistance Bed Mobility: Supine to Sit     Supine to sit: Min assist     General bed mobility comments: verbal cues for technique, assist for L LE  Transfers Overall transfer level: Needs assistance Equipment used: Rolling walker (2 wheeled) Transfers: Sit to/from Stand Sit to Stand: Min assist;From elevated surface         General transfer comment: verbal cues for safe technique  Ambulation/Gait Ambulation/Gait assistance: Min assist Ambulation Distance (Feet): 40 Feet Assistive device: Rolling walker (2 wheeled) Gait Pattern/deviations: Step-to pattern;Antalgic;Trunk flexed Gait velocity: decr   General Gait Details: verbal cues for sequence, RW positioning, posture  Stairs            Wheelchair Mobility    Modified Rankin (Stroke Patients Only)       Balance                                             Pertinent Vitals/Pain Pain Assessment: 0-10 Pain Score: 5  Pain Location: L knee Pain Descriptors / Indicators: Aching;Sore Pain Intervention(s): Limited activity  within patient's tolerance;Monitored during session;Repositioned;Ice applied    Home Living Family/patient expects to be discharged to:: Private residence Living Arrangements: Spouse/significant other Available Help at Discharge: Family;Available 24 hours/day (sister and spouse) Type of Home: House Home Access: Stairs to enter Entrance Stairs-Rails: Right Entrance Stairs-Number of Steps: 3 front and 5 in back Home Layout: One level Home Equipment: Other (comment) (handicap toilet height)      Prior Function Level of Independence: Independent               Hand Dominance        Extremity/Trunk Assessment               Lower Extremity Assessment: LLE deficits/detail   LLE Deficits / Details: fair quad contraction, assist for bed mobility     Communication   Communication: No difficulties  Cognition Arousal/Alertness: Awake/alert Behavior During Therapy: WFL for tasks assessed/performed Overall Cognitive Status: Within Functional Limits for tasks assessed                      General Comments      Exercises        Assessment/Plan    PT Assessment Patient needs continued PT services  PT Diagnosis Difficulty walking;Acute pain   PT Problem List Decreased strength;Decreased range of motion;Decreased mobility;Decreased knowledge of use of DME;Pain  PT Treatment Interventions Functional mobility training;Patient/family  education;Therapeutic activities;Therapeutic exercise;Gait training;DME instruction   PT Goals (Current goals can be found in the Care Plan section) Acute Rehab PT Goals PT Goal Formulation: With patient Time For Goal Achievement: 09/29/13 Potential to Achieve Goals: Good    Frequency 7X/week   Barriers to discharge        Co-evaluation               End of Session   Activity Tolerance: Patient tolerated treatment well Patient left: in chair;with call bell/phone within reach;with family/visitor present            Time: 6295-2841 PT Time Calculation (min): 16 min   Charges:   PT Evaluation $Initial PT Evaluation Tier I: 1 Procedure PT Treatments $Gait Training: 8-22 mins   PT G Codes:          Daschel Roughton,KATHrine E 09/26/2013, 4:25 PM Carmelia Bake, PT, DPT 09/26/2013 Pager: [phone]

## 2013-09-26 NOTE — Care Management Note (Signed)
    Page 1 of 2   09/27/2013     8:19:47 AM CARE MANAGEMENT NOTE 09/27/2013  Patient:  Shannon Lynch, Shannon Lynch   Account Number:  [id number]  Date Initiated:  09/26/2013  Documentation initiated by:  [facility]  Subjective/Objective Assessment:   adm: Left knee osteoarthritis/Left total knee replacement     Action/Plan:   discharge planning   Anticipated DC Date:  09/28/2013   Anticipated DC Plan:  [place]  CM consult      [facility]  HOME HEALTH   Choice offered to / List presented to:  C-1 Patient   DME arranged  Vassie Moselle      DME agency  [place] arranged  [place]   Status of service:  Completed, signed off Medicare Important Message given?  NA - LOS <3 / Initial given by admissions (If response is "NO", the following Medicare IM given date fields will be blank) Date Medicare IM given:   Medicare IM given by:   Date Additional Medicare IM given:   Additional Medicare IM given by:    Discharge Disposition:  [place]  Per UR Regulation:  Reviewed for med. necessity/level of care/duration of stay  If discussed at [place] of Stay Meetings, dates discussed:    Comments:  09/27/13 08:00 CM met with pt to offer choice.  Pt chooses [facility] for HHPT.  Pt states she does not need a 3n1 but DOES need a rolling walker.  Address and contact information verified.  Orders and F2F placed.  DME rep Lucretia to bring rolling walker to room prior to discharge.  Referral made to [place], Deb.  No other CM needs were communicated.  Mariane Masters, BSN, [place]. 09/26/13 15:12 UR complete. Mariane Masters, BSN, [place]

## 2013-09-26 NOTE — Op Note (Signed)
NAME:  Shannon Lynch [facility]                      MEDICAL RECORD NO.:  [id number]                             FACILITY:  [facility]      PHYSICIAN:  Pietro Cassis. Alvan Dame, M.D.  DATE OF BIRTH:  February 18, 1945      DATE OF PROCEDURE:  09/26/2013                                     OPERATIVE REPORT         PREOPERATIVE DIAGNOSIS:  Left knee osteoarthritis.      POSTOPERATIVE DIAGNOSIS:  Left knee osteoarthritis.      FINDINGS:  The patient was noted to have complete loss of cartilage and   bone-on-bone arthritis with associated osteophytes in the medial and patellofemoral compartments of   the knee with a significant synovitis and associated effusion.      PROCEDURE:  Left total knee replacement.      COMPONENTS USED:  DePuy rotating platform posterior stabilized knee   system, a size 2.5 femur, 3 tibia, 10 mm PS insert, and 35 patellar   button.      SURGEON:  Pietro Cassis. Alvan Dame, M.D.      ASSISTANT:  Nehemiah Massed, PA-C.      ANESTHESIA:  General.      SPECIMENS:  None.      COMPLICATION:  None.      DRAINS:  None.  EBL: <50cc      TOURNIQUET TIME:   Total Tourniquet Time Documented: Thigh (Left) - 35 minutes Total: Thigh (Left) - 35 minutes  .      The patient was stable to the recovery room.      INDICATION FOR PROCEDURE:  Shannon Lynch is a 68 y.o. female patient of   mine.  The patient had been seen, evaluated, and treated conservatively in the   office with medication, activity modification, and injections.  The patient had   radiographic changes of bone-on-bone arthritis with endplate sclerosis and osteophytes noted.      The patient failed conservative measures including medication, injections, and activity modification, and at this point was ready for more definitive measures.   Based on the radiographic changes and failed conservative measures, the patient   decided to proceed with total knee replacement.  Risks of infection,   DVT, component failure, need for  revision surgery, postop course, and   expectations were all   discussed and reviewed.  Consent was obtained for benefit of pain   relief.      PROCEDURE IN DETAIL:  The patient was brought to the operative theater.   Once adequate anesthesia, preoperative antibiotics, 2 gm of Ancef administered, the patient was positioned supine with the left thigh tourniquet placed.  The  left lower extremity was prepped and draped in sterile fashion.  A time-   out was performed identifying the patient, planned procedure, and   extremity.      The left lower extremity was placed in the [facility] leg holder.  The leg was   exsanguinated, tourniquet elevated to 250 mmHg.  A midline incision was   made followed by median parapatellar arthrotomy.  Following initial   exposure, attention was first  directed to the patella.  Precut   measurement was noted to be 23 mm.  I resected down to 14 mm and used a   35 patellar button to restore patellar height as well as cover the cut   surface.      The lug holes were drilled and a metal shim was placed to protect the   patella from retractors and saw blades.      At this point, attention was now directed to the femur.  The femoral   canal was opened with a drill, irrigated to try to prevent fat emboli.  An   intramedullary rod was passed at 3 degrees valgus, 9 mm of bone was   resected off the distal femur.  Following this resection, the tibia was   subluxated anteriorly.  Using the extramedullary guide, 10 mm of bone was resected off   the proximal lateral tibia.  We confirmed the gap would be   stable medially and laterally with a 10 mm insert as well as confirmed   the cut was perpendicular in the coronal plane, checking with an alignment rod.      Once this was done, I sized the femur to be a size 2.5 in the anterior-   posterior dimension, chose a standard component based on medial and   lateral dimension.  The size 2.5 rotation block was then pinned in    position anterior referenced using the C-clamp to set rotation.  The   anterior, posterior, and  chamfer cuts were made without difficulty nor   notching making certain that I was along the anterior cortex to help   with flexion gap stability.      The final box cut was made off the lateral aspect of distal femur.      At this point, the tibia was sized to be a size 3, the size 3 tray was   then pinned in position through the medial third of the tubercle,   drilled, and keel punched.  Trial reduction was now carried with a 2.5 femur,  3 tibia, a 10 mm insert, and the 35 patella botton.  The knee was brought to   extension, full extension with good flexion stability with the patella   tracking through the trochlea without application of pressure.  Given   all these findings, the trial components removed.  Final components were   opened and cement was mixed.  The knee was irrigated with normal saline   solution and pulse lavage.  The synovial lining was   then injected with 20cc of Exaprel, 30cc of 0.25% Marcaine with epinephrine and 1 cc of Toradol,   total of 61 cc.      The knee was irrigated.  Final implants were then cemented onto clean and   dried cut surfaces of bone with the knee brought to extension with a 10 mm trial insert.      Once the cement had fully cured, the excess cement was removed   throughout the knee.  I confirmed I was satisfied with the range of   motion and stability, and the final 10 mm PS insert was chosen.  It was   placed into the knee.      The tourniquet had been let down at 35 minutes.  No significant   hemostasis required.  The   extensor mechanism was then reapproximated using #1 Vicryl and #0 V-lock sutures with the knee   in flexion.  The  remaining wound was closed with 2-0 Vicryl and running 4-0 Monocryl.   The knee was cleaned, dried, dressed sterilely using Dermabond and   Aquacel dressing.  The patient was then   brought to recovery room in  stable condition, tolerating the procedure   well.   Please note that Physician Assistant, Nehemiah Massed, was present for the entirety of the case, and was utilized for pre-operative positioning, peri-operative retractor management, general facilitation of the procedure.  He was also utilized for primary wound closure at the end of the case.              Pietro Cassis Alvan Dame, M.D.    09/26/2013 10:58 AM

## 2013-09-26 NOTE — Interval H&P Note (Signed)
History and Physical Interval Note:  09/26/2013 7:09 AM  Shannon Lynch  has presented today for surgery, with the diagnosis of LEFT KNEE OA  The various methods of treatment have been discussed with the patient and family. After consideration of risks, benefits and other options for treatment, the patient has consented to  Procedure(s): LEFT TOTAL KNEE ARTHROPLASTY (Left) as a surgical intervention .  The patient's history has been reviewed, patient examined, no change in status, stable for surgery.  I have reviewed the patient's chart and labs.  Questions were answered to the patient's satisfaction.     Mauri Pole

## 2013-09-26 NOTE — Transfer of Care (Signed)
Immediate Anesthesia Transfer of Care Note  Patient: Shannon Lynch  Procedure(s) Performed: Procedure(s): LEFT TOTAL KNEE ARTHROPLASTY (Left)  Patient Location: PACU  Anesthesia Type:General  Level of Consciousness: sedated  Airway & Oxygen Therapy: Patient Spontanous Breathing and Patient connected to face mask oxygen  Post-op Assessment: Report given to PACU RN and Post -op Vital signs reviewed and stable  Post vital signs: Reviewed and stable  Complications: No apparent anesthesia complications

## 2013-09-26 NOTE — Anesthesia Preprocedure Evaluation (Addendum)
Anesthesia Evaluation  Patient identified by MRN, date of birth, ID band Patient awake    Reviewed: Allergy & Precautions, H&P , NPO status , Patient's Chart, lab work & pertinent test results  Airway Mallampati: II TM Distance: >3 FB Neck ROM: Full    Dental no notable dental hx.    Pulmonary COPDformer smoker,  breath sounds clear to auscultation  Pulmonary exam normal       Cardiovascular hypertension, Pt. on medications Rhythm:Regular Rate:Normal     Neuro/Psych negative neurological ROS  negative psych ROS   GI/Hepatic negative GI ROS, Neg liver ROS,   Endo/Other  diabetes, Type 2, Oral Hypoglycemic AgentsMorbid obesity  Renal/GU negative Renal ROS  negative genitourinary   Musculoskeletal negative musculoskeletal ROS (+)   Abdominal   Peds negative pediatric ROS (+)  Hematology negative hematology ROS (+)   Anesthesia Other Findings   Reproductive/Obstetrics negative OB ROS                          Anesthesia Physical Anesthesia Plan  ASA: III  Anesthesia Plan: Spinal   Post-op Pain Management:    Induction:   Airway Management Planned: Simple Face Mask  Additional Equipment:   Intra-op Plan:   Post-operative Plan:   Informed Consent: I have reviewed the patients History and Physical, chart, labs and discussed the procedure including the risks, benefits and alternatives for the proposed anesthesia with the patient or authorized representative who has indicated his/her understanding and acceptance.   Dental advisory given  Plan Discussed with: CRNA  Anesthesia Plan Comments:         Anesthesia Quick Evaluation

## 2013-09-26 NOTE — Anesthesia Postprocedure Evaluation (Signed)
  Anesthesia Post-op Note  Patient: Shannon Lynch  Procedure(s) Performed: Procedure(s) (LRB): LEFT TOTAL KNEE ARTHROPLASTY (Left)  Patient Location: PACU  Anesthesia Type: General  Level of Consciousness: awake and alert   Airway and Oxygen Therapy: Patient Spontanous Breathing  Post-op Pain: mild  Post-op Assessment: Post-op Vital signs reviewed, Patient's Cardiovascular Status Stable, Respiratory Function Stable, Patent Airway and No signs of Nausea or vomiting  Last Vitals:  Filed Vitals:   09/26/13 1217  BP: 137/69  Pulse: 87  Temp: 36.7 C  Resp:     Post-op Vital Signs: stable   Complications: No apparent anesthesia complications

## 2013-09-27 LAB — GLUCOSE, CAPILLARY: Glucose-Capillary: 144 mg/dL — ABNORMAL HIGH (ref 70–99)

## 2013-09-27 LAB — CBC
HCT: 34.8 % — ABNORMAL LOW (ref 36.0–46.0)
Hemoglobin: 11.1 g/dL — ABNORMAL LOW (ref 12.0–15.0)
MCH: 29.1 pg (ref 26.0–34.0)
MCHC: 31.9 g/dL (ref 30.0–36.0)
MCV: 91.3 fL (ref 78.0–100.0)
PLATELETS: 292 10*3/uL (ref 150–400)
RBC: 3.81 MIL/uL — AB (ref 3.87–5.11)
RDW: 13.5 % (ref 11.5–15.5)
WBC: 11.5 10*3/uL — ABNORMAL HIGH (ref 4.0–10.5)

## 2013-09-27 LAB — BASIC METABOLIC PANEL
ANION GAP: 9 (ref 5–15)
BUN: 9 mg/dL (ref 6–23)
CALCIUM: 8.9 mg/dL (ref 8.4–10.5)
CO2: 30 mEq/L (ref 19–32)
CREATININE: 0.74 mg/dL (ref 0.50–1.10)
Chloride: 103 mEq/L (ref 96–112)
GFR calc non Af Amer: 85 mL/min — ABNORMAL LOW (ref >90)
Glucose, Bld: 134 mg/dL — ABNORMAL HIGH (ref 70–99)
Potassium: 4.5 mEq/L (ref 3.7–5.3)
SODIUM: 142 meq/L (ref 137–147)

## 2013-09-27 MED ORDER — DSS 100 MG PO CAPS: 100.0000 mg | ORAL_CAPSULE | Freq: Two times a day (BID) | ORAL | Status: DC

## 2013-09-27 MED ORDER — ASPIRIN 325 MG PO TBEC: 325.0000 mg | DELAYED_RELEASE_TABLET | Freq: Two times a day (BID) | ORAL | Status: AC

## 2013-09-27 MED ORDER — HYDROCODONE-ACETAMINOPHEN 7.5-325 MG PO TABS: 1.0000 | ORAL_TABLET | ORAL | Status: DC

## 2013-09-27 MED ORDER — METHOCARBAMOL 500 MG PO TABS: 500.0000 mg | ORAL_TABLET | Freq: Four times a day (QID) | ORAL | Status: DC | PRN

## 2013-09-27 MED ORDER — POLYETHYLENE GLYCOL 3350 17 G PO PACK: 17.0000 g | PACK | Freq: Two times a day (BID) | ORAL | Status: DC

## 2013-09-27 MED ORDER — ACETAMINOPHEN 325 MG PO TABS: 650.0000 mg | ORAL_TABLET | Freq: Four times a day (QID) | ORAL | Status: DC | PRN

## 2013-09-27 MED ORDER — ASPIRIN 325 MG PO TBEC: 325.0000 mg | DELAYED_RELEASE_TABLET | Freq: Every day | ORAL | Status: DC

## 2013-09-27 NOTE — Evaluation (Signed)
Occupational Therapy Evaluation Patient Details Name: Shannon Lynch MRN: [id number] DOB: May 25, 1945 Today's Date: 09/27/2013    History of Present Illness Pt is a 68 year old female s/p L TKA.   Clinical Impression   Pt doing well. Practiced toilet transfers and discussed LB self care. Husband present. Pt plans to sponge bathe initially. Will follow while on acute. Pt with some "fuzziness" in the head with activity but able to continue. She states she feels like it may be the medicine.    Follow Up Recommendations  No OT follow up;Supervision/Assistance - 24 hour    Equipment Recommendations  None recommended by OT    Recommendations for Other Services       Precautions / Restrictions Precautions Precautions: Knee Restrictions Weight Bearing Restrictions: No Other Position/Activity Restrictions: WBAT      Mobility Bed Mobility Overal bed mobility: Needs Assistance Bed Mobility: Supine to Sit     Supine to sit: Min guard     General bed mobility comments: able to bring L LE over EOB herself.  Transfers Overall transfer level: Needs assistance Equipment used: Rolling walker (2 wheeled) Transfers: Sit to/from Stand Sit to Stand: Min guard         General transfer comment: verbal cues hand placement.    Balance                                            ADL Overall ADL's : Needs assistance/impaired Eating/Feeding: Independent;Sitting   Grooming: Wash/dry hands;Min guard;Standing   Upper Body Bathing: Set up;Sitting   Lower Body Bathing: Moderate assistance;Sit to/from stand   Upper Body Dressing : Set up;Sitting   Lower Body Dressing: Moderate assistance;Sit to/from stand   Toilet Transfer: Min guard;Minimal assistance;RW;Ambulation;Comfort height toilet;Grab bars   Toileting- Water quality scientist and Hygiene: Min guard;Sit to/from stand         General ADL Comments: Pt plans to sponge bathe initially. Husband present  and can assist with LB self care initially. Reviewed sequence for LB dressing and having walker in front when she stands to pull up clothing. She needed min cues to not twist around to wash hands at the sink but rather turn and stand at sink with walker. SHe has a vanity next to toilet and higher toilet.      Vision                     Perception     Praxis      Pertinent Vitals/Pain Pain Assessment: 0-10 Pain Score: 1  Pain Location: L knee Pain Descriptors / Indicators: Sore Pain Intervention(s): Repositioned;Ice applied     Hand Dominance     Extremity/Trunk Assessment Upper Extremity Assessment Upper Extremity Assessment: Overall WFL for tasks assessed           Communication Communication Communication: No difficulties   Cognition Arousal/Alertness: Awake/alert Behavior During Therapy: WFL for tasks assessed/performed Overall Cognitive Status: Within Functional Limits for tasks assessed                     General Comments       Exercises       Shoulder Instructions      Home Living Family/patient expects to be discharged to:: Private residence Living Arrangements: Spouse/significant other Available Help at Discharge: Family;Available 24 hours/day (sister and spouse) Type of Home: [place]  Access: Stairs to enter [employer] of Steps: 3 front and 5 in back Entrance Stairs-Rails: Right Home Layout: One level     Bathroom Shower/Tub: Tub/shower unit (downstairs, shower stall upstairs)   Bathroom Toilet: Handicapped height     Home Equipment: Other (comment);Shower seat - built in (handicap toilet height)          Prior Functioning/Environment Level of Independence: Independent             OT Diagnosis: Generalized weakness   OT Problem List: Decreased strength;Decreased knowledge of use of DME or AE   OT Treatment/Interventions: Self-care/ADL training;Patient/family education;Therapeutic activities;DME and/or  AE instruction    OT Goals(Current goals can be found in the care plan section) Acute Rehab OT Goals Patient Stated Goal: home OT Goal Formulation: With patient Time For Goal Achievement: 10/04/13 Potential to Achieve Goals: Good  OT Frequency: Min 2X/week   Barriers to D/C:            Co-evaluation              End of Session Equipment Utilized During Treatment: Rolling walker  Activity Tolerance: Patient tolerated treatment well Patient left: in chair;with call bell/phone within reach   Time: 0852-0919 OT Time Calculation (min): 27 min Charges:  OT General Charges $OT Visit: 1 Procedure OT Evaluation $Initial OT Evaluation Tier I: 1 Procedure OT Treatments $Self Care/Home Management : 8-22 mins $Therapeutic Activity: 8-22 mins G-Codes:    Jules Schick [phone] 09/27/2013, 9:27 AM

## 2013-09-27 NOTE — Progress Notes (Signed)
Physical Therapy Treatment Patient Details Name: Shannon Lynch MRN: [id number] DOB: March 22, 1945 Today's Date: 09/27/2013    History of Present Illness Pt is a 68 year old female s/p L TKA.    PT Comments    Pt ambulated in hallway, practiced steps, and performed LE exercises.  Spouse and pt's sister present for stair education and provided stair handout.  Pt also given HEP handout.  Pt had no further questions and feels ready for d/c home today.  Follow Up Recommendations  Home health PT     Equipment Recommendations  Rolling walker with 5" wheels (youth)    Recommendations for Other Services       Precautions / Restrictions Precautions Precautions: Knee Restrictions Weight Bearing Restrictions: No Other Position/Activity Restrictions: WBAT    Mobility  Bed Mobility Overal bed mobility: Needs Assistance Bed Mobility: Supine to Sit     Supine to sit: Min guard     General bed mobility comments: pt up in recliner on arrival  Transfers Overall transfer level: Needs assistance Equipment used: Rolling walker (2 wheeled) Transfers: Sit to/from Stand Sit to Stand: From elevated surface;Min guard         General transfer comment: verbal cues for safe technique  Ambulation/Gait Ambulation/Gait assistance: Min guard Ambulation Distance (Feet): 160 Feet Assistive device: Rolling walker (2 wheeled) Gait Pattern/deviations: Step-through pattern;Antalgic;Trunk flexed Gait velocity: decr   General Gait Details: verbal cues for step length, RW positioning, posture   Stairs Stairs: Yes Stairs assistance: Min guard Stair Management: Backwards;Step to pattern;With walker Number of Stairs: 2 General stair comments: verbal cues for sequence, RW positioning, spouse assisted with holding RW, sister also present and observed, provided handout  Wheelchair Mobility    Modified Rankin (Stroke Patients Only)       Balance                                     Cognition Arousal/Alertness: Awake/alert Behavior During Therapy: WFL for tasks assessed/performed Overall Cognitive Status: Within Functional Limits for tasks assessed                      Exercises Total Joint Exercises Ankle Circles/Pumps: AROM;Both;10 reps Quad Sets: AROM;Both;10 reps Short Arc Quad: AROM;Left;10 reps Heel Slides: AAROM;Left;10 reps Hip ABduction/ADduction: AAROM;Left;10 reps    General Comments        Pertinent Vitals/Pain Pain Assessment: 0-10 Pain Score: 5  Pain Location: L knee Pain Descriptors / Indicators: Aching;Sore Pain Intervention(s): Limited activity within patient's tolerance;Monitored during session;Premedicated before session;Ice applied    Home Living Family/patient expects to be discharged to:: Private residence Living Arrangements: Spouse/significant other Available Help at Discharge: Family;Available 24 hours/day (sister and spouse) Type of Home: House Home Access: Stairs to enter Entrance Stairs-Rails: Right Home Layout: One level Home Equipment: Other (comment);Shower seat - built in (handicap toilet height)      Prior Function Level of Independence: Independent          PT Goals (current goals can now be found in the care plan section) Acute Rehab PT Goals Patient Stated Goal: home Progress towards PT goals: Progressing toward goals    Frequency  7X/week    PT Plan Current plan remains appropriate    Co-evaluation             End of Session Equipment Utilized During Treatment: Gait belt Activity Tolerance: Patient tolerated treatment well Patient  left: in chair;with call bell/phone within reach;with family/visitor present     Time: 7169-6789 PT Time Calculation (min): 33 min  Charges:  $Gait Training: 8-22 mins $Therapeutic Exercise: 8-22 mins                    G Codes:      Shannon Lynch,Shannon Lynch October 23, 2013, 11:24 AM Shannon Lynch, PT, DPT 10/23/2013 Pager: [phone]

## 2013-09-27 NOTE — Progress Notes (Signed)
[facility]   [facility] is providing the following services: RW  If patient discharges after hours, please call [phone].   Shannon Lynch 09/27/2013, 9:06 AM

## 2013-09-27 NOTE — Progress Notes (Signed)
     Subjective: 1 Day Post-Op Procedure(s) (LRB): LEFT TOTAL KNEE ARTHROPLASTY (Left)   Patient reports pain as mild, pain controlled. No events throughout the night. Ready to be discharged home if he does well with PT and pain controled.   Objective:   VITALS:   Filed Vitals:   09/27/13 0535  BP: 147/83  Pulse: 77  Temp: 98.3 F (36.8 C)  Resp: 16    Dorsiflexion/Plantar flexion intact Incision: dressing C/D/I No cellulitis present Compartment soft  LABS  Recent Labs  09/27/13 0400  HGB 11.1*  HCT 34.8*  WBC 11.5*  PLT 292     Recent Labs  09/27/13 0400  NA 142  K 4.5  BUN 9  CREATININE 0.74  GLUCOSE 134*     Assessment/Plan: 1 Day Post-Op Procedure(s) (LRB): LEFT TOTAL KNEE ARTHROPLASTY (Left) Foley cath d/c'ed Advance diet Up with therapy D/C IV fluids Discharge home with home health Follow up in 2 weeks at [facility]. Follow up with OLIN,Dorsie Sethi D in 2 weeks.  Contact information:  [facility] [address], [place] [address] [phone]    Morbid Obesity (BMI >40)  Estimated body mass index is 42.91 kg/(m^2) as calculated from the following:   Height as of this encounter: 5\' 1"  (1.549 m).   Weight as of this encounter: 102.967 kg (227 lb). Patient also counseled that weight may inhibit the healing process Patient counseled that losing weight will help with future health issues        West Pugh. Danniella Robben   PAC  09/27/2013, 8:38 AM

## 2013-10-04 NOTE — Discharge Summary (Signed)
Physician Discharge Summary  Patient ID: VANISSA STRENGTH MRN: [id number] DOB/AGE: 1945-08-12 68 y.o.  Admit date: 09/26/2013 Discharge date: 09/27/2013   Procedures:  Procedure(s) (LRB): LEFT TOTAL KNEE ARTHROPLASTY (Left)  Attending Physician:  Dr. Paralee Cancel   Admission Diagnoses:   Left knee OA / pain  Discharge Diagnoses:  Principal Problem:   S/P left TKA Active Problems:   Morbid obesity  Past Medical History  Diagnosis Date  . Hypertension   . Exogenous obesity   . Arthritis   . Anxiety   . Depression   . Diabetes mellitus without complication   . Chronic UTI (urinary tract infection)   . Osteonecrosis   . Shortness of breath     with exertion     HPI: Shannon Lynch, 68 y.o. female, has a history of pain and functional disability in the left knee due to arthritis and has failed non-surgical conservative treatments for greater than 12 weeks to includeNSAID's and/or analgesics, corticosteriod injections, viscosupplementation injections and activity modification. Onset of symptoms was gradual, starting 5+ years ago with gradually worsening course since that time. The patient noted prior procedures on the knee to include arthroscopy on the left knee(s). Patient currently rates pain in the left knee(s) at 10 out of 10 with activity. Patient has night pain, worsening of pain with activity and weight bearing, pain that interferes with activities of daily living, pain with passive range of motion, crepitus and joint swelling. Patient has evidence of periarticular osteophytes and joint space narrowing by imaging studies. There is no active infection. Risks, benefits and expectations were discussed with the patient. Risks including but not limited to the risk of anesthesia, blood clots, nerve damage, blood vessel damage, failure of the prosthesis, infection and up to and including death. Patient understand the risks, benefits and expectations and wishes to proceed with  surgery.  PCP: Thressa Sheller, MD   Discharged Condition: good  Hospital Course:  Patient underwent the above stated procedure on 09/26/2013. Patient tolerated the procedure well and brought to the recovery room in good condition and subsequently to the floor.  POD #1 BP: 147/83 ; Pulse: 77 ; Temp: 98.3 F (36.8 C) ; Resp: 16  Patient reports pain as mild, pain controlled. No events throughout the night. Ready to be discharged home. Dorsiflexion/plantar flexion intact, incision: dressing C/D/I, no cellulitis present and compartment soft.   LABS  Basename    HGB  11.1  HCT  34.8    Discharge Exam: General appearance: alert, cooperative and no distress Extremities: Homans sign is negative, no sign of DVT, no edema, redness or tenderness in the calves or thighs and no ulcers, gangrene or trophic changes  Disposition:      Home with follow up in 2 weeks   Follow-up Information   Follow up with [facility]. (home health physical therapy)    Contact information:   [address] [place] [address] [phone]       Follow up with [facility]. (rolling walker)    Contact information:   [address] [place] [address] [phone]       Follow up with Mauri Pole, MD. Schedule an appointment as soon as possible for a visit in 2 weeks.   Specialty:  Orthopedic Surgery   Contact information:   [address] [place] [address] [phone]       Discharge Instructions   Call MD / Call 911  Complete by:  As directed   If you experience chest pain or shortness of breath, CALL 911 and be transported to the hospital emergency room.  If you develope a fever above 101 F, pus (white drainage) or increased drainage or redness at the wound, or calf pain, call your surgeon's office.     Change dressing    Complete by:  As directed   Maintain surgical dressing for 10-14 days, or until follow up in the clinic.      Constipation Prevention    Complete by:  As directed   Drink plenty of fluids.  Prune juice may be helpful.  You may use a stool softener, such as Colace (over the counter) 100 mg twice a day.  Use MiraLax (over the counter) for constipation as needed.     Diet - low sodium heart healthy    Complete by:  As directed      Discharge instructions    Complete by:  As directed   Maintain surgical dressing for 10-14 days, or until follow up in the clinic. Follow up in 2 weeks at [facility]. Call with any questions or concerns.     Increase activity slowly as tolerated    Complete by:  As directed      TED hose    Complete by:  As directed   Use stockings (TED hose) for 2 weeks on both leg(s).  You may remove them at night for sleeping.     Weight bearing as tolerated    Complete by:  As directed   Laterality:  left  Extremity:  Lower             Medication List         acetaminophen 325 MG tablet  Commonly known as:  TYLENOL  Take 2 tablets (650 mg total) by mouth every 6 (six) hours as needed for mild pain (or Fever >/= 101).     aspirin 325 MG EC tablet  Take 1 tablet (325 mg total) by mouth 2 (two) times daily.     cholecalciferol 1000 UNITS tablet  Commonly known as:  VITAMIN D  Take 1,000 Units by mouth daily.     clorazepate 3.75 MG tablet  Commonly known as:  TRANXENE  Take 1.875-3.375 mg by mouth 3 (three) times daily. Takes 1 tablet in the morning, 0.5 tablet at lunch and 1 in the evening.     DSS 100 MG Caps  Take 100 mg by mouth 2 (two) times daily.     DULoxetine 60 MG capsule  Commonly known as:  CYMBALTA  Take 60 mg by mouth every morning.     HYDROcodone-acetaminophen 7.5-325 MG per tablet  Commonly known as:  NORCO  Take 1 tablet by mouth every 4 (four) hours.     JUICE PLUS FIBRE Liqd  Take 237 mLs by mouth 2 (two) times daily.     losartan 100 MG tablet  Commonly known as:  COZAAR  Take 100 mg by mouth every morning.      metFORMIN 750 MG 24 hr tablet  Commonly known as:  GLUCOPHAGE-XR  Take 1,500 mg by mouth daily with breakfast.     methocarbamol 500 MG tablet  Commonly known as:  ROBAXIN  Take 1 tablet (500 mg total) by mouth every 6 (six) hours as needed for muscle spasms.     polyethylene glycol packet  Commonly known as:  MIRALAX / GLYCOLAX  Take 17 g by mouth 2 (two)  times daily.     polyvinyl alcohol 1.4 % ophthalmic solution  Commonly known as:  LIQUIFILM TEARS  Place 1 drop into both eyes 3 (three) times daily as needed for dry eyes.     pseudoephedrine-acetaminophen 30-500 MG Tabs  Commonly known as:  TYLENOL SINUS  Take 1 tablet by mouth every 4 (four) hours as needed (Sinus).     sitaGLIPtin 100 MG tablet  Commonly known as:  JANUVIA  Take 100 mg by mouth every morning.     SUPER CRANBERRY/VITAMIN D3 4200-500 MG-UNIT Caps  Generic drug:  Cranberry-Cholecalciferol  Take 1 tablet by mouth 2 (two) times daily.     vitamin C 500 MG tablet  Commonly known as:  ASCORBIC ACID  Take 500 mg by mouth 2 (two) times daily.         Signed: West Pugh. Jisel Fleet   PA-C  10/04/2013, 9:37 AM

## 2014-01-21 ENCOUNTER — Emergency Department (HOSPITAL_COMMUNITY): Payer: Medicare Other

## 2014-01-21 ENCOUNTER — Encounter (HOSPITAL_COMMUNITY): Payer: Self-pay | Admitting: Physical Medicine and Rehabilitation

## 2014-01-21 ENCOUNTER — Inpatient Hospital Stay (HOSPITAL_COMMUNITY)
Admission: EM | Admit: 2014-01-21 | Discharge: 2014-01-25 | DRG: 871 | Disposition: A | Discharge status: Home / Self Care | Payer: Medicare Other | Attending: Internal Medicine | Admitting: Internal Medicine

## 2014-01-21 DIAGNOSIS — R55 Syncope and collapse: Secondary | ICD-10-CM | POA: Diagnosis present

## 2014-01-21 DIAGNOSIS — E785 Hyperlipidemia, unspecified: Secondary | ICD-10-CM | POA: Diagnosis present

## 2014-01-21 DIAGNOSIS — E876 Hypokalemia: Secondary | ICD-10-CM | POA: Diagnosis present

## 2014-01-21 DIAGNOSIS — E119 Type 2 diabetes mellitus without complications: Secondary | ICD-10-CM | POA: Diagnosis present

## 2014-01-21 DIAGNOSIS — Z87891 Personal history of nicotine dependence: Secondary | ICD-10-CM | POA: Diagnosis not present

## 2014-01-21 DIAGNOSIS — Z6839 Body mass index (BMI) 39.0-39.9, adult: Secondary | ICD-10-CM | POA: Diagnosis not present

## 2014-01-21 DIAGNOSIS — I2510 Atherosclerotic heart disease of native coronary artery without angina pectoris: Secondary | ICD-10-CM | POA: Diagnosis present

## 2014-01-21 DIAGNOSIS — Z66 Do not resuscitate: Secondary | ICD-10-CM | POA: Diagnosis present

## 2014-01-21 DIAGNOSIS — A419 Sepsis, unspecified organism: Principal | ICD-10-CM | POA: Diagnosis present

## 2014-01-21 DIAGNOSIS — M1990 Unspecified osteoarthritis, unspecified site: Secondary | ICD-10-CM | POA: Diagnosis present

## 2014-01-21 DIAGNOSIS — R109 Unspecified abdominal pain: Secondary | ICD-10-CM | POA: Diagnosis present

## 2014-01-21 DIAGNOSIS — Z8249 Family history of ischemic heart disease and other diseases of the circulatory system: Secondary | ICD-10-CM

## 2014-01-21 DIAGNOSIS — R319 Hematuria, unspecified: Secondary | ICD-10-CM | POA: Diagnosis present

## 2014-01-21 DIAGNOSIS — J189 Pneumonia, unspecified organism: Secondary | ICD-10-CM | POA: Diagnosis present

## 2014-01-21 DIAGNOSIS — R7989 Other specified abnormal findings of blood chemistry: Secondary | ICD-10-CM

## 2014-01-21 DIAGNOSIS — R0602 Shortness of breath: Secondary | ICD-10-CM | POA: Diagnosis present

## 2014-01-21 DIAGNOSIS — I214 Non-ST elevation (NSTEMI) myocardial infarction: Secondary | ICD-10-CM

## 2014-01-21 DIAGNOSIS — R778 Other specified abnormalities of plasma proteins: Secondary | ICD-10-CM | POA: Diagnosis present

## 2014-01-21 DIAGNOSIS — R079 Chest pain, unspecified: Secondary | ICD-10-CM

## 2014-01-21 DIAGNOSIS — N12 Tubulo-interstitial nephritis, not specified as acute or chronic: Secondary | ICD-10-CM | POA: Diagnosis present

## 2014-01-21 DIAGNOSIS — E669 Obesity, unspecified: Secondary | ICD-10-CM | POA: Diagnosis present

## 2014-01-21 DIAGNOSIS — J449 Chronic obstructive pulmonary disease, unspecified: Secondary | ICD-10-CM | POA: Diagnosis present

## 2014-01-21 DIAGNOSIS — I10 Essential (primary) hypertension: Secondary | ICD-10-CM | POA: Diagnosis present

## 2014-01-21 DIAGNOSIS — Z79899 Other long term (current) drug therapy: Secondary | ICD-10-CM | POA: Diagnosis not present

## 2014-01-21 DIAGNOSIS — R059 Cough, unspecified: Secondary | ICD-10-CM

## 2014-01-21 DIAGNOSIS — R062 Wheezing: Secondary | ICD-10-CM

## 2014-01-21 DIAGNOSIS — Z79891 Long term (current) use of opiate analgesic: Secondary | ICD-10-CM

## 2014-01-21 DIAGNOSIS — R05 Cough: Secondary | ICD-10-CM

## 2014-01-21 DIAGNOSIS — E870 Hyperosmolality and hypernatremia: Secondary | ICD-10-CM | POA: Diagnosis present

## 2014-01-21 DIAGNOSIS — R0600 Dyspnea, unspecified: Secondary | ICD-10-CM | POA: Clinically undetermined

## 2014-01-21 DIAGNOSIS — F418 Other specified anxiety disorders: Secondary | ICD-10-CM | POA: Diagnosis present

## 2014-01-21 LAB — COMPREHENSIVE METABOLIC PANEL
ALK PHOS: 119 U/L — AB (ref 39–117)
ALT: 30 U/L (ref 0–35)
AST: 57 U/L — ABNORMAL HIGH (ref 0–37)
Albumin: 3.4 g/dL — ABNORMAL LOW (ref 3.5–5.2)
Anion gap: 15 (ref 5–15)
BUN: 9 mg/dL (ref 6–23)
CALCIUM: 10.1 mg/dL (ref 8.4–10.5)
CO2: 22 mmol/L (ref 19–32)
Chloride: 101 mEq/L (ref 96–112)
Creatinine, Ser: 1.23 mg/dL — ABNORMAL HIGH (ref 0.50–1.10)
GFR calc non Af Amer: 44 mL/min — ABNORMAL LOW (ref >90)
GFR, EST AFRICAN AMERICAN: 51 mL/min — AB (ref >90)
GLUCOSE: 150 mg/dL — AB (ref 70–99)
POTASSIUM: 3.1 mmol/L — AB (ref 3.5–5.1)
Sodium: 138 mmol/L (ref 135–145)
TOTAL PROTEIN: 7.4 g/dL (ref 6.0–8.3)
Total Bilirubin: 0.9 mg/dL (ref 0.3–1.2)

## 2014-01-21 LAB — URINALYSIS, ROUTINE W REFLEX MICROSCOPIC
BILIRUBIN URINE: NEGATIVE
GLUCOSE, UA: NEGATIVE mg/dL
Ketones, ur: NEGATIVE mg/dL
Nitrite: NEGATIVE
PH: 5 (ref 5.0–8.0)
Protein, ur: 30 mg/dL — AB
SPECIFIC GRAVITY, URINE: 1.031 — AB (ref 1.005–1.030)
Urobilinogen, UA: 1 mg/dL (ref 0.0–1.0)

## 2014-01-21 LAB — I-STAT CHEM 8, ED
BUN: 10 mg/dL (ref 6–23)
CALCIUM ION: 1.15 mmol/L (ref 1.13–1.30)
CHLORIDE: 100 meq/L (ref 96–112)
CREATININE: 1.1 mg/dL (ref 0.50–1.10)
Glucose, Bld: 154 mg/dL — ABNORMAL HIGH (ref 70–99)
HCT: 47 % — ABNORMAL HIGH (ref 36.0–46.0)
Hemoglobin: 16 g/dL — ABNORMAL HIGH (ref 12.0–15.0)
Potassium: 3 mmol/L — ABNORMAL LOW (ref 3.5–5.1)
Sodium: 141 mmol/L (ref 135–145)
TCO2: 21 mmol/L (ref 0–100)

## 2014-01-21 LAB — CBC WITH DIFFERENTIAL/PLATELET
Basophils Absolute: 0 10*3/uL (ref 0.0–0.1)
Basophils Relative: 0 % (ref 0–1)
Eosinophils Absolute: 0 10*3/uL (ref 0.0–0.7)
Eosinophils Relative: 0 % (ref 0–5)
HCT: 42.1 % (ref 36.0–46.0)
Hemoglobin: 13.8 g/dL (ref 12.0–15.0)
Lymphocytes Relative: 2 % — ABNORMAL LOW (ref 12–46)
Lymphs Abs: 0.6 10*3/uL — ABNORMAL LOW (ref 0.7–4.0)
MCH: 29.4 pg (ref 26.0–34.0)
MCHC: 32.8 g/dL (ref 30.0–36.0)
MCV: 89.8 fL (ref 78.0–100.0)
Monocytes Absolute: 1.6 10*3/uL — ABNORMAL HIGH (ref 0.1–1.0)
Monocytes Relative: 5 % (ref 3–12)
Neutro Abs: 29.2 10*3/uL — ABNORMAL HIGH (ref 1.7–7.7)
Neutrophils Relative %: 93 % — ABNORMAL HIGH (ref 43–77)
Platelets: 313 10*3/uL (ref 150–400)
RBC: 4.69 MIL/uL (ref 3.87–5.11)
RDW: 13.8 % (ref 11.5–15.5)
WBC Morphology: INCREASED
WBC: 31.4 10*3/uL — ABNORMAL HIGH (ref 4.0–10.5)

## 2014-01-21 LAB — I-STAT TROPONIN, ED: Troponin i, poc: 0.02 ng/mL (ref 0.00–0.08)

## 2014-01-21 LAB — GLUCOSE, CAPILLARY: Glucose-Capillary: 112 mg/dL — ABNORMAL HIGH (ref 70–99)

## 2014-01-21 LAB — TROPONIN I
Troponin I: 0.04 ng/mL — ABNORMAL HIGH (ref <0.031)
Troponin I: 0.1 ng/mL — ABNORMAL HIGH (ref <0.031)

## 2014-01-21 LAB — URINE MICROSCOPIC-ADD ON

## 2014-01-21 LAB — LACTIC ACID, PLASMA: LACTIC ACID, VENOUS: 4.7 mmol/L — AB (ref 0.5–2.2)

## 2014-01-21 LAB — BRAIN NATRIURETIC PEPTIDE: B NATRIURETIC PEPTIDE 5: 106.2 pg/mL — AB (ref 0.0–100.0)

## 2014-01-21 LAB — LIPASE, BLOOD: Lipase: 38 U/L (ref 11–59)

## 2014-01-21 MED ORDER — MAGNESIUM SULFATE 2 GM/50ML IV SOLN
2.0000 g | Freq: Once | INTRAVENOUS | Status: AC
  Administered 2014-01-21: 2 g via INTRAVENOUS
  Filled 2014-01-21: qty 50

## 2014-01-21 MED ORDER — ATORVASTATIN CALCIUM 80 MG PO TABS
80.0000 mg | ORAL_TABLET | Freq: Every day | ORAL | Status: DC
  Filled 2014-01-21 (×5): qty 1

## 2014-01-21 MED ORDER — POTASSIUM CHLORIDE CRYS ER 20 MEQ PO TBCR
40.0000 meq | EXTENDED_RELEASE_TABLET | Freq: Once | ORAL | Status: AC
  Administered 2014-01-21: 40 meq via ORAL
  Filled 2014-01-21: qty 2

## 2014-01-21 MED ORDER — DOCUSATE SODIUM 100 MG PO CAPS
100.0000 mg | ORAL_CAPSULE | Freq: Two times a day (BID) | ORAL | Status: DC
  Administered 2014-01-23 – 2014-01-25 (×4): 100 mg via ORAL
  Filled 2014-01-21 (×9): qty 1

## 2014-01-21 MED ORDER — IPRATROPIUM BROMIDE 0.02 % IN SOLN
0.5000 mg | Freq: Four times a day (QID) | RESPIRATORY_TRACT | Status: DC
  Administered 2014-01-21 – 2014-01-22 (×3): 0.5 mg via RESPIRATORY_TRACT
  Filled 2014-01-21 (×3): qty 2.5

## 2014-01-21 MED ORDER — POTASSIUM CHLORIDE 10 MEQ/100ML IV SOLN
10.0000 meq | INTRAVENOUS | Status: AC
  Administered 2014-01-22 (×2): 10 meq via INTRAVENOUS
  Filled 2014-01-21 (×2): qty 100

## 2014-01-21 MED ORDER — ASPIRIN EC 81 MG PO TBEC
81.0000 mg | DELAYED_RELEASE_TABLET | Freq: Every day | ORAL | Status: DC
  Administered 2014-01-22 – 2014-01-25 (×4): 81 mg via ORAL
  Filled 2014-01-21 (×4): qty 1

## 2014-01-21 MED ORDER — SODIUM CHLORIDE 0.9 % IV BOLUS (SEPSIS)
500.0000 mL | Freq: Once | INTRAVENOUS | Status: AC
  Administered 2014-01-21: 500 mL via INTRAVENOUS

## 2014-01-21 MED ORDER — LOSARTAN POTASSIUM 50 MG PO TABS
100.0000 mg | ORAL_TABLET | Freq: Every morning | ORAL | Status: DC
  Administered 2014-01-22 – 2014-01-25 (×4): 100 mg via ORAL
  Filled 2014-01-21 (×4): qty 2

## 2014-01-21 MED ORDER — ACETAMINOPHEN 325 MG PO TABS: 650.0000 mg | ORAL_TABLET | Freq: Four times a day (QID) | ORAL | Status: DC | PRN

## 2014-01-21 MED ORDER — HYDROCODONE-ACETAMINOPHEN 7.5-325 MG PO TABS
1.0000 | ORAL_TABLET | ORAL | Status: DC
  Administered 2014-01-22 – 2014-01-23 (×3): 1 via ORAL
  Filled 2014-01-21 (×5): qty 1

## 2014-01-21 MED ORDER — CEFTRIAXONE SODIUM IN DEXTROSE 20 MG/ML IV SOLN
1.0000 g | INTRAVENOUS | Status: DC
  Administered 2014-01-21 – 2014-01-23 (×3): 1 g via INTRAVENOUS
  Filled 2014-01-21 (×4): qty 50

## 2014-01-21 MED ORDER — ACETAMINOPHEN 650 MG RE SUPP: 650.0000 mg | Freq: Four times a day (QID) | RECTAL | Status: DC | PRN

## 2014-01-21 MED ORDER — CLORAZEPATE DIPOTASSIUM 3.75 MG PO TABS: 1.8750 mg | ORAL_TABLET | Freq: Three times a day (TID) | ORAL | Status: DC

## 2014-01-21 MED ORDER — CLORAZEPATE DIPOTASSIUM 3.75 MG PO TABS
3.7500 mg | ORAL_TABLET | Freq: Two times a day (BID) | ORAL | Status: DC
  Administered 2014-01-22: 3.75 mg via ORAL
  Filled 2014-01-21 (×2): qty 1

## 2014-01-21 MED ORDER — ONDANSETRON HCL 4 MG PO TABS: 4.0000 mg | ORAL_TABLET | Freq: Four times a day (QID) | ORAL | Status: DC | PRN

## 2014-01-21 MED ORDER — SODIUM CHLORIDE 0.9 % IV SOLN
INTRAVENOUS | Status: AC
  Administered 2014-01-21: 23:00:00 via INTRAVENOUS

## 2014-01-21 MED ORDER — LEVALBUTEROL HCL 0.63 MG/3ML IN NEBU
0.6300 mg | INHALATION_SOLUTION | Freq: Four times a day (QID) | RESPIRATORY_TRACT | Status: DC | PRN
  Administered 2014-01-22 – 2014-01-23 (×4): 0.63 mg via RESPIRATORY_TRACT
  Filled 2014-01-21 (×4): qty 3

## 2014-01-21 MED ORDER — SODIUM CHLORIDE 0.9 % IJ SOLN
3.0000 mL | Freq: Two times a day (BID) | INTRAMUSCULAR | Status: DC
  Administered 2014-01-21 – 2014-01-24 (×5): 3 mL via INTRAVENOUS

## 2014-01-21 MED ORDER — CLORAZEPATE DIPOTASSIUM 3.75 MG PO TABS
1.8750 mg | ORAL_TABLET | Freq: Every day | ORAL | Status: DC
  Administered 2014-01-22: 1.875 mg via ORAL
  Filled 2014-01-21: qty 1

## 2014-01-21 MED ORDER — ONDANSETRON HCL 4 MG/2ML IJ SOLN: 4.0000 mg | Freq: Four times a day (QID) | INTRAMUSCULAR | Status: DC | PRN

## 2014-01-21 MED ORDER — ASPIRIN 81 MG PO CHEW
324.0000 mg | CHEWABLE_TABLET | Freq: Once | ORAL | Status: AC
  Administered 2014-01-21: 324 mg via ORAL
  Filled 2014-01-21: qty 4

## 2014-01-21 MED ORDER — DULOXETINE HCL 60 MG PO CPEP
60.0000 mg | ORAL_CAPSULE | Freq: Every morning | ORAL | Status: DC
  Administered 2014-01-22 – 2014-01-25 (×4): 60 mg via ORAL
  Filled 2014-01-21 (×4): qty 1

## 2014-01-21 MED ORDER — ENOXAPARIN SODIUM 30 MG/0.3ML ~~LOC~~ SOLN
30.0000 mg | Freq: Every day | SUBCUTANEOUS | Status: DC
  Administered 2014-01-21: 30 mg via SUBCUTANEOUS
  Filled 2014-01-21 (×2): qty 0.3

## 2014-01-21 MED ORDER — SODIUM CHLORIDE 0.9 % IV BOLUS (SEPSIS)
1000.0000 mL | Freq: Once | INTRAVENOUS | Status: AC
  Administered 2014-01-21: 1000 mL via INTRAVENOUS

## 2014-01-21 MED ORDER — IOHEXOL 350 MG/ML SOLN
100.0000 mL | Freq: Once | INTRAVENOUS | Status: AC | PRN
  Administered 2014-01-21: 100 mL via INTRAVENOUS

## 2014-01-21 MED ORDER — METHOCARBAMOL 500 MG PO TABS
500.0000 mg | ORAL_TABLET | Freq: Four times a day (QID) | ORAL | Status: DC | PRN
  Filled 2014-01-21: qty 1

## 2014-01-21 MED ORDER — DEXTROSE 5 % IV SOLN
1.0000 g | Freq: Once | INTRAVENOUS | Status: AC
  Administered 2014-01-21: 1 g via INTRAVENOUS
  Filled 2014-01-21: qty 10

## 2014-01-21 MED ORDER — PANTOPRAZOLE SODIUM 40 MG PO TBEC
40.0000 mg | DELAYED_RELEASE_TABLET | Freq: Every day | ORAL | Status: DC
  Administered 2014-01-22 – 2014-01-25 (×4): 40 mg via ORAL
  Filled 2014-01-21 (×3): qty 1

## 2014-01-21 MED ORDER — POLYETHYLENE GLYCOL 3350 17 G PO PACK
17.0000 g | PACK | Freq: Two times a day (BID) | ORAL | Status: DC
  Administered 2014-01-23 – 2014-01-25 (×2): 17 g via ORAL
  Filled 2014-01-21 (×9): qty 1

## 2014-01-21 MED ORDER — INSULIN ASPART 100 UNIT/ML ~~LOC~~ SOLN
0.0000 [IU] | SUBCUTANEOUS | Status: DC
  Administered 2014-01-22 – 2014-01-23 (×5): 1 [IU] via SUBCUTANEOUS

## 2014-01-21 NOTE — ED Provider Notes (Signed)
CSN: [id number]     Arrival date & time 01/21/14  1429 History   First MD Initiated Contact with Patient 01/21/14 1502     Chief Complaint  Patient presents with  . Near Syncope  . Weakness     (Consider location/radiation/quality/duration/timing/severity/associated sxs/prior Treatment) Patient is a 68 y.o. female presenting with chest pain.  Chest Pain Pain location:  L chest and substernal area Pain quality: pressure and sharp   Pain radiates to:  Does not radiate Pain radiates to the back: no   Pain severity:  Severe Onset quality:  Sudden Timing:  Constant Progression:  Resolved Chronicity:  New Context: breathing and at rest   Relieved by: pain medications helped initially. Worsened by:  Deep breathing Ineffective treatments:  None tried Associated symptoms: diaphoresis, fatigue, nausea, shortness of breath and vomiting (one episode)   Associated symptoms: no abdominal pain (reported in triage but denies on my evaluation), no back pain, no cough, no fever, no headache, no numbness, no palpitations and no syncope   Risk factors: diabetes mellitus, hypertension, immobilization (has been less mobile since knee surgery) and surgery (september)   Risk factors: no coronary artery disease and no prior DVT/PE     Past Medical History  Diagnosis Date  . Hypertension   . Exogenous obesity   . Arthritis   . Anxiety   . Depression   . Diabetes mellitus without complication   . Chronic UTI (urinary tract infection)   . Osteonecrosis   . Shortness of breath     with exertion    Past Surgical History  Procedure Laterality Date  . Total knee arthroplasty    . Tubal ligation    . Cataract surgery     . Finger surgery      related to tumors on finger removal   . Total knee arthroplasty Left 09/26/2013    Procedure: LEFT TOTAL KNEE ARTHROPLASTY;  Surgeon: Mauri Pole, MD;  Location: [facility]S;  Service: Orthopedics;  Laterality: Left;   Family History  Problem Relation Age of  Onset  . Emphysema Father    History  Substance Use Topics  . Smoking status: Former Smoker    Types: Cigarettes    Quit date: 06/23/2010  . Smokeless tobacco: Never Used     Comment: E-Cigarette  . Alcohol Use: No   OB History    Gravida Para Term Preterm AB TAB SAB Ectopic Multiple Living   0              Review of Systems  Constitutional: Positive for diaphoresis, activity change and fatigue. Negative for fever.  HENT: Negative for sore throat.   Eyes: Negative for visual disturbance.  Respiratory: Positive for shortness of breath. Negative for cough.   Cardiovascular: Positive for chest pain. Negative for palpitations, leg swelling and syncope.  Gastrointestinal: Positive for nausea and vomiting (one episode). Negative for abdominal pain (reported in triage but denies on my evaluation), diarrhea and constipation.  Genitourinary: Negative for dysuria (reports urinary symptoms last week that resolved) and difficulty urinating.  Musculoskeletal: Negative for back pain and neck pain.  Skin: Negative for rash.  Neurological: Positive for light-headedness. Negative for syncope, numbness and headaches.      Allergies  Ciprofloxacin; Codeine; and Prednisone  Home Medications   Prior to Admission medications   Medication Sig Start Date End Date Taking? Authorizing Provider  acetaminophen (TYLENOL) 325 MG tablet Take 2 tablets (650 mg total) by mouth every 6 (six) hours as needed  for mild pain (or Fever >/= 101). 09/27/13  Yes Lucille Passy Babish, PA-C  cholecalciferol (VITAMIN D) 1000 UNITS tablet Take 1,000 Units by mouth daily.   Yes Historical Provider, MD  clorazepate (TRANXENE) 3.75 MG tablet Take 1.875-3.375 mg by mouth 3 (three) times daily. Takes 1 tablet in the morning, 0.5 tablet at lunch and 1 in the evening.   Yes Historical Provider, MD  Cranberry-Cholecalciferol (SUPER CRANBERRY/VITAMIN D3) 4200-500 MG-UNIT CAPS Take 1 tablet by mouth 2 (two) times daily.   Yes  Historical Provider, MD  docusate sodium 100 MG CAPS Take 100 mg by mouth 2 (two) times daily. 09/27/13  Yes Lucille Passy Babish, PA-C  DULoxetine (CYMBALTA) 60 MG capsule Take 60 mg by mouth every morning.   Yes Historical Provider, MD  HYDROcodone-acetaminophen (NORCO) 7.5-325 MG per tablet Take 1 tablet by mouth every 4 (four) hours. 09/27/13  Yes Lucille Passy Babish, PA-C  losartan (COZAAR) 100 MG tablet Take 100 mg by mouth every morning.   Yes Historical Provider, MD  metFORMIN (GLUCOPHAGE-XR) 750 MG 24 hr tablet Take 1,500 mg by mouth daily with breakfast.   Yes Historical Provider, MD  methocarbamol (ROBAXIN) 500 MG tablet Take 1 tablet (500 mg total) by mouth every 6 (six) hours as needed for muscle spasms. 09/27/13  Yes Lucille Passy Babish, PA-C  polyethylene glycol (MIRALAX / GLYCOLAX) packet Take 17 g by mouth 2 (two) times daily. 09/27/13  Yes Lucille Passy Babish, PA-C  polyvinyl alcohol (LIQUIFILM TEARS) 1.4 % ophthalmic solution Place 1 drop into both eyes 3 (three) times daily as needed for dry eyes.   Yes Historical Provider, MD  pseudoephedrine-acetaminophen (TYLENOL SINUS) 30-500 MG TABS Take 1 tablet by mouth every 4 (four) hours as needed (Sinus).   Yes Historical Provider, MD  vitamin C (ASCORBIC ACID) 500 MG tablet Take 500 mg by mouth 2 (two) times daily.   Yes Historical Provider, MD   BP 132/58 mmHg  Pulse 116  Temp(Src) 98.1 F (36.7 C) (Oral)  Resp 29  Ht 5\' 3"  (1.6 m)  Wt 224 lb 4.8 oz (101.742 kg)  BMI 39.74 kg/m2  SpO2 98%  LMP 06/23/1995 Physical Exam  Constitutional: She is oriented to person, place, and time. She appears well-developed and well-nourished. No distress.  HENT:  Head: Normocephalic and atraumatic.  Eyes: Conjunctivae and EOM are normal.  Neck: Normal range of motion.  Cardiovascular: Regular rhythm, normal heart sounds and intact distal pulses.  Tachycardia present.  Exam reveals no gallop and no friction rub.   No murmur  heard. Pulmonary/Chest: Effort normal and breath sounds normal. No respiratory distress. She has no wheezes. She has no rales.  Abdominal: Soft. She exhibits no distension. There is no tenderness. There is no guarding.  Musculoskeletal: She exhibits no edema or tenderness.  Right knee incision, well healed no surrounding erythema  Neurological: She is alert and oriented to person, place, and time.  Skin: Skin is warm and dry. No rash noted. She is not diaphoretic. No erythema.  Nursing note and vitals reviewed.   ED Course  Procedures (including critical care time) Labs Review Labs Reviewed  COMPREHENSIVE METABOLIC PANEL - Abnormal; Notable for the following:    Potassium 3.1 (*)    Glucose, Bld 150 (*)    Creatinine, Ser 1.23 (*)    Albumin 3.4 (*)    AST 57 (*)    Alkaline Phosphatase 119 (*)    GFR calc non Af Amer 44 (*)    GFR  calc Af Amer 51 (*)    All other components within normal limits  CBC WITH DIFFERENTIAL - Abnormal; Notable for the following:    WBC 31.4 (*)    Neutrophils Relative % 93 (*)    Lymphocytes Relative 2 (*)    Neutro Abs 29.2 (*)    Lymphs Abs 0.6 (*)    Monocytes Absolute 1.6 (*)    All other components within normal limits  MAGNESIUM - Abnormal; Notable for the following:    Magnesium 0.9 (*)    All other components within normal limits  BRAIN NATRIURETIC PEPTIDE - Abnormal; Notable for the following:    B Natriuretic Peptide 106.2 (*)    All other components within normal limits  TROPONIN I - Abnormal; Notable for the following:    Troponin I 0.04 (*)    All other components within normal limits  URINALYSIS, ROUTINE W REFLEX MICROSCOPIC - Abnormal; Notable for the following:    Color, Urine AMBER (*)    APPearance TURBID (*)    Specific Gravity, Urine 1.031 (*)    Hgb urine dipstick LARGE (*)    Protein, ur 30 (*)    Leukocytes, UA LARGE (*)    All other components within normal limits  URINE MICROSCOPIC-ADD ON - Abnormal; Notable for the  following:    Squamous Epithelial / LPF FEW (*)    Bacteria, UA MANY (*)    All other components within normal limits  TROPONIN I - Abnormal; Notable for the following:    Troponin I 0.10 (*)    All other components within normal limits  LACTIC ACID, PLASMA - Abnormal; Notable for the following:    Lactic Acid, Venous 4.7 (*)    All other components within normal limits  GLUCOSE, CAPILLARY - Abnormal; Notable for the following:    Glucose-Capillary 112 (*)    All other components within normal limits  I-STAT CHEM 8, ED - Abnormal; Notable for the following:    Potassium 3.0 (*)    Glucose, Bld 154 (*)    Hemoglobin 16.0 (*)    HCT 47.0 (*)    All other components within normal limits  URINE CULTURE  CULTURE, BLOOD (ROUTINE X 2)  CULTURE, BLOOD (ROUTINE X 2)  LIPASE, BLOOD  PROCALCITONIN  HEMOGLOBIN A1C  MAGNESIUM  PHOSPHORUS  TSH  COMPREHENSIVE METABOLIC PANEL  CBC  CBC  CREATININE, SERUM  TROPONIN I  TROPONIN I  TROPONIN I  I-STAT TROPOININ, ED    Imaging Review Ct Angio Chest Pe W/cm &/or Wo Cm  01/21/2014   CLINICAL DATA:  Chest pain, chronic shortness of breath with worsening in last 2 days, history of COPD  EXAM: CT ANGIOGRAPHY CHEST WITH CONTRAST  TECHNIQUE: Multidetector CT imaging of the chest was performed using the standard protocol during bolus administration of intravenous contrast. Multiplanar CT image reconstructions and MIPs were obtained to evaluate the vascular anatomy.  CONTRAST:  156mL OMNIPAQUE IOHEXOL 350 MG/ML SOLN  COMPARISON:  None.  FINDINGS: There are streaky artifacts from patient's large body habitus. Degenerative changes are noted right shoulder.  Sagittal images of the spine shows multilevel degenerative changes thoracic spine. Sagittal view of the sternum is unremarkable.  Images of the thoracic inlet are unremarkable. Central airways are patent. Atherosclerotic calcifications of thoracic aorta are noted. Mild atherosclerotic calcifications of  coronary arteries. No aortic aneurysm. No hilar or mediastinal adenopathy.  The study is of excellent technical quality. No pulmonary embolus is noted.  Images of the lung  parenchyma shows no pulmonary edema. No segmental infiltrate. Mild atelectasis and minimal pleural thickening noted bilateral lower lobe posteriorly. No rib fractures are noted.  There is mild hepatic fatty infiltration.  The visualized upper abdomen shows global decreased enhancement upper pole of the left kidney with loss of cortico medullary differentiation. Diffuse inflammatory process mass or infection cannot be excluded. Clinical correlation is necessary. Follow-up nonemergent enhanced study is recommended for further correlation.  Review of the MIP images confirms the above findings.  IMPRESSION: 1. No pulmonary embolus is noted. 2. No aortic aneurysm. No acute infiltrate or pulmonary edema. Mild dependent atelectasis and pleural thickening lung bases posteriorly. 3. Atherosclerotic calcifications of thoracic aorta and mild coronary artery calcifications. 4. Degenerative changes thoracic spine. 5. Abnormal decreased enhancement with ill-defined contour and loss of normal cortical medullary differentiation upper pole of the left kidney. Inflammatory process, mass or infection cannot be excluded. Clinical correlation is necessary. Follow-up enhanced study is recommended as clinically warranted.   Electronically Signed   By: Lahoma Crocker M.D.   On: 01/21/2014 17:41   Dg Chest Portable 1 View  01/21/2014   CLINICAL DATA:  Left-sided chest pain.  EXAM: PORTABLE CHEST - 1 VIEW  COMPARISON:  PA and lateral chest 09/19/2013.  FINDINGS: Lung volumes are low but the lungs are clear. Heart size is normal. No pneumothorax or pleural effusion. Remote surgical neck fracture right humerus is noted.  IMPRESSION: No acute disease.   Electronically Signed   By: Inge Rise M.D.   On: 01/21/2014 15:56     EKG Interpretation None      MDM    Final diagnoses:  Shortness of breath  Chest pain, unspecified chest pain type  Pyelonephritis  Sepsis, due to unspecified organism   68 year old female with a history of hypertension, diabetes, recent knee surgery in September presents with concern of sudden onset of chest pain, shortness of breath, diaphoresis and lightheadedness.  Patient is tachycardic to the 120s on arrival to the emergency department hypoxic to 89% on room air.  Given risk factors of relative immobilization following knee surgery, family history of thromboembolism, tachycardia and hypoxia we'll perform a CTA of the chest to evaluate for PE.  EKG shows a normal sinus tachycardia without other acute ST changes. Ordered CBC, CMP, troponin, BNP which were significant for mild acute kidney injury with a creatinine of 1.23 from prior of 0.9, leukocytosis of 31.4, hypomagnesemia of 0.9, hypokalemia of 3.1, elevated troponin 0.04.  CT PE study showed no signs of PE however showed signs concerning for a left pyelonephritis. Urinalysis was done which confirmed urinary tract infection. Patient with likely sepsis contributed to sinus tachycardia caused by pyelonephritis.  Unclear if patient's chest pain is a radiation from her flank, or if she is experiencing demand ischemia in the setting of tachycardia. She was given aspirin. A repeat troponin was 0.1. She was given Rocephin, potassium, IVF and magnesium. HR improved with IVF. Discussed all results with patient in detail.  She was admitted to hospitalist telemetry in stable condition.   Alvino Chapel, MD 01/22/14 [id number]  Alvino Chapel, MD 01/22/14 0630  Ezequiel Essex, MD 01/22/14 1601

## 2014-01-21 NOTE — ED Notes (Addendum)
Pt presents to department for evaluation of near syncope. States she was walking to bathroom when knees became weak and she was assisted to ground. EMS reports tachycardia, rate of 150 en route to hospital. Husbands states she lost job x5 months ago, pt very stressed about this. Pt diaphoretic upon arrival, pt also states R sided abdominal pain this morning. Pt is alert and oriented x4. 20g R wrist. CBG 150.

## 2014-01-21 NOTE — H&P (Signed)
PCP:  Thressa Sheller, [facility]   Chief Complaint:  Chest pain  HPI: Shannon Lynch is a 68 y.o. female   has a past medical history of Hypertension; Exogenous obesity; Arthritis; Anxiety; Depression; Diabetes mellitus without complication; Chronic UTI (urinary tract infection); Osteonecrosis; and Shortness of breath.   Presented with  Patient started to have sudden onset of stubbing chest pain with radiation to the left side of the chest and back at 2 AM today. This was associated with diaphoresis. She attributed it to gas from chilli that she ate. Patient was trying to get up and started to fall due to lightheadedness. Family prevented the  She had generalized weakness but denies any localized symptoms.  They report that patient was confused.  She arrived to ER diaphoretic and short of breath she was noted to be tachycardic. Initially she was evaluated for PE with CTA chest which showed not PE but evidence of pyelonephritis. She does endorse some urinary burning and some hematuria. UA showed evidence of UTI. She denies any fever. Patient is currently chest pain free. She denies hx of CAD. She reports never having a stress test. In emergency department initial troponin was found to be 0.04 for repeat troponin was 0.10 ECG is not evidence of acute ischemia patient was discussed with cardiology who will see in consult  Hospitalist was called for admission for Sepsis due to pyelonephritis and elevated troponin vs NSTEMI  Review of Systems:    Pertinent positives include: chest pain, abdominal pain, dizziness,  indigestion, shortness of breath at rest.  nausea, vomiting, Constitutional:  No weight loss, night sweats, Fevers, chills, fatigue, weight loss  HEENT:  No headaches, Difficulty swallowing,Tooth/dental problems,Sore throat,  No sneezing, itching, ear ache, nasal congestion, post nasal drip,  Cardio-vascular:  No  Orthopnea, PND, anasarca,  palpitations.no  Bilateral lower extremity swelling  GI:  No heartburn, diarrhea, change in bowel habits, loss of appetite, melena, blood in stool, hematemesis Resp:  no  No dyspnea on exertion, No excess mucus, no productive cough, No non-productive cough, No coughing up of blood.No change in color of mucus.No wheezing. Skin:  no rash or lesions. No jaundice GU:  no dysuria, change in color of urine, no urgency or frequency. No straining to urinate.  No flank pain.  Musculoskeletal:  No joint pain or no joint swelling. No decreased range of motion. No back pain.  Psych:  No change in mood or affect. No depression or anxiety. No memory loss.  Neuro: no localizing neurological complaints, no tingling, no weakness, no double vision, no gait abnormality, no slurred speech, no confusion  Otherwise ROS are negative except for above, 10 systems were reviewed  Past Medical History: Past Medical History  Diagnosis Date  . Hypertension   . Exogenous obesity   . Arthritis   . Anxiety   . Depression   . Diabetes mellitus without complication   . Chronic UTI (urinary tract infection)   . Osteonecrosis   . Shortness of breath     with exertion    Past Surgical History  Procedure Laterality Date  . Total knee arthroplasty    . Tubal ligation    . Cataract surgery     . Finger surgery      related to tumors on finger removal   . Total knee arthroplasty Left 09/26/2013    Procedure: LEFT TOTAL KNEE ARTHROPLASTY;  Surgeon: Mauri Pole, MD;  Location: [facility];  Service: Orthopedics;  Laterality: Left;  Medications: Prior to Admission medications   Medication Sig Start Date End Date Taking? Authorizing Provider  acetaminophen (TYLENOL) 325 MG tablet Take 2 tablets (650 mg total) by mouth every 6 (six) hours as needed for mild pain (or Fever >/= 101). 09/27/13  Yes Lucille Passy Babish, PA-C  cholecalciferol (VITAMIN D) 1000 UNITS tablet Take 1,000 Units by mouth daily.   Yes Historical Provider, MD    clorazepate (TRANXENE) 3.75 MG tablet Take 1.875-3.375 mg by mouth 3 (three) times daily. Takes 1 tablet in the morning, 0.5 tablet at lunch and 1 in the evening.   Yes Historical Provider, MD  Cranberry-Cholecalciferol (SUPER CRANBERRY/VITAMIN D3) 4200-500 MG-UNIT CAPS Take 1 tablet by mouth 2 (two) times daily.   Yes Historical Provider, MD  docusate sodium 100 MG CAPS Take 100 mg by mouth 2 (two) times daily. 09/27/13  Yes Lucille Passy Babish, PA-C  DULoxetine (CYMBALTA) 60 MG capsule Take 60 mg by mouth every morning.   Yes Historical Provider, MD  HYDROcodone-acetaminophen (NORCO) 7.5-325 MG per tablet Take 1 tablet by mouth every 4 (four) hours. 09/27/13  Yes Lucille Passy Babish, PA-C  losartan (COZAAR) 100 MG tablet Take 100 mg by mouth every morning.   Yes Historical Provider, MD  metFORMIN (GLUCOPHAGE-XR) 750 MG 24 hr tablet Take 1,500 mg by mouth daily with breakfast.   Yes Historical Provider, MD  methocarbamol (ROBAXIN) 500 MG tablet Take 1 tablet (500 mg total) by mouth every 6 (six) hours as needed for muscle spasms. 09/27/13  Yes Lucille Passy Babish, PA-C  polyethylene glycol (MIRALAX / GLYCOLAX) packet Take 17 g by mouth 2 (two) times daily. 09/27/13  Yes Lucille Passy Babish, PA-C  polyvinyl alcohol (LIQUIFILM TEARS) 1.4 % ophthalmic solution Place 1 drop into both eyes 3 (three) times daily as needed for dry eyes.   Yes Historical Provider, MD  pseudoephedrine-acetaminophen (TYLENOL SINUS) 30-500 MG TABS Take 1 tablet by mouth every 4 (four) hours as needed (Sinus).   Yes Historical Provider, MD  vitamin C (ASCORBIC ACID) 500 MG tablet Take 500 mg by mouth 2 (two) times daily.   Yes Historical Provider, MD    Allergies:   Allergies  Allergen Reactions  . Ciprofloxacin Hives  . Codeine Nausea And Vomiting  . Prednisone     Elevated glucose level to 700    Social History:  Ambulatory  independently  Lives at home  With family     reports that she quit smoking about 3 years  ago. Her smoking use included Cigarettes. She smoked 0.00 packs per day. She has never used smokeless tobacco. She reports that she does not drink alcohol or use illicit drugs.    Family History: family history includes Emphysema in her father.    Physical Exam: Patient Vitals for the past 24 hrs:  BP Temp Temp src Pulse Resp SpO2  01/21/14 1946 (!) 119/52 mmHg - - 105 26 94 %  01/21/14 1744 129/56 mmHg - - 113 20 95 %  01/21/14 1626 (!) 116/43 mmHg - - 105 20 99 %  01/21/14 1444 123/59 mmHg 99.6 F (37.6 C) Oral - 24 92 %    1. General:  in No Acute distress 2. Psychological: Alert and   Oriented 3. Head/ENT:     Dry Mucous Membranes                          Head Non traumatic, neck supple  Normal  Dentition 4. SKIN:   decreased Skin turgor,  Skin clean Dry and intact no rash 5. Heart: Regular rate and rhythm no Murmur, Rub or gallop 6. Lungs: Clear to auscultation bilaterally, no wheezes or crackles   7. Abdomen: Soft, non-tender, Non distended 8. Lower extremities: no clubbing, cyanosis, or edema 9. Neurologically Grossly intact, moving all 4 extremities equally 10. MSK: Normal range of motion, costovertebral tenderness  body mass index is unknown because there is no weight on file.   Labs on Admission:   Results for orders placed or performed during the hospital encounter of 01/21/14 (from the past 24 hour(s))  Comprehensive metabolic panel     Status: Abnormal   Collection Time: 01/21/14  3:33 PM  Result Value Ref Range   Sodium 138 135 - 145 mmol/L   Potassium 3.1 (L) 3.5 - 5.1 mmol/L   Chloride 101 96 - 112 mEq/L   CO2 22 19 - 32 mmol/L   Glucose, Bld 150 (H) 70 - 99 mg/dL   BUN 9 6 - 23 mg/dL   Creatinine, Ser 1.23 (H) 0.50 - 1.10 mg/dL   Calcium 10.1 8.4 - 10.5 mg/dL   Total Protein 7.4 6.0 - 8.3 g/dL   Albumin 3.4 (L) 3.5 - 5.2 g/dL   AST 57 (H) 0 - 37 U/L   ALT 30 0 - 35 U/L   Alkaline Phosphatase 119 (H) 39 - 117 U/L   Total  Bilirubin 0.9 0.3 - 1.2 mg/dL   GFR calc non Af Amer 44 (L) >90 mL/min   GFR calc Af Amer 51 (L) >90 mL/min   Anion gap 15 5 - 15  CBC with Differential     Status: Abnormal   Collection Time: 01/21/14  3:33 PM  Result Value Ref Range   WBC 31.4 (H) 4.0 - 10.5 K/uL   RBC 4.69 3.87 - 5.11 MIL/uL   Hemoglobin 13.8 12.0 - 15.0 g/dL   HCT 42.1 36.0 - 46.0 %   MCV 89.8 78.0 - 100.0 fL   MCH 29.4 26.0 - 34.0 pg   MCHC 32.8 30.0 - 36.0 g/dL   RDW 13.8 11.5 - 15.5 %   Platelets 313 150 - 400 K/uL   Neutrophils Relative % 93 (H) 43 - 77 %   Lymphocytes Relative 2 (L) 12 - 46 %   Monocytes Relative 5 3 - 12 %   Eosinophils Relative 0 0 - 5 %   Basophils Relative 0 0 - 1 %   Neutro Abs 29.2 (H) 1.7 - 7.7 K/uL   Lymphs Abs 0.6 (L) 0.7 - 4.0 K/uL   Monocytes Absolute 1.6 (H) 0.1 - 1.0 K/uL   Eosinophils Absolute 0.0 0.0 - 0.7 K/uL   Basophils Absolute 0.0 0.0 - 0.1 K/uL   WBC Morphology INCREASED BANDS (>20% BANDS)   Magnesium     Status: Abnormal   Collection Time: 01/21/14  3:33 PM  Result Value Ref Range   Magnesium 0.9 (LL) 1.5 - 2.5 mg/dL  Brain natriuretic peptide (order if patient c/o SOB ONLY)     Status: Abnormal   Collection Time: 01/21/14  3:33 PM  Result Value Ref Range   B Natriuretic Peptide 106.2 (H) 0.0 - 100.0 pg/mL  Troponin I (order at [facility])     Status: Abnormal   Collection Time: 01/21/14  3:33 PM  Result Value Ref Range   Troponin I 0.04 (H) <0.031 ng/mL  Lipase, blood     Status: None  Collection Time: 01/21/14  3:33 PM  Result Value Ref Range   Lipase 38 11 - 59 U/L  I-stat troponin, ED (not at [facility])     Status: None   Collection Time: 01/21/14  3:49 PM  Result Value Ref Range   Troponin i, poc 0.02 0.00 - 0.08 ng/mL   Comment 3          I-Stat Chem 8, ED     Status: Abnormal   Collection Time: 01/21/14  3:51 PM  Result Value Ref Range   Sodium 141 135 - 145 mmol/L   Potassium 3.0 (L) 3.5 - 5.1 mmol/L   Chloride 100 96 - 112 mEq/L   BUN 10 6 - 23 mg/dL     Creatinine, Ser 1.10 0.50 - 1.10 mg/dL   Glucose, Bld 154 (H) 70 - 99 mg/dL   Calcium, Ion 1.15 1.13 - 1.30 mmol/L   TCO2 21 0 - 100 mmol/L   Hemoglobin 16.0 (H) 12.0 - 15.0 g/dL   HCT 47.0 (H) 36.0 - 46.0 %  Urinalysis, Routine w reflex microscopic     Status: Abnormal   Collection Time: 01/21/14  6:27 PM  Result Value Ref Range   Color, Urine AMBER (A) YELLOW   APPearance TURBID (A) CLEAR   Specific Gravity, Urine 1.031 (H) 1.005 - 1.030   pH 5.0 5.0 - 8.0   Glucose, UA NEGATIVE NEGATIVE mg/dL   Hgb urine dipstick LARGE (A) NEGATIVE   Bilirubin Urine NEGATIVE NEGATIVE   Ketones, ur NEGATIVE NEGATIVE mg/dL   Protein, ur 30 (A) NEGATIVE mg/dL   Urobilinogen, UA 1.0 0.0 - 1.0 mg/dL   Nitrite NEGATIVE NEGATIVE   Leukocytes, UA LARGE (A) NEGATIVE  Urine microscopic-add on     Status: Abnormal   Collection Time: 01/21/14  6:27 PM  Result Value Ref Range   Squamous Epithelial / LPF FEW (A) RARE   WBC, UA TOO NUMEROUS TO COUNT <3 WBC/hpf   RBC / HPF 3-6 <3 RBC/hpf   Bacteria, UA MANY (A) RARE    UA evidence of UTI  Lab Results  Component Value Date   HGBA1C 6.9* 09/26/2013    CrCl cannot be calculated (Unknown ideal weight.).  BNP (last 3 results) No results for input(s): PROBNP in the last 8760 hours.  Other results:  I have pearsonaly reviewed this: ECG REPORT  Rate:124  Rhythm: ST ST&T Change: no acute ischemic changes   There were no vitals filed for this visit.   Cultures:    Component Value Date/Time   SDES URINE, RANDOM 02/08/2011 1121   SPECREQUEST none Normal 02/08/2011 1121   CULT  02/08/2011 1121    Multiple bacterial morphotypes present, none predominant. Suggest appropriate recollection if clinically indicated.   REPTSTATUS 02/09/2011 FINAL 02/08/2011 1121     Radiological Exams on Admission: Ct Angio Chest Pe W/cm &/or Wo Cm  01/21/2014   CLINICAL DATA:  Chest pain, chronic shortness of breath with worsening in last 2 days, history of COPD   EXAM: CT ANGIOGRAPHY CHEST WITH CONTRAST  TECHNIQUE: Multidetector CT imaging of the chest was performed using the standard protocol during bolus administration of intravenous contrast. Multiplanar CT image reconstructions and MIPs were obtained to evaluate the vascular anatomy.  CONTRAST:  122mL OMNIPAQUE IOHEXOL 350 MG/ML SOLN  COMPARISON:  None.  FINDINGS: There are streaky artifacts from patient's large body habitus. Degenerative changes are noted right shoulder.  Sagittal images of the spine shows multilevel degenerative changes thoracic spine. Sagittal view of  the sternum is unremarkable.  Images of the thoracic inlet are unremarkable. Central airways are patent. Atherosclerotic calcifications of thoracic aorta are noted. Mild atherosclerotic calcifications of coronary arteries. No aortic aneurysm. No hilar or mediastinal adenopathy.  The study is of excellent technical quality. No pulmonary embolus is noted.  Images of the lung parenchyma shows no pulmonary edema. No segmental infiltrate. Mild atelectasis and minimal pleural thickening noted bilateral lower lobe posteriorly. No rib fractures are noted.  There is mild hepatic fatty infiltration.  The visualized upper abdomen shows global decreased enhancement upper pole of the left kidney with loss of cortico medullary differentiation. Diffuse inflammatory process mass or infection cannot be excluded. Clinical correlation is necessary. Follow-up nonemergent enhanced study is recommended for further correlation.  Review of the MIP images confirms the above findings.  IMPRESSION: 1. No pulmonary embolus is noted. 2. No aortic aneurysm. No acute infiltrate or pulmonary edema. Mild dependent atelectasis and pleural thickening lung bases posteriorly. 3. Atherosclerotic calcifications of thoracic aorta and mild coronary artery calcifications. 4. Degenerative changes thoracic spine. 5. Abnormal decreased enhancement with ill-defined contour and loss of normal  cortical medullary differentiation upper pole of the left kidney. Inflammatory process, mass or infection cannot be excluded. Clinical correlation is necessary. Follow-up enhanced study is recommended as clinically warranted.   Electronically Signed   By: Lahoma Crocker M.D.   On: 01/21/2014 17:41   Dg Chest Portable 1 View  01/21/2014   CLINICAL DATA:  Left-sided chest pain.  EXAM: PORTABLE CHEST - 1 VIEW  COMPARISON:  PA and lateral chest 09/19/2013.  FINDINGS: Lung volumes are low but the lungs are clear. Heart size is normal. No pneumothorax or pleural effusion. Remote surgical neck fracture right humerus is noted.  IMPRESSION: No acute disease.   Electronically Signed   By: Inge Rise M.D.   On: 01/21/2014 15:56    Chart has been reviewed  Assessment/Plan  68 year old female with history of hypertension and diabetes presents of acute onset of chest pain and lightheadedness with presyncope was found to have no evidence of PE but evidence of slightly elevated troponin was found to have evidence of UTI and pyelonephritis  Present on Admission:  . Chest pain - in a setting of risk factors and elevated troponin discuss patient with cardiology. At this point the recommendation is to hold off on heparinization but continue to cycle cardiac enzymes and will see patient in consultation. We'll make him nothing by mouth in case she'll need a catheterization or stress test in near future.  . Pyelonephritis - treated Rocephin await results of urine and blood culture  . Sepsis - await results of blood cultures, obtain protocol serotonin. Lactic acid. Give IV fluids and IV antibiotics currently is hemodynamically stable  . Essential hypertension - currently hemodynamically stable continue to monitor  . Hypokalemia - will replace will also replace magnesium and monitor  . Type 2 diabetes mellitus with hyperosmolar nonketotic hyperglycemia - sensitive sliding scale  . Elevated troponin - in the setting of  chest pain will admit to telemetry, continue to cycle cardiac enzymes monitor on serial EKGs. Cardiology consult called. Nothing by mouth for possible studies in the morning   Prophylaxis:  Lovenox, Protonix  CODE STATUS: DNR/DNI as per pateint  Other plan as per orders.  I have spent a total of 65 min on this admission time taken to discuss patient's care with cardiology Dr. Gillis Ends 01/21/2014, 8:11 PM  [facility]  Pager [phone]  after 2 AM please page floor coverage PA If 7AM-7PM, please contact the day team taking care of the patient  [email or web address]  Password [email or web address]

## 2014-01-21 NOTE — ED Notes (Signed)
Pt SOB and diaphoretic upon arrival, lung sounds diminished bilateral bases. Pt denies pain at the time. Emotional about losing job several months ago, husband states she has been very stressed. Pt speaking complete sentences. No neurological deficits noted. Able to move all extremities.

## 2014-01-21 NOTE — Consult Note (Addendum)
Reason for Consult: chest pain, elevated troponin Referring Physician: Dr. Lequita Halt Michaux-Mills is an 68 y.o. female.  HPI: [name] is a 68 yo woman with PMH of hypertension, recurrent chronic UTI, T2DM, dyspnea and fairly recent left knee surgery (september) who woke up with chest pain last night 01:00 - 02:00. She characterizes the pain as sharp/stabbing involving left-chest, some shoulder. It improved mildly with one hydrocodone and then she had intermittent discomfort until coming to the ER at 1-2pm today, day of admission. She required help from her daughter to get up as she also felt weak. She took aspirin as well and pain gradually resolved. She's currently chest pain free. There was concern in the ER for PE but CTA was negative but demonstrated concern for pyelonephritis leading to Hospitalist admission. However, mildly elevated troponin of 0.04 to 0.1 led to cardiology consultation. She's never had chest pain before. However, her mother had MI/CAD in her 50s/60s, she's a previous tobacco user (quit many years ago) and has diabetes mellitus, hypertension and potentially dyslipidemia. She was started on antibiotic therapy in the ER.    Past Medical History  Diagnosis Date  . Hypertension   . Exogenous obesity   . Arthritis   . Anxiety   . Depression   . Diabetes mellitus without complication   . Chronic UTI (urinary tract infection)   . Osteonecrosis   . Shortness of breath     with exertion     Past Surgical History  Procedure Laterality Date  . Total knee arthroplasty    . Tubal ligation    . Cataract surgery     . Finger surgery      related to tumors on finger removal   . Total knee arthroplasty Left 09/26/2013    Procedure: LEFT TOTAL KNEE ARTHROPLASTY;  Surgeon: Mauri Pole, MD;  Location: [facility];  Service: Orthopedics;  Laterality: Left;    Family History  Problem Relation Age of Onset  . Emphysema Father     Social History:   reports that she quit smoking about 3 years ago. Her smoking use included Cigarettes. She smoked 0.00 packs per day. She has never used smokeless tobacco. She reports that she does not drink alcohol or use illicit drugs.  Allergies:  Allergies  Allergen Reactions  . Ciprofloxacin Hives  . Codeine Nausea And Vomiting  . Prednisone     Elevated glucose level to 700    Medications: I have reviewed the patient's current medications.  Results for orders placed or performed during the hospital encounter of 01/21/14 (from the past 48 hour(s))  Comprehensive metabolic panel     Status: Abnormal   Collection Time: 01/21/14  3:33 PM  Result Value Ref Range   Sodium 138 135 - 145 mmol/L    Comment: Please note change in reference range.   Potassium 3.1 (L) 3.5 - 5.1 mmol/L    Comment: Please note change in reference range.   Chloride 101 96 - 112 mEq/L   CO2 22 19 - 32 mmol/L   Glucose, Bld 150 (H) 70 - 99 mg/dL   BUN 9 6 - 23 mg/dL   Creatinine, Ser 1.23 (H) 0.50 - 1.10 mg/dL   Calcium 10.1 8.4 - 10.5 mg/dL   Total Protein 7.4 6.0 - 8.3 g/dL   Albumin 3.4 (L) 3.5 - 5.2 g/dL   AST 57 (H) 0 - 37 U/L   ALT 30 0 - 35 U/L   Alkaline Phosphatase  119 (H) 39 - 117 U/L   Total Bilirubin 0.9 0.3 - 1.2 mg/dL   GFR calc non Af Amer 44 (L) >90 mL/min   GFR calc Af Amer 51 (L) >90 mL/min    Comment: (NOTE) The eGFR has been calculated using the CKD EPI equation. This calculation has not been validated in all clinical situations. eGFR's persistently <90 mL/min signify possible Chronic Kidney Disease.    Anion gap 15 5 - 15  CBC with Differential     Status: Abnormal   Collection Time: 01/21/14  3:33 PM  Result Value Ref Range   WBC 31.4 (H) 4.0 - 10.5 K/uL   RBC 4.69 3.87 - 5.11 MIL/uL   Hemoglobin 13.8 12.0 - 15.0 g/dL   HCT 42.1 36.0 - 46.0 %   MCV 89.8 78.0 - 100.0 fL   MCH 29.4 26.0 - 34.0 pg   MCHC 32.8 30.0 - 36.0 g/dL   RDW 13.8 11.5 - 15.5 %   Platelets 313 150 - 400 K/uL    Neutrophils Relative % 93 (H) 43 - 77 %   Lymphocytes Relative 2 (L) 12 - 46 %   Monocytes Relative 5 3 - 12 %   Eosinophils Relative 0 0 - 5 %   Basophils Relative 0 0 - 1 %   Neutro Abs 29.2 (H) 1.7 - 7.7 K/uL   Lymphs Abs 0.6 (L) 0.7 - 4.0 K/uL   Monocytes Absolute 1.6 (H) 0.1 - 1.0 K/uL   Eosinophils Absolute 0.0 0.0 - 0.7 K/uL   Basophils Absolute 0.0 0.0 - 0.1 K/uL   WBC Morphology INCREASED BANDS (>20% BANDS)     Comment: DOHLE BODIES  Magnesium     Status: Abnormal   Collection Time: 01/21/14  3:33 PM  Result Value Ref Range   Magnesium 0.9 (LL) 1.5 - 2.5 mg/dL    Comment: REPEATED TO VERIFY CRITICAL RESULT CALLED TO, READ BACK BY AND VERIFIED WITH: MEGAN WILLIAMS [id number] 01/21/14 BY WOOLLENK   Brain natriuretic peptide (order if patient c/o SOB ONLY)     Status: Abnormal   Collection Time: 01/21/14  3:33 PM  Result Value Ref Range   B Natriuretic Peptide 106.2 (H) 0.0 - 100.0 pg/mL    Comment: Please note change in reference range.  Troponin I (order at [facility])     Status: Abnormal   Collection Time: 01/21/14  3:33 PM  Result Value Ref Range   Troponin I 0.04 (H) <0.031 ng/mL    Comment:        PERSISTENTLY INCREASED TROPONIN VALUES IN THE RANGE OF 0.04-0.49 ng/mL CAN BE SEEN IN:       -UNSTABLE ANGINA       -CONGESTIVE HEART FAILURE       -MYOCARDITIS       -CHEST TRAUMA       -ARRYHTHMIAS       -LATE PRESENTING MYOCARDIAL INFARCTION       -COPD   CLINICAL FOLLOW-UP RECOMMENDED. Please note change in reference range.   Lipase, blood     Status: None   Collection Time: 01/21/14  3:33 PM  Result Value Ref Range   Lipase 38 11 - 59 U/L  I-stat troponin, ED (not at [facility])     Status: None   Collection Time: 01/21/14  3:49 PM  Result Value Ref Range   Troponin i, poc 0.02 0.00 - 0.08 ng/mL   Comment 3            Comment: Due  to the release kinetics of cTnI, a negative result within the first hours of the onset of symptoms does not rule out myocardial infarction  with certainty. If myocardial infarction is still suspected, repeat the test at appropriate intervals.   I-Stat Chem 8, ED     Status: Abnormal   Collection Time: 01/21/14  3:51 PM  Result Value Ref Range   Sodium 141 135 - 145 mmol/L   Potassium 3.0 (L) 3.5 - 5.1 mmol/L   Chloride 100 96 - 112 mEq/L   BUN 10 6 - 23 mg/dL   Creatinine, Ser 1.10 0.50 - 1.10 mg/dL   Glucose, Bld 154 (H) 70 - 99 mg/dL   Calcium, Ion 1.15 1.13 - 1.30 mmol/L   TCO2 21 0 - 100 mmol/L   Hemoglobin 16.0 (H) 12.0 - 15.0 g/dL   HCT 47.0 (H) 36.0 - 46.0 %  Urinalysis, Routine w reflex microscopic     Status: Abnormal   Collection Time: 01/21/14  6:27 PM  Result Value Ref Range   Color, Urine AMBER (A) YELLOW    Comment: BIOCHEMICALS MAY BE AFFECTED BY COLOR   APPearance TURBID (A) CLEAR   Specific Gravity, Urine 1.031 (H) 1.005 - 1.030   pH 5.0 5.0 - 8.0   Glucose, UA NEGATIVE NEGATIVE mg/dL   Hgb urine dipstick LARGE (A) NEGATIVE   Bilirubin Urine NEGATIVE NEGATIVE   Ketones, ur NEGATIVE NEGATIVE mg/dL   Protein, ur 30 (A) NEGATIVE mg/dL   Urobilinogen, UA 1.0 0.0 - 1.0 mg/dL   Nitrite NEGATIVE NEGATIVE   Leukocytes, UA LARGE (A) NEGATIVE  Urine microscopic-add on     Status: Abnormal   Collection Time: 01/21/14  6:27 PM  Result Value Ref Range   Squamous Epithelial / LPF FEW (A) RARE   WBC, UA TOO NUMEROUS TO COUNT <3 WBC/hpf   RBC / HPF 3-6 <3 RBC/hpf   Bacteria, UA MANY (A) RARE  Troponin I     Status: Abnormal   Collection Time: 01/21/14  7:58 PM  Result Value Ref Range   Troponin I 0.10 (H) <0.031 ng/mL    Comment:        PERSISTENTLY INCREASED TROPONIN VALUES IN THE RANGE OF 0.04-0.49 ng/mL CAN BE SEEN IN:       -UNSTABLE ANGINA       -CONGESTIVE HEART FAILURE       -MYOCARDITIS       -CHEST TRAUMA       -ARRYHTHMIAS       -LATE PRESENTING MYOCARDIAL INFARCTION       -COPD   CLINICAL FOLLOW-UP RECOMMENDED. Please note change in reference range.   Lactic acid, plasma     Status:  Abnormal   Collection Time: 01/21/14  8:57 PM  Result Value Ref Range   Lactic Acid, Venous 4.7 (H) 0.5 - 2.2 mmol/L    Ct Angio Chest Pe W/cm &/or Wo Cm  01/21/2014   CLINICAL DATA:  Chest pain, chronic shortness of breath with worsening in last 2 days, history of COPD  EXAM: CT ANGIOGRAPHY CHEST WITH CONTRAST  TECHNIQUE: Multidetector CT imaging of the chest was performed using the standard protocol during bolus administration of intravenous contrast. Multiplanar CT image reconstructions and MIPs were obtained to evaluate the vascular anatomy.  CONTRAST:  163m OMNIPAQUE IOHEXOL 350 MG/ML SOLN  COMPARISON:  None.  FINDINGS: There are streaky artifacts from patient's large body habitus. Degenerative changes are noted right shoulder.  Sagittal images of the spine shows multilevel degenerative  changes thoracic spine. Sagittal view of the sternum is unremarkable.  Images of the thoracic inlet are unremarkable. Central airways are patent. Atherosclerotic calcifications of thoracic aorta are noted. Mild atherosclerotic calcifications of coronary arteries. No aortic aneurysm. No hilar or mediastinal adenopathy.  The study is of excellent technical quality. No pulmonary embolus is noted.  Images of the lung parenchyma shows no pulmonary edema. No segmental infiltrate. Mild atelectasis and minimal pleural thickening noted bilateral lower lobe posteriorly. No rib fractures are noted.  There is mild hepatic fatty infiltration.  The visualized upper abdomen shows global decreased enhancement upper pole of the left kidney with loss of cortico medullary differentiation. Diffuse inflammatory process mass or infection cannot be excluded. Clinical correlation is necessary. Follow-up nonemergent enhanced study is recommended for further correlation.  Review of the MIP images confirms the above findings.  IMPRESSION: 1. No pulmonary embolus is noted. 2. No aortic aneurysm. No acute infiltrate or pulmonary edema. Mild  dependent atelectasis and pleural thickening lung bases posteriorly. 3. Atherosclerotic calcifications of thoracic aorta and mild coronary artery calcifications. 4. Degenerative changes thoracic spine. 5. Abnormal decreased enhancement with ill-defined contour and loss of normal cortical medullary differentiation upper pole of the left kidney. Inflammatory process, mass or infection cannot be excluded. Clinical correlation is necessary. Follow-up enhanced study is recommended as clinically warranted.   Electronically Signed   By: Lahoma Crocker M.D.   On: 01/21/2014 17:41   Dg Chest Portable 1 View  01/21/2014   CLINICAL DATA:  Left-sided chest pain.  EXAM: PORTABLE CHEST - 1 VIEW  COMPARISON:  PA and lateral chest 09/19/2013.  FINDINGS: Lung volumes are low but the lungs are clear. Heart size is normal. No pneumothorax or pleural effusion. Remote surgical neck fracture right humerus is noted.  IMPRESSION: No acute disease.   Electronically Signed   By: Inge Rise M.D.   On: 01/21/2014 15:56    Review of Systems  Constitutional: Positive for malaise/fatigue. Negative for fever, chills and diaphoresis.  HENT: Negative for tinnitus.   Eyes: Negative for double vision and photophobia.  Respiratory: Positive for shortness of breath. Negative for cough and sputum production.   Cardiovascular: Positive for chest pain. Negative for palpitations and orthopnea.  Gastrointestinal: Negative for nausea and vomiting.  Genitourinary: Positive for dysuria. Negative for hematuria.  Musculoskeletal: Positive for joint pain. Negative for myalgias and neck pain.  Skin: Negative for rash.  Neurological: Positive for weakness. Negative for dizziness, tingling, tremors and headaches.  Endo/Heme/Allergies: Does not bruise/bleed easily.  Psychiatric/Behavioral: Negative for depression, suicidal ideas and substance abuse.   Blood pressure 132/58, pulse 115, temperature 98.1 F (36.7 C), temperature source Oral, resp.  rate 28, height '5\' 3"'  (1.6 m), weight 101.742 kg (224 lb 4.8 oz), last menstrual period 06/23/1995, SpO2 98 %. Physical Exam  Nursing note and vitals reviewed. Constitutional: She is oriented to person, place, and time. She appears well-developed and well-nourished. She appears distressed.  HENT:  Head: Normocephalic and atraumatic.  Nose: Nose normal.  Mouth/Throat: Oropharynx is clear and moist. No oropharyngeal exudate.  Eyes: Conjunctivae and EOM are normal. Pupils are equal, round, and reactive to light. No scleral icterus.  Neck: Normal range of motion. Neck supple. No JVD present. No tracheal deviation present.  Cardiovascular: Regular rhythm, normal heart sounds and intact distal pulses.  Exam reveals no gallop.   No murmur heard. Sinus tachycardia  Respiratory: Effort normal and breath sounds normal. She has no rales.  GI: Soft. Bowel sounds are  normal. She exhibits no distension. There is no tenderness. There is no rebound.  Musculoskeletal: Normal range of motion. She exhibits no edema.  Neurological: She is alert and oriented to person, place, and time. No cranial nerve deficit.  Skin: Skin is warm and dry. No rash noted. She is not diaphoretic. No erythema.  Psychiatric: She has a normal mood and affect. Her behavior is normal. Thought content normal.  labs reviewed; na 141, K 3.0, bun/cr 10/1.1, lactate 4.7 Trop 0.04 to 0.10 ECG: sinus tachycardia, HR 120s, anterior infarct, subtle ST depressions laterally  Assessment/Plan: Problem List Chest Pain Mildly elevated troponin/? NSTEMI Pyelonephritis Elevated Lactate T2DM Hypertension Dyslipidemia Hypokalemia  Shannon Lynch is a 68 yo woman with PMH of hypertension, recurrent chronic UTI, T2DM, dyspnea and fairly recent left knee surgery (september) who woke up with chest pain. Differential diagnosis is musculoskeletal pain, esophageal spasm, GERD, pericarditis, ACS/NSTEMI among other etiologies. I favor a  diagnosis of NSTEMI - type I vs type II. However, this may just be unmasked mild to moderate CAD in setting of pyelonephritis. Will await next troponin to determine if heparin should be added. In the interim, continue treatment for infection, trend cardiac biomarkers, telemetry, NPO after MN. Based on response to therapy, ischemic evaluation may be deferred until she is stable.   - NPO after MN - trend cardiac markers - observation on telemetry - asa 81 mg, start atorvastatin 80 mg qHS, first dose now - update hba1c, tsh, lipid panel, BNP - echocardiogram in AM   Shannon Lynch 01/21/2014, 10:34 PM   Addendum: repeat troponin was 0.1 so heparin was not started. This appears to be a demand ischemia, type II nSTEMI so heparin not started. This is likely in setting of stressor/infection. As this clears up, anticipate consideration of functional study, likely pharmacologic given knee pain/limitations.

## 2014-01-22 DIAGNOSIS — I248 Other forms of acute ischemic heart disease: Secondary | ICD-10-CM

## 2014-01-22 DIAGNOSIS — I10 Essential (primary) hypertension: Secondary | ICD-10-CM

## 2014-01-22 DIAGNOSIS — E119 Type 2 diabetes mellitus without complications: Secondary | ICD-10-CM

## 2014-01-22 DIAGNOSIS — R079 Chest pain, unspecified: Secondary | ICD-10-CM | POA: Clinically undetermined

## 2014-01-22 DIAGNOSIS — E1101 Type 2 diabetes mellitus with hyperosmolarity with coma: Secondary | ICD-10-CM

## 2014-01-22 DIAGNOSIS — I517 Cardiomegaly: Secondary | ICD-10-CM

## 2014-01-22 DIAGNOSIS — E876 Hypokalemia: Secondary | ICD-10-CM

## 2014-01-22 DIAGNOSIS — N12 Tubulo-interstitial nephritis, not specified as acute or chronic: Secondary | ICD-10-CM

## 2014-01-22 LAB — CREATININE, SERUM
Creatinine, Ser: 1.2 mg/dL — ABNORMAL HIGH (ref 0.50–1.10)
GFR calc Af Amer: 52 mL/min — ABNORMAL LOW (ref >90)
GFR calc non Af Amer: 45 mL/min — ABNORMAL LOW (ref >90)

## 2014-01-22 LAB — TSH: TSH: 2.653 u[IU]/mL (ref 0.350–4.500)

## 2014-01-22 LAB — COMPREHENSIVE METABOLIC PANEL
ALBUMIN: 2.9 g/dL — AB (ref 3.5–5.2)
ALK PHOS: 94 U/L (ref 39–117)
ALT: 24 U/L (ref 0–35)
AST: 33 U/L (ref 0–37)
Anion gap: 12 (ref 5–15)
BILIRUBIN TOTAL: 0.7 mg/dL (ref 0.3–1.2)
BUN: 13 mg/dL (ref 6–23)
CHLORIDE: 102 meq/L (ref 96–112)
CO2: 23 mmol/L (ref 19–32)
Calcium: 9.6 mg/dL (ref 8.4–10.5)
Creatinine, Ser: 1.15 mg/dL — ABNORMAL HIGH (ref 0.50–1.10)
GFR calc Af Amer: 55 mL/min — ABNORMAL LOW (ref >90)
GFR, EST NON AFRICAN AMERICAN: 47 mL/min — AB (ref >90)
Glucose, Bld: 123 mg/dL — ABNORMAL HIGH (ref 70–99)
POTASSIUM: 4.3 mmol/L (ref 3.5–5.1)
Sodium: 137 mmol/L (ref 135–145)
Total Protein: 6.7 g/dL (ref 6.0–8.3)

## 2014-01-22 LAB — CBC
HCT: 37.7 % (ref 36.0–46.0)
HEMATOCRIT: 36.6 % (ref 36.0–46.0)
HEMOGLOBIN: 12.2 g/dL (ref 12.0–15.0)
Hemoglobin: 12 g/dL (ref 12.0–15.0)
MCH: 28.5 pg (ref 26.0–34.0)
MCH: 29 pg (ref 26.0–34.0)
MCHC: 32.4 g/dL (ref 30.0–36.0)
MCHC: 32.8 g/dL (ref 30.0–36.0)
MCV: 88.1 fL (ref 78.0–100.0)
MCV: 88.4 fL (ref 78.0–100.0)
Platelets: 314 10*3/uL (ref 150–400)
Platelets: 293 10*3/uL (ref 150–400)
RBC: 4.28 MIL/uL (ref 3.87–5.11)
RBC: 4.14 MIL/uL (ref 3.87–5.11)
RDW: 14 % (ref 11.5–15.5)
RDW: 14.1 % (ref 11.5–15.5)
WBC: 41 10*3/uL — ABNORMAL HIGH (ref 4.0–10.5)
WBC: 36.1 10*3/uL — AB (ref 4.0–10.5)

## 2014-01-22 LAB — GLUCOSE, CAPILLARY
GLUCOSE-CAPILLARY: 119 mg/dL — AB (ref 70–99)
Glucose-Capillary: 138 mg/dL — ABNORMAL HIGH (ref 70–99)

## 2014-01-22 LAB — MAGNESIUM
MAGNESIUM: 0.9 mg/dL — AB (ref 1.5–2.5)
Magnesium: 1.6 mg/dL (ref 1.5–2.5)

## 2014-01-22 LAB — TROPONIN I
TROPONIN I: 0.1 ng/mL — AB (ref <0.031)
Troponin I: 0.1 ng/mL — ABNORMAL HIGH (ref <0.031)
Troponin I: 0.07 ng/mL — ABNORMAL HIGH (ref <0.031)

## 2014-01-22 LAB — HEMOGLOBIN A1C
HEMOGLOBIN A1C: 6.9 % — AB (ref <5.7)
Mean Plasma Glucose: 151 mg/dL — ABNORMAL HIGH (ref <117)

## 2014-01-22 LAB — PROCALCITONIN: PROCALCITONIN: 38.89 ng/mL

## 2014-01-22 LAB — PHOSPHORUS: Phosphorus: 2.5 mg/dL (ref 2.3–4.6)

## 2014-01-22 MED ORDER — GLUCERNA SHAKE PO LIQD
237.0000 mL | Freq: Two times a day (BID) | ORAL | Status: DC
  Administered 2014-01-23 – 2014-01-25 (×4): 237 mL via ORAL

## 2014-01-22 MED ORDER — MENTHOL 3 MG MT LOZG
1.0000 | LOZENGE | OROMUCOSAL | Status: DC | PRN
  Filled 2014-01-22: qty 9

## 2014-01-22 MED ORDER — ENOXAPARIN SODIUM 40 MG/0.4ML ~~LOC~~ SOLN
40.0000 mg | Freq: Every day | SUBCUTANEOUS | Status: DC
Start: 2014-01-22 — End: 2014-01-25
  Administered 2014-01-22 – 2014-01-24 (×3): 40 mg via SUBCUTANEOUS
  Filled 2014-01-22 (×4): qty 0.4

## 2014-01-22 MED ORDER — IPRATROPIUM BROMIDE 0.02 % IN SOLN
0.5000 mg | Freq: Four times a day (QID) | RESPIRATORY_TRACT | Status: DC | PRN
  Administered 2014-01-23 (×2): 0.5 mg via RESPIRATORY_TRACT
  Filled 2014-01-22 (×2): qty 2.5

## 2014-01-22 MED ORDER — SODIUM CHLORIDE 0.9 % IV SOLN
INTRAVENOUS | Status: DC
  Administered 2014-01-22 – 2014-01-23 (×2): via INTRAVENOUS

## 2014-01-22 MED ORDER — GUAIFENESIN ER 600 MG PO TB12
600.0000 mg | ORAL_TABLET | Freq: Two times a day (BID) | ORAL | Status: DC
  Administered 2014-01-22 – 2014-01-25 (×6): 600 mg via ORAL
  Filled 2014-01-22 (×7): qty 1

## 2014-01-22 NOTE — Progress Notes (Signed)
01/22/2014 03:00 AM Dr. Claiborne Billings was paged that the patient's troponin level this morning was 0.10 as the one before it. Will continue to monitor the patient. Lupita Dawn

## 2014-01-22 NOTE — Progress Notes (Signed)
Patient Name: Shannon Lynch Date of Encounter: 01/22/2014  Active Problems:   Essential hypertension   Type 2 diabetes mellitus with hyperosmolar nonketotic hyperglycemia   Chest pain   Pyelonephritis   Sepsis   Hypokalemia   Elevated troponin   Primary Cardiologist: New  Patient Profile: 68 yo female w/ hx HTN, UTI, DM, L-TKR, no hx CAD, was admitted 12/27 w/ chest pain, pyelonephritis.  SUBJECTIVE: Chest pain on admission started sternal area and radiated along lower ribs to back. This has improved, has had some upper abd pain. Upset that she is NPO, that she has not had her usual medications.   OBJECTIVE Filed Vitals:   01/21/14 2300 01/22/14 0102 01/22/14 0409 01/22/14 0839  BP:   103/45   Pulse: 116  112   Temp:   99 F (37.2 C)   TempSrc:   Oral   Resp: 29  20   Height:      Weight:      SpO2: 98% 98% 96% 95%    Intake/Output Summary (Last 24 hours) at 01/22/14 5427 Last data filed at 01/22/14 0050  Gross per 24 hour  Intake   2200 ml  Output      0 ml  Net   2200 ml   Filed Weights   01/21/14 2153  Weight: 224 lb 4.8 oz (101.742 kg)    PHYSICAL EXAM General: Well developed, well nourished, female in no acute distress. Head: Normocephalic, atraumatic.  Neck: Supple without bruits, JVD not elevated. Lungs:  Resp regular and unlabored, rales bases. Heart: RRR, S1, S2, no S3, S4, soft murmur; no rub. Abdomen: Soft, non-tender, non-distended, BS + x 4.  Extremities: No clubbing, cyanosis, no edema.  Neuro: Alert and oriented X 3. Moves all extremities spontaneously. Psych: Normal affect.  LABS: CBC: Recent Labs  01/21/14 1533  01/22/14 0105 01/22/14 0652  WBC 31.4*  --  41.0* 36.1*  NEUTROABS 29.2*  --   --   --   HGB 13.8  < > 12.2 12.0  HCT 42.1  < > 37.7 36.6  MCV 89.8  --  88.1 88.4  PLT 313  --  314 293  < > = values in this interval not displayed. INR:No results for input(s): INR in the last 72 hours. Basic Metabolic  Panel: Recent Labs  01/21/14 1533 01/21/14 1551 01/22/14 0105 01/22/14 0652  NA 138 141  --  137  K 3.1* 3.0*  --  4.3  CL 101 100  --  102  CO2 22  --   --  23  GLUCOSE 150* 154*  --  123*  BUN 9 10  --  13  CREATININE 1.23* 1.10 1.20* 1.15*  CALCIUM 10.1  --   --  9.6  MG 0.9*  --   --  1.6  PHOS  --   --   --  2.5   Liver Function Tests: Recent Labs  01/21/14 1533 01/22/14 0652  AST 57* 33  ALT 30 24  ALKPHOS 119* 94  BILITOT 0.9 0.7  PROT 7.4 6.7  ALBUMIN 3.4* 2.9*   Cardiac Enzymes: Recent Labs  01/21/14 1958 01/22/14 0105 01/22/14 0652  TROPONINI 0.10* 0.10* 0.10*    Recent Labs  01/21/14 1549  TROPIPOC 0.02   Thyroid Function Tests: Recent Labs  01/22/14 0652  TSH 2.653    TELE:  SR, ST  Radiology/Studies: Ct Angio Chest Pe W/cm &/or Wo Cm 01/21/2014   CLINICAL DATA:  Chest  pain, chronic shortness of breath with worsening in last 2 days, history of COPD  EXAM: CT ANGIOGRAPHY CHEST WITH CONTRAST  TECHNIQUE: Multidetector CT imaging of the chest was performed using the standard protocol during bolus administration of intravenous contrast. Multiplanar CT image reconstructions and MIPs were obtained to evaluate the vascular anatomy.  CONTRAST:  183mL OMNIPAQUE IOHEXOL 350 MG/ML SOLN  COMPARISON:  None.  FINDINGS: There are streaky artifacts from patient's large body habitus. Degenerative changes are noted right shoulder.  Sagittal images of the spine shows multilevel degenerative changes thoracic spine. Sagittal view of the sternum is unremarkable.  Images of the thoracic inlet are unremarkable. Central airways are patent. Atherosclerotic calcifications of thoracic aorta are noted. Mild atherosclerotic calcifications of coronary arteries. No aortic aneurysm. No hilar or mediastinal adenopathy.  The study is of excellent technical quality. No pulmonary embolus is noted.  Images of the lung parenchyma shows no pulmonary edema. No segmental infiltrate. Mild  atelectasis and minimal pleural thickening noted bilateral lower lobe posteriorly. No rib fractures are noted.  There is mild hepatic fatty infiltration.  The visualized upper abdomen shows global decreased enhancement upper pole of the left kidney with loss of cortico medullary differentiation. Diffuse inflammatory process mass or infection cannot be excluded. Clinical correlation is necessary. Follow-up nonemergent enhanced study is recommended for further correlation.  Review of the MIP images confirms the above findings.  IMPRESSION: 1. No pulmonary embolus is noted. 2. No aortic aneurysm. No acute infiltrate or pulmonary edema. Mild dependent atelectasis and pleural thickening lung bases posteriorly. 3. Atherosclerotic calcifications of thoracic aorta and mild coronary artery calcifications. 4. Degenerative changes thoracic spine. 5. Abnormal decreased enhancement with ill-defined contour and loss of normal cortical medullary differentiation upper pole of the left kidney. Inflammatory process, mass or infection cannot be excluded. Clinical correlation is necessary. Follow-up enhanced study is recommended as clinically warranted.   Electronically Signed   By: Lahoma Crocker M.D.   On: 01/21/2014 17:41   Dg Chest Portable 1 View 01/21/2014   CLINICAL DATA:  Left-sided chest pain.  EXAM: PORTABLE CHEST - 1 VIEW  COMPARISON:  PA and lateral chest 09/19/2013.  FINDINGS: Lung volumes are low but the lungs are clear. Heart size is normal. No pneumothorax or pleural effusion. Remote surgical neck fracture right humerus is noted.  IMPRESSION: No acute disease.   Electronically Signed   By: Inge Rise M.D.   On: 01/21/2014 15:56     Current Medications:  . aspirin EC  81 mg Oral Daily  . atorvastatin  80 mg Oral q1800  . cefTRIAXone (ROCEPHIN)  IV  1 g Intravenous Q24H  . clorazepate  1.875 mg Oral Q1200  . clorazepate  3.75 mg Oral BID  . docusate sodium  100 mg Oral BID  . DULoxetine  60 mg Oral q  morning - 10a  . enoxaparin (LOVENOX) injection  30 mg Subcutaneous QHS  . HYDROcodone-acetaminophen  1 tablet Oral 6 times per day  . insulin aspart  0-9 Units Subcutaneous 6 times per day  . losartan  100 mg Oral q morning - 10a  . pantoprazole  40 mg Oral Q1200  . polyethylene glycol  17 g Oral BID  . sodium chloride  3 mL Intravenous Q12H      ASSESSMENT AND PLAN: Active Problems:   Essential hypertension - per IM, SBP range 103-132 on current rx    Type 2 diabetes mellitus with hyperosmolar nonketotic hyperglycemia - per IM    Chest  pain - atypical and has resolved, minimal troponin elevation (no CKMB performed) with no crescendo/decrescendo pattern. Agree with echo, consider cath if EF decreased. Have cancelled NPO as do not think we would do stress test today (no info on EF and WBC still > 30,000). MD to review data and advise on MV vs cath, and if should be done as inpatient. She is on ASA, statin, ARB. HR is not low, so will add low-dose Coreg with parameters.    Pyelonephritis - per IM    Sepsis - improving, per IM    Hypokalemia - supp and improved, per IM    Elevated troponin - ?demand ischemia, see above  Signed, Rosaria Ferries , PA-C 9:18 AM 01/22/2014  Personally seen and examined. Agree with above. Troponin elevation 0.1 in the setting of sepsis/DM/acute illness - likely Demand ischemia.   Given her age and comorbidities, I would advocate for stress test as outpatient for further risk stratification. If high risk then cath.   Candee Furbish, MD

## 2014-01-22 NOTE — Progress Notes (Signed)
INITIAL NUTRITION ASSESSMENT  DOCUMENTATION CODES Per approved criteria  -Obesity Unspecified   INTERVENTION: Provide Glucerna Shake po BID, each supplement provides 220 kcal and 10 grams of protein.  Encourage adequate PO intake.   NUTRITION DIAGNOSIS: Inadequate oral intake related to decreased appetite as evidenced by pt report.   Goal: Pt to meet >/= 90% of their estimated nutrition needs   Monitor:  PO intake, weight trends, labs, I/O's  Reason for Assessment: MST  68 y.o. female  Admitting Dx: Pyelonephritis  ASSESSMENT: Pt has a past medical history of Hypertension; Exogenous obesity; Arthritis; Anxiety; Depression; Diabetes mellitus without complication; Chronic UTI (urinary tract infection); Osteonecrosis; and Shortness of breath. Initially she was evaluated for PE with CTA chest which showed not PE but evidence of pyelonephritis.  Pt reports having a decreased appetite. Pt reports she has not been eating very well at home, however has been trying to make sure she is getting enough nutrition. Pt reports she has been making herself fruit smoothie drinks at home in the mornings.Pt unable to quantify the amount of food she regular has been eating at home PTA. Family at bedside, however is unsure of how the pt has been eating. Weight has been stable. Pt reports she only has had a couple of bites of food today.  Pt is agreeable to oral supplements, Glucerna. RD to order. Pt was encouraged to eat her food at meals and to drink her supplements.   Pt with no observed significant fat or muscle mass loss.   Labs: Low GFR. High Creatinine.  Height: Ht Readings from Last 1 Encounters:  01/21/14 5\' 3"  (1.6 m)    Weight: Wt Readings from Last 1 Encounters:  01/21/14 224 lb 4.8 oz (101.742 kg)    Ideal Body Weight: 115 lbs  % Ideal Body Weight: 1955  Wt Readings from Last 10 Encounters:  01/21/14 224 lb 4.8 oz (101.742 kg)  09/26/13 227 lb (102.967 kg)  09/19/13 227 lb  (102.967 kg)  06/22/12 225 lb (102.059 kg)  02/10/11 214 lb 15.2 oz (97.5 kg)  12/15/10 234 lb (106.142 kg)  06/25/10 232 lb (105.235 kg)  12/30/09 232 lb (105.235 kg)  08/08/09 234 lb (106.142 kg)  10/15/08 222 lb (100.699 kg)    Usual Body Weight: 224 lbs  % Usual Body Weight: 100%  BMI:  Body mass index is 39.74 kg/(m^2). Class II obesity  Estimated Nutritional Needs: Kcal: 2100-2300 Protein: 100-120 grams  Fluid: 2.1 - 2.3 L.day  Skin: intact  Diet Order: Diet heart healthy/carb modified  EDUCATION NEEDS: -No education needs identified at this time   Intake/Output Summary (Last 24 hours) at 01/22/14 1624 Last data filed at 01/22/14 0745  Gross per 24 hour  Intake   2200 ml  Output      0 ml  Net   2200 ml    Last BM: 12/28  Labs:   Recent Labs Lab 01/21/14 1533 01/21/14 1551 01/22/14 0105 01/22/14 0652  NA 138 141  --  137  K 3.1* 3.0*  --  4.3  CL 101 100  --  102  CO2 22  --   --  23  BUN 9 10  --  13  CREATININE 1.23* 1.10 1.20* 1.15*  CALCIUM 10.1  --   --  9.6  MG 0.9*  --   --  1.6  PHOS  --   --   --  2.5  GLUCOSE 150* 154*  --  123*  CBG (last 3)   Recent Labs  01/21/14 2332 01/22/14 0405 01/22/14 1122  GLUCAP 112* 119* 138*    Scheduled Meds: . aspirin EC  81 mg Oral Daily  . atorvastatin  80 mg Oral q1800  . cefTRIAXone (ROCEPHIN)  IV  1 g Intravenous Q24H  . clorazepate  1.875 mg Oral Q1200  . clorazepate  3.75 mg Oral BID  . docusate sodium  100 mg Oral BID  . DULoxetine  60 mg Oral q morning - 10a  . enoxaparin (LOVENOX) injection  40 mg Subcutaneous QHS  . HYDROcodone-acetaminophen  1 tablet Oral 6 times per day  . insulin aspart  0-9 Units Subcutaneous 6 times per day  . losartan  100 mg Oral q morning - 10a  . pantoprazole  40 mg Oral Q1200  . polyethylene glycol  17 g Oral BID  . sodium chloride  3 mL Intravenous Q12H    Continuous Infusions: . sodium chloride      Past Medical History  Diagnosis Date   . Hypertension   . Exogenous obesity   . Arthritis   . Anxiety   . Depression   . Diabetes mellitus without complication   . Chronic UTI (urinary tract infection)   . Osteonecrosis   . Shortness of breath     with exertion     Past Surgical History  Procedure Laterality Date  . Total knee arthroplasty    . Tubal ligation    . Cataract surgery     . Finger surgery      related to tumors on finger removal   . Total knee arthroplasty Left 09/26/2013    Procedure: LEFT TOTAL KNEE ARTHROPLASTY;  Surgeon: Mauri Pole, MD;  Location: [facility];  Service: Orthopedics;  Laterality: Left;    Kallie Locks, MS, RD, LDN Pager # [phone] After hours/ weekend pager # [phone]

## 2014-01-22 NOTE — Progress Notes (Signed)
[facility] PROGRESS NOTE  Shannon Lynch KPT:[id number] DOB: [birth year] DOA: 01/21/2014 PCP: Thressa Sheller, MD  Assessment/Plan:  Principal Problem:   Pyelonephritis: continue rocephin. Await cultures. Continue IVF Active Problems:   Essential hypertension   DM type 2 (diabetes mellitus, type 2)   Morbid obesity   Chest pain: echo pending   Sepsis   Hypokalemia corrected   Elevated troponin  HPI/Subjective: No CP. Some transient dyspnea. Left flank pain. tired  Objective: Filed Vitals:   01/22/14 1250  BP: 124/51  Pulse:   Temp:   Resp:     Intake/Output Summary (Last 24 hours) at 01/22/14 1607 Last data filed at 01/22/14 0745  Gross per 24 hour  Intake   2200 ml  Output      0 ml  Net   2200 ml   Filed Weights   01/21/14 2153  Weight: 101.742 kg (224 lb 4.8 oz)    Exam:   General:  In bed. Sleepy. comfortable  Cardiovascular: RRR without MGR  Respiratory: CTA without WRR  Abdomen: s, nt, nd  Ext: no CCE  Basic Metabolic Panel:  Recent Labs Lab 01/21/14 1533 01/21/14 1551 01/22/14 0105 01/22/14 0652  NA 138 141  --  137  K 3.1* 3.0*  --  4.3  CL 101 100  --  102  CO2 22  --   --  23  GLUCOSE 150* 154*  --  123*  BUN 9 10  --  13  CREATININE 1.23* 1.10 1.20* 1.15*  CALCIUM 10.1  --   --  9.6  MG 0.9*  --   --  1.6  PHOS  --   --   --  2.5   Liver Function Tests:  Recent Labs Lab 01/21/14 1533 01/22/14 0652  AST 57* 33  ALT 30 24  ALKPHOS 119* 94  BILITOT 0.9 0.7  PROT 7.4 6.7  ALBUMIN 3.4* 2.9*    Recent Labs Lab 01/21/14 1533  LIPASE 38   No results for input(s): AMMONIA in the last 168 hours. CBC:  Recent Labs Lab 01/21/14 1533 01/21/14 1551 01/22/14 0105 01/22/14 0652  WBC 31.4*  --  41.0* 36.1*  NEUTROABS 29.2*  --   --   --   HGB 13.8 16.0* 12.2 12.0  HCT 42.1 47.0* 37.7 36.6  MCV 89.8  --  88.1 88.4  PLT 313  --  314 293   Cardiac Enzymes:  Recent Labs Lab 01/21/14 1533  01/21/14 1958 01/22/14 0105 01/22/14 0652 01/22/14 1240  TROPONINI 0.04* 0.10* 0.10* 0.10* 0.07*   BNP (last 3 results) No results for input(s): PROBNP in the last 8760 hours. CBG:  Recent Labs Lab 01/21/14 2332 01/22/14 0405 01/22/14 1122  GLUCAP 112* 119* 138*    No results found for this or any previous visit (from the past 240 hour(s)).   Studies: Ct Angio Chest Pe W/cm &/or Wo Cm  01/21/2014   CLINICAL DATA:  Chest pain, chronic shortness of breath with worsening in last 2 days, history of COPD  EXAM: CT ANGIOGRAPHY CHEST WITH CONTRAST  TECHNIQUE: Multidetector CT imaging of the chest was performed using the standard protocol during bolus administration of intravenous contrast. Multiplanar CT image reconstructions and MIPs were obtained to evaluate the vascular anatomy.  CONTRAST:  141mL OMNIPAQUE IOHEXOL 350 MG/ML SOLN  COMPARISON:  None.  FINDINGS: There are streaky artifacts from patient's large body habitus. Degenerative changes are noted right shoulder.  Sagittal images of the spine shows multilevel degenerative changes  thoracic spine. Sagittal view of the sternum is unremarkable.  Images of the thoracic inlet are unremarkable. Central airways are patent. Atherosclerotic calcifications of thoracic aorta are noted. Mild atherosclerotic calcifications of coronary arteries. No aortic aneurysm. No hilar or mediastinal adenopathy.  The study is of excellent technical quality. No pulmonary embolus is noted.  Images of the lung parenchyma shows no pulmonary edema. No segmental infiltrate. Mild atelectasis and minimal pleural thickening noted bilateral lower lobe posteriorly. No rib fractures are noted.  There is mild hepatic fatty infiltration.  The visualized upper abdomen shows global decreased enhancement upper pole of the left kidney with loss of cortico medullary differentiation. Diffuse inflammatory process mass or infection cannot be excluded. Clinical correlation is necessary.  Follow-up nonemergent enhanced study is recommended for further correlation.  Review of the MIP images confirms the above findings.  IMPRESSION: 1. No pulmonary embolus is noted. 2. No aortic aneurysm. No acute infiltrate or pulmonary edema. Mild dependent atelectasis and pleural thickening lung bases posteriorly. 3. Atherosclerotic calcifications of thoracic aorta and mild coronary artery calcifications. 4. Degenerative changes thoracic spine. 5. Abnormal decreased enhancement with ill-defined contour and loss of normal cortical medullary differentiation upper pole of the left kidney. Inflammatory process, mass or infection cannot be excluded. Clinical correlation is necessary. Follow-up enhanced study is recommended as clinically warranted.   Electronically Signed   By: Lahoma Crocker M.D.   On: 01/21/2014 17:41   Dg Chest Portable 1 View  01/21/2014   CLINICAL DATA:  Left-sided chest pain.  EXAM: PORTABLE CHEST - 1 VIEW  COMPARISON:  PA and lateral chest 09/19/2013.  FINDINGS: Lung volumes are low but the lungs are clear. Heart size is normal. No pneumothorax or pleural effusion. Remote surgical neck fracture right humerus is noted.  IMPRESSION: No acute disease.   Electronically Signed   By: Inge Rise M.D.   On: 01/21/2014 15:56    Scheduled Meds: . aspirin EC  81 mg Oral Daily  . atorvastatin  80 mg Oral q1800  . cefTRIAXone (ROCEPHIN)  IV  1 g Intravenous Q24H  . clorazepate  1.875 mg Oral Q1200  . clorazepate  3.75 mg Oral BID  . docusate sodium  100 mg Oral BID  . DULoxetine  60 mg Oral q morning - 10a  . enoxaparin (LOVENOX) injection  40 mg Subcutaneous QHS  . HYDROcodone-acetaminophen  1 tablet Oral 6 times per day  . insulin aspart  0-9 Units Subcutaneous 6 times per day  . losartan  100 mg Oral q morning - 10a  . pantoprazole  40 mg Oral Q1200  . polyethylene glycol  17 g Oral BID  . sodium chloride  3 mL Intravenous Q12H   Continuous Infusions:   Time spent: 35  minutes  [facility] Text page [email or web address], password [facility] 01/22/2014, 4:07 PM  LOS: 1 day

## 2014-01-22 NOTE — Progress Notes (Signed)
  Echocardiogram 2D Echocardiogram has been performed.  Diamond Nickel 01/22/2014, 12:19 PM

## 2014-01-23 ENCOUNTER — Other Ambulatory Visit: Payer: Self-pay | Admitting: Physician Assistant

## 2014-01-23 DIAGNOSIS — R7989 Other specified abnormal findings of blood chemistry: Principal | ICD-10-CM

## 2014-01-23 DIAGNOSIS — R072 Precordial pain: Secondary | ICD-10-CM

## 2014-01-23 DIAGNOSIS — R778 Other specified abnormalities of plasma proteins: Secondary | ICD-10-CM

## 2014-01-23 LAB — GLUCOSE, CAPILLARY
GLUCOSE-CAPILLARY: 125 mg/dL — AB (ref 70–99)
GLUCOSE-CAPILLARY: 118 mg/dL — AB (ref 70–99)
GLUCOSE-CAPILLARY: 112 mg/dL — AB (ref 70–99)
GLUCOSE-CAPILLARY: 124 mg/dL — AB (ref 70–99)
Glucose-Capillary: 123 mg/dL — ABNORMAL HIGH (ref 70–99)
Glucose-Capillary: 132 mg/dL — ABNORMAL HIGH (ref 70–99)
Glucose-Capillary: 139 mg/dL — ABNORMAL HIGH (ref 70–99)
Glucose-Capillary: 142 mg/dL — ABNORMAL HIGH (ref 70–99)

## 2014-01-23 LAB — CBC WITH DIFFERENTIAL/PLATELET
BASOS ABS: 0 10*3/uL (ref 0.0–0.1)
BASOS PCT: 0 % (ref 0–1)
EOS PCT: 0 % (ref 0–5)
Eosinophils Absolute: 0.1 10*3/uL (ref 0.0–0.7)
HEMATOCRIT: 33.8 % — AB (ref 36.0–46.0)
Hemoglobin: 10.9 g/dL — ABNORMAL LOW (ref 12.0–15.0)
Lymphocytes Relative: 9 % — ABNORMAL LOW (ref 12–46)
Lymphs Abs: 1.9 10*3/uL (ref 0.7–4.0)
MCH: 28.8 pg (ref 26.0–34.0)
MCHC: 32.2 g/dL (ref 30.0–36.0)
MCV: 89.2 fL (ref 78.0–100.0)
MONO ABS: 1.2 10*3/uL — AB (ref 0.1–1.0)
Monocytes Relative: 6 % (ref 3–12)
Neutro Abs: 18.5 10*3/uL — ABNORMAL HIGH (ref 1.7–7.7)
Neutrophils Relative %: 85 % — ABNORMAL HIGH (ref 43–77)
PLATELETS: 238 10*3/uL (ref 150–400)
RBC: 3.79 MIL/uL — ABNORMAL LOW (ref 3.87–5.11)
RDW: 14.7 % (ref 11.5–15.5)
WBC: 21.7 10*3/uL — ABNORMAL HIGH (ref 4.0–10.5)

## 2014-01-23 LAB — URINE CULTURE: Colony Count: >=100000

## 2014-01-23 LAB — PROCALCITONIN: Procalcitonin: 24.4 ng/mL

## 2014-01-23 MED ORDER — CLORAZEPATE DIPOTASSIUM 3.75 MG PO TABS
3.7500 mg | ORAL_TABLET | Freq: Two times a day (BID) | ORAL | Status: DC | PRN
  Administered 2014-01-23 – 2014-01-25 (×2): 3.75 mg via ORAL
  Filled 2014-01-23 (×2): qty 1

## 2014-01-23 MED ORDER — HYDROCODONE-ACETAMINOPHEN 7.5-325 MG PO TABS: 1.0000 | ORAL_TABLET | ORAL | Status: DC | PRN

## 2014-01-23 MED ORDER — INSULIN ASPART 100 UNIT/ML ~~LOC~~ SOLN
0.0000 [IU] | Freq: Three times a day (TID) | SUBCUTANEOUS | Status: DC
  Administered 2014-01-24 – 2014-01-25 (×2): 1 [IU] via SUBCUTANEOUS

## 2014-01-23 MED ORDER — CARVEDILOL 3.125 MG PO TABS
3.1250 mg | ORAL_TABLET | Freq: Two times a day (BID) | ORAL | Status: DC
  Administered 2014-01-23 – 2014-01-25 (×5): 3.125 mg via ORAL
  Filled 2014-01-23 (×7): qty 1

## 2014-01-23 MED ORDER — IPRATROPIUM BROMIDE 0.02 % IN SOLN
0.5000 mg | Freq: Three times a day (TID) | RESPIRATORY_TRACT | Status: DC
  Administered 2014-01-23 – 2014-01-24 (×2): 0.5 mg via RESPIRATORY_TRACT
  Filled 2014-01-23 (×2): qty 2.5

## 2014-01-23 MED ORDER — LEVALBUTEROL HCL 0.63 MG/3ML IN NEBU
0.6300 mg | INHALATION_SOLUTION | Freq: Three times a day (TID) | RESPIRATORY_TRACT | Status: DC
  Administered 2014-01-23 – 2014-01-24 (×2): 0.63 mg via RESPIRATORY_TRACT
  Filled 2014-01-23 (×5): qty 3

## 2014-01-23 NOTE — Progress Notes (Signed)
[facility] PROGRESS NOTE  Roselene Gray Michaux-Mills EVO:[id number] DOB: 27-Feb-1945 DOA: 01/21/2014 PCP: Thressa Sheller, MD  Assessment/Plan:  Principal Problem:   Pyelonephritis: continue rocephin. Urine cx growing gnr. D/c ivf. Wbc, procalcitonin improving Active Problems:   Essential hypertension   DM type 2 (diabetes mellitus, type 2)   Morbid obesity   Chest pain: echo normal WM. EF normal   Sepsis   Hypokalemia corrected   Elevated troponin: outpt stress tes  HPI/Subjective: Feeling better. Per rn, had some wheezing this am. Pt denies dyspnea  Objective: Filed Vitals:   01/23/14 1300  BP: 142/73  Pulse: 108  Temp: 99.9 F (37.7 C)  Resp: 20    Intake/Output Summary (Last 24 hours) at 01/23/14 1757 Last data filed at 01/23/14 1400  Gross per 24 hour  Intake 1128.75 ml  Output      0 ml  Net 1128.75 ml   Filed Weights   01/21/14 2153  Weight: 101.742 kg (224 lb 4.8 oz)    Exam:   General:  In chair. Less sleepy  Cardiovascular: RRR without MGR  Respiratory: CTA without WRR  Abdomen: s, nt, nd obese  Ext: no CCE  Basic Metabolic Panel:  Recent Labs Lab 01/21/14 1533 01/21/14 1551 01/22/14 0105 01/22/14 0652  NA 138 141  --  137  K 3.1* 3.0*  --  4.3  CL 101 100  --  102  CO2 22  --   --  23  GLUCOSE 150* 154*  --  123*  BUN 9 10  --  13  CREATININE 1.23* 1.10 1.20* 1.15*  CALCIUM 10.1  --   --  9.6  MG 0.9*  --   --  1.6  PHOS  --   --   --  2.5   Liver Function Tests:  Recent Labs Lab 01/21/14 1533 01/22/14 0652  AST 57* 33  ALT 30 24  ALKPHOS 119* 94  BILITOT 0.9 0.7  PROT 7.4 6.7  ALBUMIN 3.4* 2.9*    Recent Labs Lab 01/21/14 1533  LIPASE 38   No results for input(s): AMMONIA in the last 168 hours. CBC:  Recent Labs Lab 01/21/14 1533 01/21/14 1551 01/22/14 0105 01/22/14 0652 01/23/14 0720  WBC 31.4*  --  41.0* 36.1* 21.7*  NEUTROABS 29.2*  --   --   --  18.5*  HGB 13.8 16.0* 12.2 12.0 10.9*  HCT 42.1  47.0* 37.7 36.6 33.8*  MCV 89.8  --  88.1 88.4 89.2  PLT 313  --  314 293 238   Cardiac Enzymes:  Recent Labs Lab 01/21/14 1533 01/21/14 1958 01/22/14 0105 01/22/14 0652 01/22/14 1240  TROPONINI 0.04* 0.10* 0.10* 0.10* 0.07*   BNP (last 3 results) No results for input(s): PROBNP in the last 8760 hours. CBG:  Recent Labs Lab 01/23/14 0002 01/23/14 0434 01/23/14 0814 01/23/14 1203 01/23/14 1622  GLUCAP 142* 125* 118* 123* 112*    Recent Results (from the past 240 hour(s))  Urine culture     Status: None (Preliminary result)   Collection Time: 01/21/14  6:27 PM  Result Value Ref Range Status   Specimen Description URINE, CLEAN CATCH  Final   Special Requests NONE  Final   Colony Count   Final    >=100,000 COLONIES/ML Performed at [facility] Performed at [facility]    Report Status PENDING  Incomplete  Culture, blood (routine x 2)  Status: None (Preliminary result)   Collection Time: 01/22/14 12:20 AM  Result Value Ref Range Status   Specimen Description BLOOD LEFT HAND  Final   Special Requests BOTTLES DRAWN AEROBIC AND ANAEROBIC 10CC  Final   Culture   Final           BLOOD CULTURE RECEIVED NO GROWTH TO DATE CULTURE WILL BE HELD FOR 5 DAYS BEFORE ISSUING A FINAL NEGATIVE REPORT Performed at [facility]    Report Status PENDING  Incomplete  Culture, blood (routine x 2)     Status: None (Preliminary result)   Collection Time: 01/22/14 12:30 AM  Result Value Ref Range Status   Specimen Description BLOOD LEFT ANTECUBITAL  Final   Special Requests BOTTLES DRAWN AEROBIC AND ANAEROBIC 10CC  Final   Culture   Final           BLOOD CULTURE RECEIVED NO GROWTH TO DATE CULTURE WILL BE HELD FOR 5 DAYS BEFORE ISSUING A FINAL NEGATIVE REPORT Performed at [facility]    Report Status PENDING  Incomplete     Studies: No results found. Echo Left ventricle: The cavity size was normal.  Wall thickness was increased in a pattern of moderate LVH. There was focal basal hypertrophy. Systolic function was normal. The estimated ejection fraction was in the range of 55% to 60%. Wall motion was normal; there were no regional wall motion abnormalities. Doppler parameters are consistent with abnormal left ventricular relaxation (grade 1 diastolic dysfunction). - Mitral valve: Mildly to moderately calcified annulus.  Scheduled Meds: . aspirin EC  81 mg Oral Daily  . atorvastatin  80 mg Oral q1800  . carvedilol  3.125 mg Oral BID WC  . cefTRIAXone (ROCEPHIN)  IV  1 g Intravenous Q24H  . clorazepate  1.875 mg Oral Q1200  . docusate sodium  100 mg Oral BID  . DULoxetine  60 mg Oral q morning - 10a  . enoxaparin (LOVENOX) injection  40 mg Subcutaneous QHS  . feeding supplement (GLUCERNA SHAKE)  237 mL Oral BID BM  . guaiFENesin  600 mg Oral BID  . insulin aspart  0-9 Units Subcutaneous TID WC  . ipratropium  0.5 mg Nebulization TID  . levalbuterol  0.63 mg Nebulization TID  . losartan  100 mg Oral q morning - 10a  . pantoprazole  40 mg Oral Q1200  . polyethylene glycol  17 g Oral BID  . sodium chloride  3 mL Intravenous Q12H   Continuous Infusions:   Time spent: 25 minutes  [facility] Text page [email or web address], password [facility] 01/23/2014, 5:57 PM  LOS: 2 days

## 2014-01-23 NOTE — Progress Notes (Signed)
Utilization Review Completed.Shannon Lynch T12/29/2015  

## 2014-01-23 NOTE — Evaluation (Signed)
Physical Therapy Evaluation Patient Details Name: Shannon Lynch MRN: [id number] DOB: 1945/02/01 Today's Date: 01/23/2014   History of Present Illness  Patient started to have sudden onset of stubbing chest pain with radiation to the left side of the chest and back at 2 AM today. This was associated with diaphoresis. She attributed it to gas from chilli that she ate. Patient was trying to get up and started to fall due to lightheadedness. Family prevented the  She had generalized weakness but denies any localized symptoms.  They report that patient was confused.  She arrived to ER diaphoretic and short of breath she was noted to be tachycardic. Initially she was evaluated for PE with CTA chest which showed not PE but evidence of pyelonephritis. She does endorse some urinary burning and some hematuria. UA showed evidence of UTI. She denies any fever. Patient is currently chest pain free. She denies hx of CAD. She reports never having a stress test. In emergency department initial troponin was found to be 0.04 for repeat troponin was 0.10  Clinical Impression  Pt currently limited by decreased cardiorespiratory endurance, decreased mobility and generalized weakness.  Pt requires mod A for bed mobility, otherwise is close S for mobility.  Her and husband feel she is close to baseline.  Discussed energy conservation and also building endurance at home as husband states he has to provide a lot of encouragement for her to walk.  HR up to 120 during gait, 109 at rest, SaO2 at 94% on RA prior to gait, dropped to 87% briefly then to 91-92% once back in room.  Pt would benefit from continued PT in acute services to address deficits.  PT recommends HHPT for follow up at home to increase endurance, strengthening and independence.     Follow Up Recommendations Home health PT;Supervision for mobility/OOB    Equipment Recommendations  None recommended by PT    Recommendations for Other Services        Precautions / Restrictions Precautions Precautions: Fall Precaution Comments: L TKR 3 months ago, watch sats/HR Restrictions Weight Bearing Restrictions: No      Mobility  Bed Mobility Overal bed mobility: Needs Assistance Bed Mobility: Supine to Sit     Supine to sit: Mod assist     General bed mobility comments: Pt requires mod A to elevate trunk into sitting with HOB flat and without bed rails.  Note that she states this is more difficult than usual, however feel that hospital bed may be hindering her movement.  Provided cues for assist and pursed lip breathing throughout.   Transfers Overall transfer level: Needs assistance Equipment used: Rolling walker (2 wheeled) Transfers: Sit to/from Stand Sit to Stand: Supervision;Min guard         General transfer comment: Pt able to perform sit<>stand at min/guard initially, then S during second stand with cues for hand placement and safety.   Ambulation/Gait Ambulation/Gait assistance: Supervision;Min guard Ambulation Distance (Feet): 50 Feet Assistive device: Rolling walker (2 wheeled) Gait Pattern/deviations: Step-through pattern;Decreased stride length;Trunk flexed;Wide base of support     General Gait Details: Pt requires min/guard progressing to S level during gait with cues for safety, esp when turning.  Pt states she is pretty close to baseline movement.   Stairs            Wheelchair Mobility    Modified Rankin (Stroke Patients Only)       Balance Overall balance assessment: Needs assistance Sitting-balance support: Feet supported Sitting balance-Leahy Scale: Good  Standing balance support: During functional activity;Bilateral upper extremity supported Standing balance-Leahy Scale: Fair                               Pertinent Vitals/Pain Pain Assessment: 0-10 Pain Score: 0-No pain    Home Living Family/patient expects to be discharged to:: Private residence Living  Arrangements: Spouse/significant other Available Help at Discharge: Family;Available 24 hours/day Type of Home: House Home Access: Stairs to enter Entrance Stairs-Rails: None Entrance Stairs-Number of Steps: 3 STE in front Home Layout: Two level;Able to live on main level with bedroom/bathroom Home Equipment: Other (comment);Shower seat - built in;Walker - 2 wheels      Prior Function Level of Independence: Needs assistance   Gait / Transfers Assistance Needed: Pt not needing physical assist for gait, however husband would provide close S for gait  ADL's / Homemaking Assistance Needed: Pt getting help with lower body dressing, and posterior bathing        Hand Dominance        Extremity/Trunk Assessment               Lower Extremity Assessment: Generalized weakness      Cervical / Trunk Assessment: Kyphotic  Communication   Communication: No difficulties  Cognition Arousal/Alertness: Awake/alert Behavior During Therapy: WFL for tasks assessed/performed Overall Cognitive Status: Within Functional Limits for tasks assessed                      General Comments General comments (skin integrity, edema, etc.): Pt very dyspneic during mobility with cues for pursed lip breathing throughout.  SaO2 dropped to 87% briefly on RA during gait, but with standing rest break and pursed lip breathing, able to increase sats to 92%.      Exercises        Assessment/Plan    PT Assessment Patient needs continued PT services  PT Diagnosis Difficulty walking;Generalized weakness   PT Problem List Decreased strength;Decreased activity tolerance;Decreased balance;Decreased mobility;Decreased knowledge of use of DME;Cardiopulmonary status limiting activity;Obesity  PT Treatment Interventions DME instruction;Gait training;Stair training;Functional mobility training;Therapeutic activities;Therapeutic exercise;Balance training;Patient/family education   PT Goals (Current goals  can be found in the Care Plan section) Acute Rehab PT Goals Patient Stated Goal: to get back home PT Goal Formulation: With patient/family Time For Goal Achievement: 01/30/14 Potential to Achieve Goals: Good    Frequency Min 3X/week   Barriers to discharge        Co-evaluation               End of Session   Activity Tolerance: Patient tolerated treatment well;Patient limited by fatigue Patient left: in chair;with call bell/phone within reach;with family/visitor present Nurse Communication: Mobility status;Other (comment) (HR and Sats)         Time: 2831-5176 PT Time Calculation (min) (ACUTE ONLY): 28 min   Charges:   PT Evaluation $Initial PT Evaluation Tier I: 1 Procedure PT Treatments $Gait Training: 8-22 mins $Therapeutic Activity: 8-22 mins   PT G Codes:        Denice Bors 01/23/2014, 2:05 PM

## 2014-01-23 NOTE — Progress Notes (Signed)
Patient Name: Shannon Lynch Date of Encounter: 01/23/2014  Principal Problem:   Pyelonephritis Active Problems:   Essential hypertension   DM type 2 (diabetes mellitus, type 2)   Morbid obesity   Chest pain   Sepsis   Hypokalemia   Elevated troponin   Pain in the chest   Primary Cardiologist: New  Patient Profile: 68 yo female w/ hx HTN, UTI, DM, L-TKR, no hx CAD, was admitted 12/27 w/ chest pain, pyelonephritis.  SUBJECTIVE: Anxious, but chest pain has improved. Other symptoms are better  OBJECTIVE Filed Vitals:   01/22/14 2200 01/23/14 0417 01/23/14 0436 01/23/14 0518  BP:  112/61    Pulse:  115    Temp:  99.8 F (37.7 C)  99.2 F (37.3 C)  TempSrc:  Oral  Oral  Resp: 21     Height:      Weight:      SpO2:  94% 94%     Intake/Output Summary (Last 24 hours) at 01/23/14 1005 Last data filed at 01/23/14 1062  Gross per 24 hour  Intake 1008.75 ml  Output      0 ml  Net 1008.75 ml   Filed Weights   01/21/14 2153  Weight: 224 lb 4.8 oz (101.742 kg)    PHYSICAL EXAM General: Well developed, well nourished, female in no acute distress. Head: Normocephalic, atraumatic.  Neck: Supple without bruits, JVD not elevated. Lungs:  Resp regular and unlabored, few rales bases. Heart: RRR, S1, S2, no S3, S4, soft murmur; no rub. Abdomen: Soft, non-tender, non-distended, BS + x 4.  Extremities: No clubbing, cyanosis, no edema.  Neuro: Alert and oriented X 3. Moves all extremities spontaneously. Psych: Normal affect.  LABS: CBC: Recent Labs  01/21/14 1533  01/22/14 0652 01/23/14 0720  WBC 31.4*  < > 36.1* 21.7*  NEUTROABS 29.2*  --   --  18.5*  HGB 13.8  < > 12.0 10.9*  HCT 42.1  < > 36.6 33.8*  MCV 89.8  < > 88.4 89.2  PLT 313  < > 293 238  < > = values in this interval not displayed. INR:No results for input(s): INR in the last 72 hours. Basic Metabolic Panel:  Recent Labs  01/21/14 1533 01/21/14 1551 01/22/14 0105 01/22/14 0652  NA  138 141  --  137  K 3.1* 3.0*  --  4.3  CL 101 100  --  102  CO2 22  --   --  23  GLUCOSE 150* 154*  --  123*  BUN 9 10  --  13  CREATININE 1.23* 1.10 1.20* 1.15*  CALCIUM 10.1  --   --  9.6  MG 0.9*  --   --  1.6  PHOS  --   --   --  2.5   Liver Function Tests:  Recent Labs  01/21/14 1533 01/22/14 0652  AST 57* 33  ALT 30 24  ALKPHOS 119* 94  BILITOT 0.9 0.7  PROT 7.4 6.7  ALBUMIN 3.4* 2.9*   Cardiac Enzymes:  Recent Labs  01/22/14 0105 01/22/14 0652 01/22/14 1240  TROPONINI 0.10* 0.10* 0.07*    Recent Labs  01/21/14 1549  TROPIPOC 0.02   Hemoglobin A1C:  Recent Labs  01/22/14 0105  HGBA1C 6.9*   Thyroid Function Tests:  Recent Labs  01/22/14 0652  TSH 2.653   TELE:  SR,     ECHO: 01/22/2014  Study Conclusions - Left ventricle: The cavity size was normal. Wall thickness was  increased in a pattern of moderate LVH. There was focal basal hypertrophy. Systolic function was normal. The estimated ejection fraction was in the range of 55% to 60%. Wall motion was normal; there were no regional wall motion abnormalities. Doppler parameters are consistent with abnormal left ventricular relaxation (grade 1 diastolic dysfunction). - Mitral valve: Mildly to moderately calcified annulus.  Radiology/Studies: Ct Angio Chest Pe W/cm &/or Wo Cm  01/21/2014   CLINICAL DATA:  Chest pain, chronic shortness of breath with worsening in last 2 days, history of COPD  EXAM: CT ANGIOGRAPHY CHEST WITH CONTRAST  TECHNIQUE: Multidetector CT imaging of the chest was performed using the standard protocol during bolus administration of intravenous contrast. Multiplanar CT image reconstructions and MIPs were obtained to evaluate the vascular anatomy.  CONTRAST:  170mL OMNIPAQUE IOHEXOL 350 MG/ML SOLN  COMPARISON:  None.  FINDINGS: There are streaky artifacts from patient's large body habitus. Degenerative changes are noted right shoulder.  Sagittal images of the spine  shows multilevel degenerative changes thoracic spine. Sagittal view of the sternum is unremarkable.  Images of the thoracic inlet are unremarkable. Central airways are patent. Atherosclerotic calcifications of thoracic aorta are noted. Mild atherosclerotic calcifications of coronary arteries. No aortic aneurysm. No hilar or mediastinal adenopathy.  The study is of excellent technical quality. No pulmonary embolus is noted.  Images of the lung parenchyma shows no pulmonary edema. No segmental infiltrate. Mild atelectasis and minimal pleural thickening noted bilateral lower lobe posteriorly. No rib fractures are noted.  There is mild hepatic fatty infiltration.  The visualized upper abdomen shows global decreased enhancement upper pole of the left kidney with loss of cortico medullary differentiation. Diffuse inflammatory process mass or infection cannot be excluded. Clinical correlation is necessary. Follow-up nonemergent enhanced study is recommended for further correlation.  Review of the MIP images confirms the above findings.  IMPRESSION: 1. No pulmonary embolus is noted. 2. No aortic aneurysm. No acute infiltrate or pulmonary edema. Mild dependent atelectasis and pleural thickening lung bases posteriorly. 3. Atherosclerotic calcifications of thoracic aorta and mild coronary artery calcifications. 4. Degenerative changes thoracic spine. 5. Abnormal decreased enhancement with ill-defined contour and loss of normal cortical medullary differentiation upper pole of the left kidney. Inflammatory process, mass or infection cannot be excluded. Clinical correlation is necessary. Follow-up enhanced study is recommended as clinically warranted.   Electronically Signed   By: Lahoma Crocker M.D.   On: 01/21/2014 17:41   Dg Chest Portable 1 View  01/21/2014   CLINICAL DATA:  Left-sided chest pain.  EXAM: PORTABLE CHEST - 1 VIEW  COMPARISON:  PA and lateral chest 09/19/2013.  FINDINGS: Lung volumes are low but the lungs are  clear. Heart size is normal. No pneumothorax or pleural effusion. Remote surgical neck fracture right humerus is noted.  IMPRESSION: No acute disease.   Electronically Signed   By: Inge Rise M.D.   On: 01/21/2014 15:56     Current Medications:  . aspirin EC  81 mg Oral Daily  . atorvastatin  80 mg Oral q1800  . carvedilol  3.125 mg Oral BID WC  . cefTRIAXone (ROCEPHIN)  IV  1 g Intravenous Q24H  . clorazepate  1.875 mg Oral Q1200  . docusate sodium  100 mg Oral BID  . DULoxetine  60 mg Oral q morning - 10a  . enoxaparin (LOVENOX) injection  40 mg Subcutaneous QHS  . feeding supplement (GLUCERNA SHAKE)  237 mL Oral BID BM  . guaiFENesin  600 mg Oral BID  .  insulin aspart  0-9 Units Subcutaneous 6 times per day  . losartan  100 mg Oral q morning - 10a  . pantoprazole  40 mg Oral Q1200  . polyethylene glycol  17 g Oral BID  . sodium chloride  3 mL Intravenous Q12H   . sodium chloride 75 mL/hr at 01/23/14 1700    ASSESSMENT AND PLAN: Principal Problem:   Pyelonephritis - per IM, WBCs trending down  Active Problems:   Essential hypertension - per IM, SBP range 103-133 on current rx    DM type 2 (diabetes mellitus, type 2) - per IM    Morbid obesity - per IM    Chest pain -  atypical and has resolved, minimal troponin elevation (no CKMB performed) with no significant crescendo/decrescendo pattern. EF normal on echo. Stress test recommended as OP. She is on ASA, statin, ARB, low-dose Coreg.    Sepsis - Improving, per IM    Hypokalemia - Per IM, supplemented and improved    Elevated troponin - see above, felt demand ischemia    Pain in the chest - see above  No further cardiac workup as inpt, have sent message to office and ordered stress test.  Signed, Rosaria Ferries , PA-C 10:05 AM 01/23/2014  Personally seen and examined. Agree with above. Will sign off.   Candee Furbish, MD

## 2014-01-24 ENCOUNTER — Inpatient Hospital Stay (HOSPITAL_COMMUNITY): Payer: Medicare Other

## 2014-01-24 DIAGNOSIS — R0600 Dyspnea, unspecified: Secondary | ICD-10-CM | POA: Clinically undetermined

## 2014-01-24 LAB — CBC WITH DIFFERENTIAL/PLATELET
Basophils Absolute: 0 10*3/uL (ref 0.0–0.1)
Basophils Relative: 0 % (ref 0–1)
EOS ABS: 0.4 10*3/uL (ref 0.0–0.7)
EOS PCT: 3 % (ref 0–5)
HCT: 33.2 % — ABNORMAL LOW (ref 36.0–46.0)
Hemoglobin: 10.6 g/dL — ABNORMAL LOW (ref 12.0–15.0)
LYMPHS ABS: 1.8 10*3/uL (ref 0.7–4.0)
Lymphocytes Relative: 13 % (ref 12–46)
MCH: 28.5 pg (ref 26.0–34.0)
MCHC: 31.9 g/dL (ref 30.0–36.0)
MCV: 89.2 fL (ref 78.0–100.0)
Monocytes Absolute: 1.1 10*3/uL — ABNORMAL HIGH (ref 0.1–1.0)
Monocytes Relative: 9 % (ref 3–12)
NEUTROS PCT: 75 % (ref 43–77)
Neutro Abs: 10 10*3/uL — ABNORMAL HIGH (ref 1.7–7.7)
Platelets: 243 10*3/uL (ref 150–400)
RBC: 3.72 MIL/uL — AB (ref 3.87–5.11)
RDW: 14.6 % (ref 11.5–15.5)
WBC: 13.3 10*3/uL — ABNORMAL HIGH (ref 4.0–10.5)

## 2014-01-24 LAB — GLUCOSE, CAPILLARY
GLUCOSE-CAPILLARY: 109 mg/dL — AB (ref 70–99)
GLUCOSE-CAPILLARY: 130 mg/dL — AB (ref 70–99)
Glucose-Capillary: 109 mg/dL — ABNORMAL HIGH (ref 70–99)
Glucose-Capillary: 99 mg/dL (ref 70–99)

## 2014-01-24 LAB — BRAIN NATRIURETIC PEPTIDE: B Natriuretic Peptide: 192.3 pg/mL — ABNORMAL HIGH (ref 0.0–100.0)

## 2014-01-24 MED ORDER — CEFUROXIME AXETIL 500 MG PO TABS
500.0000 mg | ORAL_TABLET | Freq: Two times a day (BID) | ORAL | Status: DC
  Administered 2014-01-25: 500 mg via ORAL
  Filled 2014-01-24 (×3): qty 1

## 2014-01-24 MED ORDER — LEVALBUTEROL HCL 0.63 MG/3ML IN NEBU
0.6300 mg | INHALATION_SOLUTION | Freq: Four times a day (QID) | RESPIRATORY_TRACT | Status: DC
  Administered 2014-01-24 – 2014-01-25 (×4): 0.63 mg via RESPIRATORY_TRACT
  Filled 2014-01-24 (×9): qty 3

## 2014-01-24 MED ORDER — AZITHROMYCIN 500 MG PO TABS
500.0000 mg | ORAL_TABLET | Freq: Every day | ORAL | Status: DC
  Administered 2014-01-24 – 2014-01-25 (×2): 500 mg via ORAL
  Filled 2014-01-24 (×2): qty 1

## 2014-01-24 MED ORDER — FUROSEMIDE 10 MG/ML IJ SOLN
40.0000 mg | Freq: Once | INTRAMUSCULAR | Status: AC
  Administered 2014-01-24: 40 mg via INTRAVENOUS
  Filled 2014-01-24: qty 4

## 2014-01-24 MED ORDER — CEPHALEXIN 500 MG PO CAPS
500.0000 mg | ORAL_CAPSULE | Freq: Two times a day (BID) | ORAL | Status: DC
  Administered 2014-01-24: 500 mg via ORAL
  Filled 2014-01-24 (×2): qty 1

## 2014-01-24 NOTE — Progress Notes (Signed)
Pt ambulated with walker 45ft, some dyspnea noted halfway through walk, pt took several breaks to catch her breath. Sherrie Mustache 5:35 PM

## 2014-01-24 NOTE — Progress Notes (Signed)
[facility] PROGRESS NOTE  Shannon Lynch YTK:[id number] DOB: 1945/03/24 DOA: 01/21/2014 PCP: Thressa Sheller, MD  Assessment/Plan:  Principal Problem:   Pyelonephritis: e coli sensitive to most. Change to cephalexin. Active Problems: Dyspnea: wheezing. Schedule nebs. Give a dose of lasix. Check CXR. May need steroids. If no improvement Copd   Essential hypertension   DM type 2 (diabetes mellitus, type 2)   Morbid obesity   Chest pain: echo normal WM. EF normal   Sepsis   Hypokalemia corrected   Elevated troponin: outpt stress tes   HPI/Subjective: Feels dyspneic. Cough. Wheezing.  Objective: Filed Vitals:   01/24/14 0920  BP:   Pulse: 90  Temp:   Resp: 20    Intake/Output Summary (Last 24 hours) at 01/24/14 1006 Last data filed at 01/24/14 0241  Gross per 24 hour  Intake    360 ml  Output    600 ml  Net   -240 ml   Filed Weights   01/21/14 2153  Weight: 101.742 kg (224 lb 4.8 oz)    Exam:   General:  Uncomfortable appearing  Cardiovascular: RRR without MGR  Respiratory: diminished. No WRR  Abdomen: s, nt, nd obese  Ext: no CCE  Basic Metabolic Panel:  Recent Labs Lab 01/21/14 1533 01/21/14 1551 01/22/14 0105 01/22/14 0652  NA 138 141  --  137  K 3.1* 3.0*  --  4.3  CL 101 100  --  102  CO2 22  --   --  23  GLUCOSE 150* 154*  --  123*  BUN 9 10  --  13  CREATININE 1.23* 1.10 1.20* 1.15*  CALCIUM 10.1  --   --  9.6  MG 0.9*  --   --  1.6  PHOS  --   --   --  2.5   Liver Function Tests:  Recent Labs Lab 01/21/14 1533 01/22/14 0652  AST 57* 33  ALT 30 24  ALKPHOS 119* 94  BILITOT 0.9 0.7  PROT 7.4 6.7  ALBUMIN 3.4* 2.9*    Recent Labs Lab 01/21/14 1533  LIPASE 38   No results for input(s): AMMONIA in the last 168 hours. CBC:  Recent Labs Lab 01/21/14 1533 01/21/14 1551 01/22/14 0105 01/22/14 0652 01/23/14 0720  WBC 31.4*  --  41.0* 36.1* 21.7*  NEUTROABS 29.2*  --   --   --  18.5*  HGB 13.8 16.0*  12.2 12.0 10.9*  HCT 42.1 47.0* 37.7 36.6 33.8*  MCV 89.8  --  88.1 88.4 89.2  PLT 313  --  314 293 238   Cardiac Enzymes:  Recent Labs Lab 01/21/14 1533 01/21/14 1958 01/22/14 0105 01/22/14 0652 01/22/14 1240  TROPONINI 0.04* 0.10* 0.10* 0.10* 0.07*   BNP (last 3 results) No results for input(s): PROBNP in the last 8760 hours. CBG:  Recent Labs Lab 01/23/14 0814 01/23/14 1203 01/23/14 1622 01/23/14 2150 01/24/14 0558  GLUCAP 118* 123* 112* 124* 109*    Recent Results (from the past 240 hour(s))  Urine culture     Status: None   Collection Time: 01/21/14  6:27 PM  Result Value Ref Range Status   Specimen Description URINE, CLEAN CATCH  Final   Special Requests NONE  Final   Colony Count   Final    >=100,000 COLONIES/ML Performed at [facility]    Culture   Final    ESCHERICHIA COLI Performed at [facility]    Report Status 01/23/2014 FINAL  Final   Organism ID,  Bacteria ESCHERICHIA COLI  Final      Susceptibility   Escherichia coli - MIC*    AMPICILLIN >=32 RESISTANT Resistant     CEFAZOLIN <=4 SENSITIVE Sensitive     CEFTRIAXONE <=1 SENSITIVE Sensitive     CIPROFLOXACIN <=0.25 SENSITIVE Sensitive     GENTAMICIN <=1 SENSITIVE Sensitive     LEVOFLOXACIN <=0.12 SENSITIVE Sensitive     NITROFURANTOIN <=16 SENSITIVE Sensitive     TOBRAMYCIN <=1 SENSITIVE Sensitive     TRIMETH/SULFA >=320 RESISTANT Resistant     PIP/TAZO <=4 SENSITIVE Sensitive     * ESCHERICHIA COLI  Culture, blood (routine x 2)     Status: None (Preliminary result)   Collection Time: 01/22/14 12:20 AM  Result Value Ref Range Status   Specimen Description BLOOD LEFT HAND  Final   Special Requests BOTTLES DRAWN AEROBIC AND ANAEROBIC 10CC  Final   Culture   Final           BLOOD CULTURE RECEIVED NO GROWTH TO DATE CULTURE WILL BE HELD FOR 5 DAYS BEFORE ISSUING A FINAL NEGATIVE REPORT Performed at [facility]    Report Status PENDING  Incomplete  Culture, blood  (routine x 2)     Status: None (Preliminary result)   Collection Time: 01/22/14 12:30 AM  Result Value Ref Range Status   Specimen Description BLOOD LEFT ANTECUBITAL  Final   Special Requests BOTTLES DRAWN AEROBIC AND ANAEROBIC 10CC  Final   Culture   Final           BLOOD CULTURE RECEIVED NO GROWTH TO DATE CULTURE WILL BE HELD FOR 5 DAYS BEFORE ISSUING A FINAL NEGATIVE REPORT Performed at [facility]    Report Status PENDING  Incomplete     Studies: No results found. Echo Left ventricle: The cavity size was normal. Wall thickness was increased in a pattern of moderate LVH. There was focal basal hypertrophy. Systolic function was normal. The estimated ejection fraction was in the range of 55% to 60%. Wall motion was normal; there were no regional wall motion abnormalities. Doppler parameters are consistent with abnormal left ventricular relaxation (grade 1 diastolic dysfunction). - Mitral valve: Mildly to moderately calcified annulus.  Scheduled Meds: . aspirin EC  81 mg Oral Daily  . atorvastatin  80 mg Oral q1800  . carvedilol  3.125 mg Oral BID WC  . cephALEXin  500 mg Oral Q12H  . docusate sodium  100 mg Oral BID  . DULoxetine  60 mg Oral q morning - 10a  . enoxaparin (LOVENOX) injection  40 mg Subcutaneous QHS  . feeding supplement (GLUCERNA SHAKE)  237 mL Oral BID BM  . guaiFENesin  600 mg Oral BID  . insulin aspart  0-9 Units Subcutaneous TID WC  . ipratropium  0.5 mg Nebulization TID  . levalbuterol  0.63 mg Nebulization QID  . losartan  100 mg Oral q morning - 10a  . pantoprazole  40 mg Oral Q1200  . polyethylene glycol  17 g Oral BID  . sodium chloride  3 mL Intravenous Q12H   Continuous Infusions:   Time spent: 25 minutes  [facility] [email or web address], password [email or web address] 01/24/2014, 10:06 AM  LOS: 3 days

## 2014-01-25 LAB — BASIC METABOLIC PANEL
Anion gap: 8 (ref 5–15)
BUN: 12 mg/dL (ref 6–23)
CO2: 29 mmol/L (ref 19–32)
Calcium: 9.5 mg/dL (ref 8.4–10.5)
Chloride: 97 mEq/L (ref 96–112)
Creatinine, Ser: 0.89 mg/dL (ref 0.50–1.10)
GFR calc Af Amer: 75 mL/min — ABNORMAL LOW (ref >90)
GFR calc non Af Amer: 65 mL/min — ABNORMAL LOW (ref >90)
GLUCOSE: 113 mg/dL — AB (ref 70–99)
POTASSIUM: 3.1 mmol/L — AB (ref 3.5–5.1)
Sodium: 134 mmol/L — ABNORMAL LOW (ref 135–145)

## 2014-01-25 LAB — GLUCOSE, CAPILLARY
GLUCOSE-CAPILLARY: 96 mg/dL (ref 70–99)
Glucose-Capillary: 142 mg/dL — ABNORMAL HIGH (ref 70–99)

## 2014-01-25 MED ORDER — CARVEDILOL 3.125 MG PO TABS: 3.1250 mg | ORAL_TABLET | Freq: Two times a day (BID) | ORAL | Status: DC

## 2014-01-25 MED ORDER — CEFUROXIME AXETIL 500 MG PO TABS: 500.0000 mg | ORAL_TABLET | Freq: Two times a day (BID) | ORAL | Status: DC

## 2014-01-25 MED ORDER — AZITHROMYCIN 500 MG PO TABS: 500.0000 mg | ORAL_TABLET | Freq: Every day | ORAL | Status: DC

## 2014-01-25 MED ORDER — POTASSIUM CHLORIDE CRYS ER 20 MEQ PO TBCR
40.0000 meq | EXTENDED_RELEASE_TABLET | ORAL | Status: AC
  Administered 2014-01-25 (×3): 40 meq via ORAL
  Filled 2014-01-25 (×3): qty 2

## 2014-01-25 NOTE — Progress Notes (Signed)
PT Cancellation Note  Patient Details Name: Shannon Lynch MRN: [id number] DOB: January 07, 1946   Cancelled Treatment:    Reason Eval/Treat Not Completed: Fatigue/lethargy limiting ability to participate;Other (comment) (States she has been swamped with things to do with testing ).  Too tired to try to walk.  Declined other elements of therapy.   Ramond Dial 01/25/2014, 10:19 AM   Mee Hives, PT [facility] Acute Rehab Dept. Number: [phone]

## 2014-01-25 NOTE — Progress Notes (Signed)
Medicare Important Message given? YES  (If response is "NO", the following Medicare IM given date fields will be blank)  Date Medicare IM given: 01/25/14 Medicare IM given by:  Yoni Lobos  

## 2014-01-25 NOTE — Discharge Summary (Signed)
Physician Discharge Summary  Shannon Lynch WNU:[id number] DOB: 1945-10-11 DOA: 01/21/2014  PCP: Thressa Sheller, MD  Admit date: 01/21/2014 Discharge date: 01/25/2014  Time spent: greater than 30 minutes  Recommendations for Outpatient Follow-up:  1. Consider outpatient polysomnogram 2. Cardiology to arrange outpatient myoview  Discharge Diagnoses:  Principal Problem:   Pyelonephritis secondary to e coli Active Problems:   Essential hypertension   DM type 2 (diabetes mellitus, type 2)   Morbid obesity   Chest pain   Sepsis   Hypokalemia   Elevated troponin Probable community acquired pneumonia   Discharge Condition: stable  Filed Weights   01/21/14 2153 01/24/14 1100 01/25/14 0558  Weight: 101.742 kg (224 lb 4.8 oz) 101.8 kg (224 lb 6.9 oz) 100.562 kg (221 lb 11.2 oz)    History of present illness:  68 y.o. female   has a past medical history of Hypertension; Exogenous obesity; Arthritis; Anxiety; Depression; Diabetes mellitus without complication; Chronic UTI (urinary tract infection); Osteonecrosis; and Shortness of breath.   Presented with  Patient started to have sudden onset of stabbing chest pain with radiation to the left side of the chest and back at 2 AM today. This was associated with diaphoresis. She attributed it to gas from chilli that she ate. Patient was trying to get up and started to fall due to lightheadedness. Family prevented the She had generalized weakness but denies any localized symptoms. They report that patient was confused. She arrived to ER diaphoretic and short of breath she was noted to be tachycardic. Initially she was evaluated for PE with CTA chest which showed not PE but evidence of pyelonephritis. She does endorse some urinary burning, flank pain and some hematuria. UA showed evidence of UTI. She denies any fever. Patient is currently chest pain free. She denies hx of CAD.  In emergency department initial troponin was found to be  0.04 for repeat troponin was 0.10.   EKG without ischemic changes.    Hospitalist was called for admission for Sepsis due to pyelonephritis and elevated troponin vs NSTEMI  Hospital Course:  Admitted to telemetry.  Cardiology consulted.   Pyelonephritis: fluid rescussitated.  Likely early sepsis. Started on Rocephin. Blood cultures negative. Urine culture grew e coli sensitive to most.   Chest pain: echo normal WM. EF normal.  Cardiology did not feel this was ACS and defers stress test as outpatient due to active infection.  Dyspnea: Patient developed wheezing, cough and dyspnea during hospitalization. Has h/o copd. Started on nebulizer treatments. Pro BNP only 192.  CXR showed possible infiltrate, so azithromycin added to regimen for possible CAP. By discharge breathing easier and able to ambulate without hypoxia    Hypokalemia corrected  Patient's husband voiced concerns about daytime somnolence at home.  I recommended patient try decreasing tranxene dose/frequency.  She is not receptive to this idea.  Given morbid obesity, at risk for OSA, and I recommend outpatient polysomnogram. Patient seems noncommital about this as well.   Procedures:  none  Consultations:  cardiology  Discharge Exam: Filed Vitals:   01/25/14 0642  BP: 145/67  Pulse: 82  Temp:   Resp:     General: a and o. comfortable Cardiovascular: RRR without MGR Respiratory: diminished throughout without WRR. Breathing nonlabored. Ext no CCE  Discharge Instructions   Discharge Instructions    Diet - low sodium heart healthy    Complete by:  As directed      Diet Carb Modified    Complete by:  As directed  Increase activity slowly    Complete by:  As directed           Current Discharge Medication List    START taking these medications   Details  azithromycin (ZITHROMAX) 500 MG tablet Take 1 tablet (500 mg total) by mouth daily. Qty: 4 tablet, Refills: 0    carvedilol (COREG) 3.125 MG  tablet Take 1 tablet (3.125 mg total) by mouth 2 (two) times daily with a meal. Qty: 60 tablet, Refills: 1    cefUROXime (CEFTIN) 500 MG tablet Take 1 tablet (500 mg total) by mouth 2 (two) times daily with a meal. Qty: 7 tablet, Refills: 0      CONTINUE these medications which have NOT CHANGED   Details  acetaminophen (TYLENOL) 325 MG tablet Take 2 tablets (650 mg total) by mouth every 6 (six) hours as needed for mild pain (or Fever >/= 101).    cholecalciferol (VITAMIN D) 1000 UNITS tablet Take 1,000 Units by mouth daily.    clorazepate (TRANXENE) 3.75 MG tablet Take 1.875-3.375 mg by mouth 3 (three) times daily. Takes 1 tablet in the morning, 0.5 tablet at lunch and 1 in the evening.    docusate sodium 100 MG CAPS Take 100 mg by mouth 2 (two) times daily. Qty: 10 capsule, Refills: 0    losartan (COZAAR) 100 MG tablet Take 100 mg by mouth every morning.    metFORMIN (GLUCOPHAGE-XR) 750 MG 24 hr tablet Take 1,500 mg by mouth daily with breakfast.    polyethylene glycol (MIRALAX / GLYCOLAX) packet Take 17 g by mouth 2 (two) times daily. Qty: 14 each, Refills: 0    polyvinyl alcohol (LIQUIFILM TEARS) 1.4 % ophthalmic solution Place 1 drop into both eyes 3 (three) times daily as needed for dry eyes.    pseudoephedrine-acetaminophen (TYLENOL SINUS) 30-500 MG TABS Take 1 tablet by mouth every 4 (four) hours as needed (Sinus).    vitamin C (ASCORBIC ACID) 500 MG tablet Take 500 mg by mouth 2 (two) times daily.      STOP taking these medications     Cranberry-Cholecalciferol (SUPER CRANBERRY/VITAMIN D3) 4200-500 MG-UNIT CAPS      DULoxetine (CYMBALTA) 60 MG capsule      HYDROcodone-acetaminophen (NORCO) 7.5-325 MG per tablet      methocarbamol (ROBAXIN) 500 MG tablet        Allergies  Allergen Reactions  . Ciprofloxacin Hives  . Codeine Nausea And Vomiting  . Prednisone     Elevated glucose level to 700   Follow-up Information    Follow up with Candee Furbish, MD.    Specialty:  Cardiology   Why:  Stress test and followup appointment, the office will call.   Contact information:   [address] [address] [phone]       Follow up with Thressa Sheller, MD.   Specialty:  Internal Medicine   Why:  consider sleep study   Contact information:   [address] [place] [address] [phone]        The results of significant diagnostics from this hospitalization (including imaging, microbiology, ancillary and laboratory) are listed below for reference.    Significant Diagnostic Studies: Dg Chest 2 View  01/24/2014   CLINICAL DATA:  Cough and wheeze.  Shortness breath.  EXAM: CHEST  2 VIEW  COMPARISON:  None.  FINDINGS: The heart size is stable. Mild pulmonary vascular congestion is now evident. Bibasilar airspace disease is worse on the left. Degenerative changes of  the thoracolumbar spine are stable. No other significant airspace consolidation is evident. Posttraumatic changes of the right shoulder are again noted.  IMPRESSION: 1. New moderate pulmonary vascular congestion. 2. Bibasilar airspace disease is asymmetric on the left. While this may represent atelectasis, early infection is also considered.   Electronically Signed   By: Lawrence Santiago M.D.   On: 01/24/2014 11:14   Ct Angio Chest Pe W/cm &/or Wo Cm  01/21/2014   CLINICAL DATA:  Chest pain, chronic shortness of breath with worsening in last 2 days, history of COPD  EXAM: CT ANGIOGRAPHY CHEST WITH CONTRAST  TECHNIQUE: Multidetector CT imaging of the chest was performed using the standard protocol during bolus administration of intravenous contrast. Multiplanar CT image reconstructions and MIPs were obtained to evaluate the vascular anatomy.  CONTRAST:  174mL OMNIPAQUE IOHEXOL 350 MG/ML SOLN  COMPARISON:  None.  FINDINGS: There are streaky artifacts from patient's large body habitus. Degenerative changes are noted right shoulder.  Sagittal images  of the spine shows multilevel degenerative changes thoracic spine. Sagittal view of the sternum is unremarkable.  Images of the thoracic inlet are unremarkable. Central airways are patent. Atherosclerotic calcifications of thoracic aorta are noted. Mild atherosclerotic calcifications of coronary arteries. No aortic aneurysm. No hilar or mediastinal adenopathy.  The study is of excellent technical quality. No pulmonary embolus is noted.  Images of the lung parenchyma shows no pulmonary edema. No segmental infiltrate. Mild atelectasis and minimal pleural thickening noted bilateral lower lobe posteriorly. No rib fractures are noted.  There is mild hepatic fatty infiltration.  The visualized upper abdomen shows global decreased enhancement upper pole of the left kidney with loss of cortico medullary differentiation. Diffuse inflammatory process mass or infection cannot be excluded. Clinical correlation is necessary. Follow-up nonemergent enhanced study is recommended for further correlation.  Review of the MIP images confirms the above findings.  IMPRESSION: 1. No pulmonary embolus is noted. 2. No aortic aneurysm. No acute infiltrate or pulmonary edema. Mild dependent atelectasis and pleural thickening lung bases posteriorly. 3. Atherosclerotic calcifications of thoracic aorta and mild coronary artery calcifications. 4. Degenerative changes thoracic spine. 5. Abnormal decreased enhancement with ill-defined contour and loss of normal cortical medullary differentiation upper pole of the left kidney. Inflammatory process, mass or infection cannot be excluded. Clinical correlation is necessary. Follow-up enhanced study is recommended as clinically warranted.   Electronically Signed   By: Lahoma Crocker M.D.   On: 01/21/2014 17:41   Dg Chest Portable 1 View  01/21/2014   CLINICAL DATA:  Left-sided chest pain.  EXAM: PORTABLE CHEST - 1 VIEW  COMPARISON:  PA and lateral chest 09/19/2013.  FINDINGS: Lung volumes are low but the  lungs are clear. Heart size is normal. No pneumothorax or pleural effusion. Remote surgical neck fracture right humerus is noted.  IMPRESSION: No acute disease.   Electronically Signed   By: Inge Rise M.D.   On: 01/21/2014 15:56   Echo Left ventricle: The cavity size was normal. Wall thickness was increased in a pattern of moderate LVH. There was focal basal hypertrophy. Systolic function was normal. The estimated ejection fraction was in the range of 55% to 60%. Wall motion was normal; there were no regional wall motion abnormalities. Doppler parameters are consistent with abnormal left ventricular relaxation (grade 1 diastolic dysfunction). - Mitral valve: Mildly to moderately calcified annulus.  EKG Sinus tachycardia Ventricular premature complex Borderline left axis deviation Anterior infarct, old  Microbiology: Recent Results (from the past 240 hour(s))  Urine  culture     Status: None   Collection Time: 01/21/14  6:27 PM  Result Value Ref Range Status   Specimen Description URINE, CLEAN CATCH  Final   Special Requests NONE  Final   Colony Count   Final    >=100,000 COLONIES/ML Performed at [facility]    Culture   Final    ESCHERICHIA COLI Performed at [facility]    Report Status 01/23/2014 FINAL  Final   Organism ID, Bacteria ESCHERICHIA COLI  Final      Susceptibility   Escherichia coli - MIC*    AMPICILLIN >=32 RESISTANT Resistant     CEFAZOLIN <=4 SENSITIVE Sensitive     CEFTRIAXONE <=1 SENSITIVE Sensitive     CIPROFLOXACIN <=0.25 SENSITIVE Sensitive     GENTAMICIN <=1 SENSITIVE Sensitive     LEVOFLOXACIN <=0.12 SENSITIVE Sensitive     NITROFURANTOIN <=16 SENSITIVE Sensitive     TOBRAMYCIN <=1 SENSITIVE Sensitive     TRIMETH/SULFA >=320 RESISTANT Resistant     PIP/TAZO <=4 SENSITIVE Sensitive     * ESCHERICHIA COLI  Culture, blood (routine x 2)     Status: None (Preliminary result)   Collection Time: 01/22/14 12:20 AM   Result Value Ref Range Status   Specimen Description BLOOD LEFT HAND  Final   Special Requests BOTTLES DRAWN AEROBIC AND ANAEROBIC 10CC  Final   Culture   Final           BLOOD CULTURE RECEIVED NO GROWTH TO DATE CULTURE WILL BE HELD FOR 5 DAYS BEFORE ISSUING A FINAL NEGATIVE REPORT Performed at [facility]    Report Status PENDING  Incomplete  Culture, blood (routine x 2)     Status: None (Preliminary result)   Collection Time: 01/22/14 12:30 AM  Result Value Ref Range Status   Specimen Description BLOOD LEFT ANTECUBITAL  Final   Special Requests BOTTLES DRAWN AEROBIC AND ANAEROBIC 10CC  Final   Culture   Final           BLOOD CULTURE RECEIVED NO GROWTH TO DATE CULTURE WILL BE HELD FOR 5 DAYS BEFORE ISSUING A FINAL NEGATIVE REPORT Performed at [facility]    Report Status PENDING  Incomplete     Labs: Basic Metabolic Panel:  Recent Labs Lab 01/21/14 1533 01/21/14 1551 01/22/14 0105 01/22/14 0652 01/25/14 0314  NA 138 141  --  137 134*  K 3.1* 3.0*  --  4.3 3.1*  CL 101 100  --  102 97  CO2 22  --   --  23 29  GLUCOSE 150* 154*  --  123* 113*  BUN 9 10  --  13 12  CREATININE 1.23* 1.10 1.20* 1.15* 0.89  CALCIUM 10.1  --   --  9.6 9.5  MG 0.9*  --   --  1.6  --   PHOS  --   --   --  2.5  --    Liver Function Tests:  Recent Labs Lab 01/21/14 1533 01/22/14 0652  AST 57* 33  ALT 30 24  ALKPHOS 119* 94  BILITOT 0.9 0.7  PROT 7.4 6.7  ALBUMIN 3.4* 2.9*    Recent Labs Lab 01/21/14 1533  LIPASE 38   No results for input(s): AMMONIA in the last 168 hours. CBC:  Recent Labs Lab 01/21/14 1533 01/21/14 1551 01/22/14 0105 01/22/14 0652 01/23/14 0720 01/24/14 1156  WBC 31.4*  --  41.0* 36.1* 21.7* 13.3*  NEUTROABS 29.2*  --   --   --  18.5* 10.0*  HGB 13.8 16.0* 12.2 12.0 10.9* 10.6*  HCT 42.1 47.0* 37.7 36.6 33.8* 33.2*  MCV 89.8  --  88.1 88.4 89.2 89.2  PLT 313  --  314 293 238 243   Cardiac Enzymes:  Recent Labs Lab  01/21/14 1533 01/21/14 1958 01/22/14 0105 01/22/14 0652 01/22/14 1240  TROPONINI 0.04* 0.10* 0.10* 0.10* 0.07*   BNP: BNP (last 3 results) No results for input(s): PROBNP in the last 8760 hours. CBG:  Recent Labs Lab 01/24/14 1140 01/24/14 1630 01/24/14 2116 01/25/14 0607 01/25/14 1114  GLUCAP 109* 130* 99 96 142*       Signed:  Cosmo Tetreault L  [facility] 01/25/2014, 1:22 PM

## 2014-01-28 LAB — CULTURE, BLOOD (ROUTINE X 2)
CULTURE: NO GROWTH
Culture: NO GROWTH

## 2014-02-07 ENCOUNTER — Encounter (HOSPITAL_COMMUNITY): Payer: No Typology Code available for payment source

## 2014-02-07 ENCOUNTER — Encounter (HOSPITAL_COMMUNITY): Payer: Self-pay

## 2014-02-14 ENCOUNTER — Encounter (HOSPITAL_COMMUNITY): Payer: Medicare Other | Attending: Physician Assistant | Admitting: Radiology

## 2014-02-14 DIAGNOSIS — R7989 Other specified abnormal findings of blood chemistry: Secondary | ICD-10-CM | POA: Diagnosis not present

## 2014-02-14 DIAGNOSIS — R072 Precordial pain: Secondary | ICD-10-CM

## 2014-02-14 DIAGNOSIS — R778 Other specified abnormalities of plasma proteins: Secondary | ICD-10-CM

## 2014-02-14 MED ORDER — TECHNETIUM TC 99M SESTAMIBI GENERIC - CARDIOLITE
33.0000 | Freq: Once | INTRAVENOUS | Status: AC | PRN
  Administered 2014-02-14: 33 via INTRAVENOUS

## 2014-02-14 MED ORDER — REGADENOSON 0.4 MG/5ML IV SOLN
0.4000 mg | Freq: Once | INTRAVENOUS | Status: AC
  Administered 2014-02-14: 0.4 mg via INTRAVENOUS

## 2014-02-14 NOTE — Progress Notes (Signed)
[facility] [address] [place], [place] [address] [phone]    Cardiology Nuclear Med Study  TEANA LINDAHL is a 69 y.o. female     MRN : [id number]     DOB: [birth year]  Procedure Date: 02/14/2014  Nuclear Med Background Indication for Stress Test:  Evaluation for Ischemia History:  COPD and No known CAD Cardiac Risk Factors: Hypertension, NIDDM and Overweight  Symptoms:  Chest Pain, SOB and Syncope   Nuclear Pre-Procedure Caffeine/Decaff Intake:  None NPO After: 8:30am   Lungs:  clear O2 Sat: 93% on room air. IV 0.9% NS with Angio Cath:  22g  IV Site: R Antecubital  IV Started by:  Crissie Figures, RN  Chest Size (in):  44 Cup Size: D  Height: 5' (1.524 m)  Weight:  215 lb (97.523 kg)  BMI:  Body mass index is 41.99 kg/(m^2). Tech Comments:  n/a    Nuclear Med Study 1 or 2 day study: 2 day  Stress Test Type:  Lexiscan  Reading MD: Loralie Champagne, MD  Order Authorizing Provider:  Candee Furbish, MD  Resting Radionuclide: Technetium 30m Sestamibi  Resting Radionuclide Dose:  33.0 mCi on 02/20/2014  Stress Radionuclide:  Technetium 9m Sestamibi  Stress Radionuclide Dose:  33.0 mCi on 02/14/2014          Stress Protocol Rest HR: 88 Stress HR: 106  Rest BP: 151/86 Stress BP: 152/58  Exercise Time (min): n/a METS: n/a   Predicted Max HR: 151 bpm % Max HR: 70.2 bpm Rate Pressure Product: 16748   Dose of Adenosine (mg):  n/a Dose of Lexiscan: 0.4 mg  Dose of Atropine (mg): n/a Dose of Dobutamine: n/a mcg/kg/min (at max HR)  Stress Test Technologist: Glade Lloyd, BS-ES  Nuclear Technologist:  Earl Many, CNMT     Rest Procedure:  Myocardial perfusion imaging was performed at rest 45 minutes following the intravenous administration of Technetium 8m Sestamibi. Rest ECG: NSR, PRWP.  Stress Procedure:  The patient received IV Lexiscan 0.4 mg over 15-seconds.  Technetium 37m Sestamibi injected at 30-seconds.  Quantitative spect  images were obtained after a 45 minute delay.  During the infusion of Lexiscan the patient complained of stomach cramps that resolved in recovery.  Stress ECG: No significant ST segment change suggestive of ischemia.  QPS Raw Data Images:  Acquisition technically good; normal left ventricular size. Stress Images:  Normal homogeneous uptake in all areas of the myocardium. Rest Images:  Normal homogeneous uptake in all areas of the myocardium. Subtraction (SDS):  No evidence of ischemia. Transient Ischemic Dilatation (Normal <1.22):  1.11 Lung/Heart Ratio (Normal <0.45):  0.33  Quantitative Gated Spect Images QGS EDV:  68 ml QGS ESV:  25 ml  Impression Exercise Capacity:  Lexiscan with no exercise. BP Response:  Normal blood pressure response. Clinical Symptoms:  No chest pain or dyspnea ECG Impression:  No significant ST segment change suggestive of ischemia. Comparison with Prior Nuclear Study: No previous nuclear study performed  Overall Impression:  Normal stress nuclear study.  LV Ejection Fraction: 63%.  LV Wall Motion:  NL LV Function; NL Wall Motion  Shannon Lynch

## 2014-02-19 ENCOUNTER — Encounter (HOSPITAL_COMMUNITY): Payer: Medicare Other

## 2014-02-20 ENCOUNTER — Ambulatory Visit (HOSPITAL_COMMUNITY): Payer: Medicare Other | Attending: Cardiology

## 2014-02-20 DIAGNOSIS — R0989 Other specified symptoms and signs involving the circulatory and respiratory systems: Secondary | ICD-10-CM

## 2014-02-20 MED ORDER — TECHNETIUM TC 99M SESTAMIBI GENERIC - CARDIOLITE
30.0000 | Freq: Once | INTRAVENOUS | Status: AC | PRN
  Administered 2014-02-20: 30 via INTRAVENOUS

## 2014-02-21 ENCOUNTER — Encounter: Payer: Self-pay | Admitting: Physician Assistant

## 2014-02-21 ENCOUNTER — Ambulatory Visit (INDEPENDENT_AMBULATORY_CARE_PROVIDER_SITE_OTHER): Payer: Medicare Other | Admitting: Physician Assistant

## 2014-02-21 ENCOUNTER — Telehealth: Payer: Self-pay | Admitting: *Deleted

## 2014-02-21 VITALS — BP 160/88 | HR 80 | Ht 60.0 in | Wt 215.0 lb

## 2014-02-21 DIAGNOSIS — E876 Hypokalemia: Secondary | ICD-10-CM | POA: Diagnosis not present

## 2014-02-21 DIAGNOSIS — I10 Essential (primary) hypertension: Secondary | ICD-10-CM | POA: Diagnosis not present

## 2014-02-21 DIAGNOSIS — R5383 Other fatigue: Secondary | ICD-10-CM | POA: Diagnosis not present

## 2014-02-21 DIAGNOSIS — R778 Other specified abnormalities of plasma proteins: Secondary | ICD-10-CM

## 2014-02-21 DIAGNOSIS — R7989 Other specified abnormal findings of blood chemistry: Secondary | ICD-10-CM | POA: Diagnosis not present

## 2014-02-21 LAB — BASIC METABOLIC PANEL
BUN: 11 mg/dL (ref 6–23)
CO2: 28 meq/L (ref 19–32)
Calcium: 10.9 mg/dL — ABNORMAL HIGH (ref 8.4–10.5)
Chloride: 101 mEq/L (ref 96–112)
Creatinine, Ser: 0.81 mg/dL (ref 0.40–1.20)
GFR: 74.41 mL/min (ref >60.00)
GLUCOSE: 76 mg/dL (ref 70–99)
Potassium: 4.1 mEq/L (ref 3.5–5.1)
SODIUM: 138 meq/L (ref 135–145)

## 2014-02-21 MED ORDER — CARVEDILOL 6.25 MG PO TABS: 6.2500 mg | ORAL_TABLET | Freq: Two times a day (BID) | ORAL | Status: AC

## 2014-02-21 NOTE — Telephone Encounter (Signed)
pt notified about lab results and Brynda Rim. PA had gone over [employer] today while she was in the office. Pt verbalized understanding.

## 2014-02-21 NOTE — Patient Instructions (Signed)
LAB WORK TODAY; BMET  FOLLOW UP WITH DR. Marlou Porch AS NEEDED  INCREASE COREG 6.25 MG 1 TABLET TWICE DAILY; FURTHER REFILLS TO BE FILLED IN PRIMARY CARE PHYSICIAN

## 2014-02-21 NOTE — Progress Notes (Signed)
Cardiology Office Note   Date:  02/21/2014   ID:  Shannon Lynch, DOB 10-14-1945, MRN [id number]  PCP:  Thressa Sheller, MD  Cardiologist:  Dr. Candee Furbish     Chief Complaint  Patient presents with  . Hospitalization Follow-up    chest pain and elevated troponin (demand ischemia) in setting of pyelonephritis      History of Present Illness: Shannon Lynch is a 69 y.o. female who presents for FU on the above.   She is an ex-smoker who has a hx of HTN, DM2, COPD, recurrent chronic UTI, recent L knee surgery (09/2013), FHx of CAD.  Admitted 12/27-12/31 with chest pain in the setting of pyelonephritis/sepsis (E. Coli).  WBC was > 40K.  CT was neg for PE.  Hospital stay was c/b possible development of pneumonia.  She was noted to mildly elevated Troponin (peak 01.0 with flat trend) and was seen by cardiology.  CP was felt to be atypical and elevated Troponin was most likely felt to be 2/2 demand ischemia.  Echo demonstrated normal EF with normal wall motion.  She was set up for an OP stress Myoview.  This demonstrated no ischemia and was felt to be a normal study.  DC notes indicate a hx of daytime hypersomnolence.  Patient was advised to get an OP sleep study.    She is here with her husband.  Doing well since DC. No further chest pain.  No significant dyspnea.  No orthopnea, PND, edema.  No syncope.  BP at home 150/90s.   Studies/Reports Reviewed Today:   - Echocardiogram (01/22/14):  Mod LVH, EF 55-60%, no RWMA, Gr 1 DD, mild to mod MAC  - Nuclear Stress Test (02/14/14):  No ischemia, EF 63%, Normal Study   - Chest CTA (01/21/14):    IMPRESSION:  1. No pulmonary embolus is noted.  2. No aortic aneurysm. No acute infiltrate or pulmonary edema. Mild dependent atelectasis and pleural thickening lung bases posteriorly.  3. Atherosclerotic calcifications of thoracic aorta and mild coronary artery calcifications.  4. Degenerative changes thoracic spine.  5. Abnormal decreased  enhancement with ill-defined contour and loss of normal cortical medullary differentiation upper pole of the left kidney. Inflammatory process, mass or infection cannot be excluded.  Clinical correlation is necessary.  Follow-up enhanced study is recommended as clinically warranted.   Past Medical History  Diagnosis Date  . Hypertension   . Exogenous obesity   . Arthritis   . Anxiety   . Depression   . Diabetes mellitus without complication   . Chronic UTI (urinary tract infection)   . Osteonecrosis   . Hx of echocardiogram     a. Echocardiogram (01/22/14):  Mod LVH, EF 55-60%, no RWMA, Gr 1 DD, mild to mod MAC  . Hx of cardiovascular stress test     a. Nuclear Stress Test (02/14/14):  No ischemia, EF 63%, Normal Study    Past Surgical History  Procedure Laterality Date  . Total knee arthroplasty    . Tubal ligation    . Cataract surgery     . Finger surgery      related to tumors on finger removal   . Total knee arthroplasty Left 09/26/2013    Procedure: LEFT TOTAL KNEE ARTHROPLASTY;  Surgeon: Mauri Pole, MD;  Location: [facility];  Service: Orthopedics;  Laterality: Left;     Current Outpatient Prescriptions  Medication Sig Dispense Refill  . acetaminophen (TYLENOL) 325 MG tablet Take 2 tablets (650 mg total) by  mouth every 6 (six) hours as needed for mild pain (or Fever >/= 101).    . carvedilol (COREG) 3.125 MG tablet Take 1 tablet (3.125 mg total) by mouth 2 (two) times daily with a meal. 60 tablet 1  . cholecalciferol (VITAMIN D) 1000 UNITS tablet Take 1,000 Units by mouth daily.    . clorazepate (TRANXENE) 3.75 MG tablet Take 1.875-3.375 mg by mouth 3 (three) times daily. Takes 1 tablet in the morning, 0.5 tablet at lunch and 1 in the evening.    Marland Kitchen losartan (COZAAR) 100 MG tablet Take 100 mg by mouth every morning.    . metFORMIN (GLUCOPHAGE-XR) 750 MG 24 hr tablet Take 1,500 mg by mouth daily with breakfast.    . polyvinyl alcohol (LIQUIFILM TEARS) 1.4 % ophthalmic  solution Place 1 drop into both eyes 3 (three) times daily as needed for dry eyes.    . pseudoephedrine-acetaminophen (TYLENOL SINUS) 30-500 MG TABS Take 1 tablet by mouth every 4 (four) hours as needed (Sinus).    . vitamin C (ASCORBIC ACID) 500 MG tablet Take 500 mg by mouth 2 (two) times daily.     No current facility-administered medications for this visit.    Allergies:   Ciprofloxacin; Codeine; and Prednisone    Social History:  The patient  reports that she quit smoking about 3 years ago. Her smoking use included Cigarettes. She has never used smokeless tobacco. She reports that she does not drink alcohol or use illicit drugs.   Family History:  The patient's family history includes Emphysema in her father; Heart attack in her paternal aunt.    ROS:  Please see the history of present illness.   Otherwise, review of systems are positive for none.   All other systems are reviewed and negative.    PHYSICAL EXAM: VS:  BP 160/88 mmHg  Pulse 80  Ht 5' (1.524 m)  Wt 215 lb (97.523 kg)  BMI 41.99 kg/m2  LMP 06/23/1995    Wt Readings from Last 3 Encounters:  02/21/14 215 lb (97.523 kg)  02/14/14 215 lb (97.523 kg)  01/25/14 221 lb 11.2 oz (100.562 kg)     GEN: Well nourished, well developed, in no acute distress HEENT: normal Neck: no JVD, no carotid bruits, no masses Cardiac:  Normal S1/S2, RRR; no murmur, no rubs or gallops, no edema  Respiratory:  clear to auscultation bilaterally, no wheezing, rhonchi or rales. GI: soft, nontender, nondistended, + BS MS: no deformity or atrophy Skin: warm and dry  Neuro:  CNs II-XII intact, Strength and sensation are intact Psych: Normal affect   EKG:  EKG is ordered today.  It demonstrates:  NSR, HR 80, normal axis, NSSTTW changes.    Recent Labs: 01/22/2014: ALT 24; Magnesium 1.6; TSH 2.653 01/24/2014: B Natriuretic Peptide 192.3*; Hemoglobin 10.6*; Platelets 243 01/25/2014: BUN 12; Creatinine 0.89; Potassium 3.1*; Sodium 134*     Lipid Panel    Component Value Date/Time   CHOL 169 02/09/2011 0545   TRIG 143 02/09/2011 0545   HDL 33* 02/09/2011 0545   CHOLHDL 5.1 02/09/2011 0545   VLDL 29 02/09/2011 0545   LDLCALC 107* 02/09/2011 0545      ASSESSMENT AND PLAN:  1.  Elevated Troponin in the Setting of Sepsis/Pyelonephritis:  Recent nuclear stress test without ischemia.  Troponin elevation was related to demand ischemia.  No further work-up.   2.  Hypertension:  BP elevated. Increase Coreg to 6.25 mg twice daily.  Further management can be with her PCP.  3.  Fatigue:  She has does not have a significant hx of snoring.  I advised her husband to watch her breathing during sleep.  She knows to discuss with her PCP if this is noted to consider a sleep study. 4.  Hypokalemia:  No FU since DC from the hospital.  Check BMET today.  Current medicines are reviewed at length with the patient today.  The patient does not have concerns regarding medicines.  The following changes have been made:  As above.   Labs/ tests ordered today include:   Orders Placed This Encounter  Procedures  . Basic Metabolic Panel (BMET)  . EKG 12-Lead     Disposition:   FU with Dr. Candee Furbish as needed.     Signed, Versie Starks, MHS 02/21/2014 10:19 AM    [facility] [address], [place], [place]  [address] Phone: [phone]; Fax: [phone]

## 2014-02-21 NOTE — Addendum Note (Signed)
Addended byKathlen Mody, Nicki Reaper T on: 02/21/2014 05:46 PM   Modules accepted: Level of Service

## 2014-02-27 DIAGNOSIS — N390 Urinary tract infection, site not specified: Secondary | ICD-10-CM | POA: Diagnosis not present

## 2014-02-27 DIAGNOSIS — M810 Age-related osteoporosis without current pathological fracture: Secondary | ICD-10-CM | POA: Diagnosis not present

## 2014-02-27 DIAGNOSIS — I10 Essential (primary) hypertension: Secondary | ICD-10-CM | POA: Diagnosis not present

## 2014-02-27 DIAGNOSIS — Z0000 Encounter for general adult medical examination without abnormal findings: Secondary | ICD-10-CM | POA: Diagnosis not present

## 2014-02-27 DIAGNOSIS — M859 Disorder of bone density and structure, unspecified: Secondary | ICD-10-CM | POA: Diagnosis not present

## 2014-02-27 DIAGNOSIS — Z23 Encounter for immunization: Secondary | ICD-10-CM | POA: Diagnosis not present

## 2014-02-27 DIAGNOSIS — E1165 Type 2 diabetes mellitus with hyperglycemia: Secondary | ICD-10-CM | POA: Diagnosis not present

## 2014-03-05 DIAGNOSIS — E1121 Type 2 diabetes mellitus with diabetic nephropathy: Secondary | ICD-10-CM | POA: Diagnosis not present

## 2014-03-05 DIAGNOSIS — I10 Essential (primary) hypertension: Secondary | ICD-10-CM | POA: Diagnosis not present

## 2014-03-05 DIAGNOSIS — E785 Hyperlipidemia, unspecified: Secondary | ICD-10-CM | POA: Diagnosis not present

## 2014-03-05 DIAGNOSIS — F419 Anxiety disorder, unspecified: Secondary | ICD-10-CM | POA: Diagnosis not present

## 2014-05-29 DIAGNOSIS — M949 Disorder of cartilage, unspecified: Secondary | ICD-10-CM | POA: Diagnosis not present

## 2014-05-29 DIAGNOSIS — I10 Essential (primary) hypertension: Secondary | ICD-10-CM | POA: Diagnosis not present

## 2014-05-29 DIAGNOSIS — Z471 Aftercare following joint replacement surgery: Secondary | ICD-10-CM | POA: Diagnosis not present

## 2014-05-29 DIAGNOSIS — Z96652 Presence of left artificial knee joint: Secondary | ICD-10-CM | POA: Diagnosis not present

## 2014-05-29 DIAGNOSIS — E1121 Type 2 diabetes mellitus with diabetic nephropathy: Secondary | ICD-10-CM | POA: Diagnosis not present

## 2014-06-06 DIAGNOSIS — E785 Hyperlipidemia, unspecified: Secondary | ICD-10-CM | POA: Diagnosis not present

## 2014-06-06 DIAGNOSIS — N181 Chronic kidney disease, stage 1: Secondary | ICD-10-CM | POA: Diagnosis not present

## 2014-06-06 DIAGNOSIS — E1121 Type 2 diabetes mellitus with diabetic nephropathy: Secondary | ICD-10-CM | POA: Diagnosis not present

## 2014-06-06 DIAGNOSIS — I10 Essential (primary) hypertension: Secondary | ICD-10-CM | POA: Diagnosis not present

## 2014-06-11 DIAGNOSIS — M25562 Pain in left knee: Secondary | ICD-10-CM | POA: Diagnosis not present

## 2014-06-14 DIAGNOSIS — M25562 Pain in left knee: Secondary | ICD-10-CM | POA: Diagnosis not present

## 2014-06-19 DIAGNOSIS — M25562 Pain in left knee: Secondary | ICD-10-CM | POA: Diagnosis not present

## 2014-06-21 DIAGNOSIS — M25562 Pain in left knee: Secondary | ICD-10-CM | POA: Diagnosis not present

## 2014-06-26 DIAGNOSIS — M25562 Pain in left knee: Secondary | ICD-10-CM | POA: Diagnosis not present

## 2014-07-03 DIAGNOSIS — M25562 Pain in left knee: Secondary | ICD-10-CM | POA: Diagnosis not present

## 2014-07-04 DIAGNOSIS — Z96652 Presence of left artificial knee joint: Secondary | ICD-10-CM | POA: Diagnosis not present

## 2014-07-04 DIAGNOSIS — Z471 Aftercare following joint replacement surgery: Secondary | ICD-10-CM | POA: Diagnosis not present

## 2014-07-31 DIAGNOSIS — L219 Seborrheic dermatitis, unspecified: Secondary | ICD-10-CM | POA: Diagnosis not present

## 2014-09-05 DIAGNOSIS — E559 Vitamin D deficiency, unspecified: Secondary | ICD-10-CM | POA: Diagnosis not present

## 2014-09-05 DIAGNOSIS — E1121 Type 2 diabetes mellitus with diabetic nephropathy: Secondary | ICD-10-CM | POA: Diagnosis not present

## 2014-09-05 DIAGNOSIS — I10 Essential (primary) hypertension: Secondary | ICD-10-CM | POA: Diagnosis not present

## 2014-09-05 DIAGNOSIS — M8580 Other specified disorders of bone density and structure, unspecified site: Secondary | ICD-10-CM | POA: Diagnosis not present

## 2014-09-12 DIAGNOSIS — F17211 Nicotine dependence, cigarettes, in remission: Secondary | ICD-10-CM | POA: Diagnosis not present

## 2014-09-12 DIAGNOSIS — E213 Hyperparathyroidism, unspecified: Secondary | ICD-10-CM | POA: Diagnosis not present

## 2014-09-12 DIAGNOSIS — I509 Heart failure, unspecified: Secondary | ICD-10-CM | POA: Diagnosis not present

## 2014-09-12 DIAGNOSIS — D473 Essential (hemorrhagic) thrombocythemia: Secondary | ICD-10-CM | POA: Diagnosis not present

## 2014-09-12 DIAGNOSIS — E1122 Type 2 diabetes mellitus with diabetic chronic kidney disease: Secondary | ICD-10-CM | POA: Diagnosis not present

## 2014-11-14 DIAGNOSIS — Z471 Aftercare following joint replacement surgery: Secondary | ICD-10-CM | POA: Diagnosis not present

## 2014-11-14 DIAGNOSIS — Z96652 Presence of left artificial knee joint: Secondary | ICD-10-CM | POA: Diagnosis not present

## 2014-12-12 DIAGNOSIS — E559 Vitamin D deficiency, unspecified: Secondary | ICD-10-CM | POA: Diagnosis not present

## 2014-12-12 DIAGNOSIS — F3341 Major depressive disorder, recurrent, in partial remission: Secondary | ICD-10-CM | POA: Diagnosis not present

## 2014-12-12 DIAGNOSIS — E1122 Type 2 diabetes mellitus with diabetic chronic kidney disease: Secondary | ICD-10-CM | POA: Diagnosis not present

## 2014-12-12 DIAGNOSIS — I129 Hypertensive chronic kidney disease with stage 1 through stage 4 chronic kidney disease, or unspecified chronic kidney disease: Secondary | ICD-10-CM | POA: Diagnosis not present

## 2014-12-12 DIAGNOSIS — M8580 Other specified disorders of bone density and structure, unspecified site: Secondary | ICD-10-CM | POA: Diagnosis not present

## 2014-12-26 DIAGNOSIS — F17211 Nicotine dependence, cigarettes, in remission: Secondary | ICD-10-CM | POA: Diagnosis not present

## 2014-12-26 DIAGNOSIS — F339 Major depressive disorder, recurrent, unspecified: Secondary | ICD-10-CM | POA: Diagnosis not present

## 2014-12-26 DIAGNOSIS — Z23 Encounter for immunization: Secondary | ICD-10-CM | POA: Diagnosis not present

## 2014-12-26 DIAGNOSIS — E1122 Type 2 diabetes mellitus with diabetic chronic kidney disease: Secondary | ICD-10-CM | POA: Diagnosis not present

## 2014-12-26 DIAGNOSIS — I509 Heart failure, unspecified: Secondary | ICD-10-CM | POA: Diagnosis not present

## 2014-12-26 DIAGNOSIS — J449 Chronic obstructive pulmonary disease, unspecified: Secondary | ICD-10-CM | POA: Diagnosis not present

## 2015-06-18 DIAGNOSIS — I509 Heart failure, unspecified: Secondary | ICD-10-CM | POA: Diagnosis not present

## 2015-06-18 DIAGNOSIS — Z1389 Encounter for screening for other disorder: Secondary | ICD-10-CM | POA: Diagnosis not present

## 2015-06-18 DIAGNOSIS — N390 Urinary tract infection, site not specified: Secondary | ICD-10-CM | POA: Diagnosis not present

## 2015-06-18 DIAGNOSIS — E559 Vitamin D deficiency, unspecified: Secondary | ICD-10-CM | POA: Diagnosis not present

## 2015-06-18 DIAGNOSIS — E1122 Type 2 diabetes mellitus with diabetic chronic kidney disease: Secondary | ICD-10-CM | POA: Diagnosis not present

## 2015-06-18 DIAGNOSIS — Z0000 Encounter for general adult medical examination without abnormal findings: Secondary | ICD-10-CM | POA: Diagnosis not present

## 2015-06-18 DIAGNOSIS — M8580 Other specified disorders of bone density and structure, unspecified site: Secondary | ICD-10-CM | POA: Diagnosis not present

## 2015-06-18 DIAGNOSIS — Z23 Encounter for immunization: Secondary | ICD-10-CM | POA: Diagnosis not present

## 2015-06-26 DIAGNOSIS — I5032 Chronic diastolic (congestive) heart failure: Secondary | ICD-10-CM | POA: Diagnosis not present

## 2015-06-26 DIAGNOSIS — E1122 Type 2 diabetes mellitus with diabetic chronic kidney disease: Secondary | ICD-10-CM | POA: Diagnosis not present

## 2015-06-26 DIAGNOSIS — F17211 Nicotine dependence, cigarettes, in remission: Secondary | ICD-10-CM | POA: Diagnosis not present

## 2015-06-26 DIAGNOSIS — J449 Chronic obstructive pulmonary disease, unspecified: Secondary | ICD-10-CM | POA: Diagnosis not present

## 2015-08-28 DIAGNOSIS — R300 Dysuria: Secondary | ICD-10-CM | POA: Diagnosis not present

## 2015-08-28 DIAGNOSIS — N390 Urinary tract infection, site not specified: Secondary | ICD-10-CM | POA: Diagnosis not present

## 2015-09-19 DIAGNOSIS — M25531 Pain in right wrist: Secondary | ICD-10-CM | POA: Diagnosis not present

## 2015-09-19 DIAGNOSIS — S638X1A Sprain of other part of right wrist and hand, initial encounter: Secondary | ICD-10-CM | POA: Diagnosis not present

## 2015-10-03 DIAGNOSIS — M65311 Trigger thumb, right thumb: Secondary | ICD-10-CM | POA: Diagnosis not present

## 2015-10-03 DIAGNOSIS — M25531 Pain in right wrist: Secondary | ICD-10-CM | POA: Diagnosis not present

## 2015-12-13 ENCOUNTER — Emergency Department (HOSPITAL_COMMUNITY)
Admission: EM | Admit: 2015-12-13 | Discharge: 2015-12-13 | Disposition: A | Discharge status: Home / Self Care | Payer: Medicare Other | Attending: Emergency Medicine | Admitting: Emergency Medicine

## 2015-12-13 ENCOUNTER — Encounter (HOSPITAL_COMMUNITY): Payer: Self-pay | Admitting: Emergency Medicine

## 2015-12-13 ENCOUNTER — Emergency Department (HOSPITAL_COMMUNITY): Payer: Medicare Other

## 2015-12-13 DIAGNOSIS — Z7984 Long term (current) use of oral hypoglycemic drugs: Secondary | ICD-10-CM | POA: Diagnosis not present

## 2015-12-13 DIAGNOSIS — R079 Chest pain, unspecified: Secondary | ICD-10-CM | POA: Diagnosis not present

## 2015-12-13 DIAGNOSIS — R0789 Other chest pain: Secondary | ICD-10-CM | POA: Diagnosis not present

## 2015-12-13 DIAGNOSIS — R072 Precordial pain: Secondary | ICD-10-CM | POA: Diagnosis present

## 2015-12-13 DIAGNOSIS — I10 Essential (primary) hypertension: Secondary | ICD-10-CM | POA: Diagnosis not present

## 2015-12-13 DIAGNOSIS — Z7982 Long term (current) use of aspirin: Secondary | ICD-10-CM | POA: Insufficient documentation

## 2015-12-13 DIAGNOSIS — J449 Chronic obstructive pulmonary disease, unspecified: Secondary | ICD-10-CM | POA: Insufficient documentation

## 2015-12-13 DIAGNOSIS — E119 Type 2 diabetes mellitus without complications: Secondary | ICD-10-CM | POA: Insufficient documentation

## 2015-12-13 DIAGNOSIS — Z96652 Presence of left artificial knee joint: Secondary | ICD-10-CM | POA: Diagnosis not present

## 2015-12-13 DIAGNOSIS — Z79899 Other long term (current) drug therapy: Secondary | ICD-10-CM | POA: Insufficient documentation

## 2015-12-13 DIAGNOSIS — Z87891 Personal history of nicotine dependence: Secondary | ICD-10-CM | POA: Diagnosis not present

## 2015-12-13 LAB — COMPREHENSIVE METABOLIC PANEL
ALT: 27 U/L (ref 14–54)
ANION GAP: 8 (ref 5–15)
AST: 44 U/L — ABNORMAL HIGH (ref 15–41)
Albumin: 3.4 g/dL — ABNORMAL LOW (ref 3.5–5.0)
Alkaline Phosphatase: 113 U/L (ref 38–126)
BILIRUBIN TOTAL: 0.7 mg/dL (ref 0.3–1.2)
BUN: 11 mg/dL (ref 6–20)
CO2: 28 mmol/L (ref 22–32)
Calcium: 10.1 mg/dL (ref 8.9–10.3)
Chloride: 99 mmol/L — ABNORMAL LOW (ref 101–111)
Creatinine, Ser: 0.73 mg/dL (ref 0.44–1.00)
GFR calc Af Amer: >60 mL/min (ref >60)
Glucose, Bld: 190 mg/dL — ABNORMAL HIGH (ref 65–99)
POTASSIUM: 3.4 mmol/L — AB (ref 3.5–5.1)
Sodium: 135 mmol/L (ref 135–145)
TOTAL PROTEIN: 7.1 g/dL (ref 6.5–8.1)

## 2015-12-13 LAB — CBC
HEMATOCRIT: 37.3 % (ref 36.0–46.0)
Hemoglobin: 12.2 g/dL (ref 12.0–15.0)
MCH: 29.3 pg (ref 26.0–34.0)
MCHC: 32.7 g/dL (ref 30.0–36.0)
MCV: 89.4 fL (ref 78.0–100.0)
Platelets: 319 10*3/uL (ref 150–400)
RBC: 4.17 MIL/uL (ref 3.87–5.11)
RDW: 13.6 % (ref 11.5–15.5)
WBC: 12.6 10*3/uL — ABNORMAL HIGH (ref 4.0–10.5)

## 2015-12-13 LAB — LIPASE, BLOOD: LIPASE: 32 U/L (ref 11–51)

## 2015-12-13 LAB — I-STAT TROPONIN, ED: TROPONIN I, POC: 0 ng/mL (ref 0.00–0.08)

## 2015-12-13 MED ORDER — GI COCKTAIL ~~LOC~~
30.0000 mL | Freq: Once | ORAL | Status: AC
  Administered 2015-12-13: 30 mL via ORAL
  Filled 2015-12-13: qty 30

## 2015-12-13 NOTE — ED Provider Notes (Signed)
[facility] DEPT Provider Note   CSN: [id number] Arrival date & time: 12/13/15  [id number]     History   Chief Complaint Chief Complaint  Patient presents with  . Chest Pain    HPI Shannon Lynch is a 70 y.o. female.  Patient is a 70 year old female with a history of diabetes, hypertension presenting today with chest pain that started at 10 PM last night. She states she was sitting doing nothing when the discomfort started. It got worse as the night progressed. It was a sharp uncomfortable sensation in the center of her chest that did not radiate. It was not worse with deep breaths or eating. It was however much worse with lying down. She took an aspirin at home without improvement. The pain seemed to improve in the middle of the night where she was able to go to sleep but woke up at 4 AM with persistent pain. She called EMS and they gave her 4 baby aspirin. After she chewed and 8 those the pain abruptly stopped. She denied any abdominal pain, nausea or vomiting. She had no shortness of breath, arm pain, cough. She has had more nasal congestion over the last 1 week but denies any significant coughing. She has no unilateral leg pain or swelling.   The history is provided by the patient.  Chest Pain   This is a new problem. The current episode started yesterday. The problem occurs constantly. The problem has been resolved. Associated with: started abruptly at 10pm last night. The pain is present in the substernal region. The pain is at a severity of 7/10. The pain is severe. The quality of the pain is described as sharp and stabbing. The pain does not radiate. The symptoms are aggravated by certain positions (worse with laying down). Pertinent negatives include no abdominal pain, no back pain, no cough, no irregular heartbeat, no leg pain, no lower extremity edema, no nausea, no palpitations, no shortness of breath, no sputum production, no vomiting and no weakness. She has tried antacids  for the symptoms. The treatment provided no relief. Risk factors include being elderly.  Her past medical history is significant for hypertension. Past medical history comments: no cardiac disease.  well controlled DM Past workup comments: prior stress echo that was normal several years ago.    Past Medical History:  Diagnosis Date  . Anxiety   . Arthritis   . Chronic UTI (urinary tract infection)   . Depression   . Diabetes mellitus without complication ([place])   . Exogenous obesity   . Hx of cardiovascular stress test    a. Nuclear Stress Test (02/14/14):  No ischemia, EF 63%, Normal Study  . Hx of echocardiogram    a. Echocardiogram (01/22/14):  Mod LVH, EF 55-60%, no RWMA, Gr 1 DD, mild to mod MAC  . Hypertension   . Osteonecrosis [facility])     Patient Active Problem List   Diagnosis Date Noted  . Dyspnea 01/24/2014  . Pain in the chest   . Chest pain 01/21/2014  . Pyelonephritis 01/21/2014  . Sepsis ([place]) 01/21/2014  . Hypokalemia 01/21/2014  . Elevated troponin 01/21/2014  . Morbid obesity ([place]) 09/27/2013  . S/P left TKA 09/26/2013  . Fracture of right shoulder 02/07/2011  . DM type 2 (diabetes mellitus, type 2) ([place]) 02/06/2011  . Bronchitis 12/17/2010  . LOW BACK PAIN, ACUTE 12/30/2009  . RENAL COLIC 99991111  . EXOGENOUS OBESITY 07/21/2007  . ANXIETY DEPRESSION 07/21/2007  . COPD, MILD 07/21/2007  .  Essential hypertension 12/01/2006    Past Surgical History:  Procedure Laterality Date  . cataract surgery     . FINGER SURGERY     related to tumors on finger removal   . TOTAL KNEE ARTHROPLASTY    . TOTAL KNEE ARTHROPLASTY Left 09/26/2013   Procedure: LEFT TOTAL KNEE ARTHROPLASTY;  Surgeon: Mauri Pole, MD;  Location: [facility];  Service: Orthopedics;  Laterality: Left;  . TUBAL LIGATION      OB History    Gravida Para Term Preterm AB Living   0             SAB TAB Ectopic Multiple Live Births                   Home Medications    Prior to Admission  medications   Medication Sig Start Date End Date Taking? Authorizing Provider  acetaminophen (TYLENOL) 325 MG tablet Take 2 tablets (650 mg total) by mouth every 6 (six) hours as needed for mild pain (or Fever >/= 101). 09/27/13  Yes Danae Orleans, PA-C  aspirin 81 MG chewable tablet Chew 81 mg by mouth daily.   Yes Historical Provider, MD  carvedilol (COREG) 6.25 MG tablet Take 1 tablet (6.25 mg total) by mouth 2 (two) times daily with a meal. 02/21/14  Yes Liliane Shi, PA-C  cholecalciferol (VITAMIN D) 1000 UNITS tablet Take 1,000 Units by mouth daily.   Yes Historical Provider, MD  clorazepate (TRANXENE) 3.75 MG tablet Take 1.875-3.375 mg by mouth 3 (three) times daily. Takes 1 tablet in the morning, 0.5 tablet at lunch and 1 in the evening.   Yes Historical Provider, MD  CRANBERRY PO Take 1 tablet by mouth 2 (two) times daily.   Yes Historical Provider, MD  DULoxetine (CYMBALTA) 60 MG capsule Take 60 mg by mouth daily. 12/05/15  Yes Historical Provider, MD  guaiFENesin (MUCINEX) 600 MG 12 hr tablet Take 600 mg by mouth 2 (two) times daily as needed for cough.   Yes Historical Provider, MD  losartan (COZAAR) 100 MG tablet Take 100 mg by mouth every morning.   Yes Historical Provider, MD  metFORMIN (GLUCOPHAGE-XR) 750 MG 24 hr tablet Take 1,500 mg by mouth daily with breakfast.   Yes Historical Provider, MD  polyvinyl alcohol (LIQUIFILM TEARS) 1.4 % ophthalmic solution Place 1 drop into both eyes 3 (three) times daily as needed for dry eyes.   Yes Historical Provider, MD  pravastatin (PRAVACHOL) 10 MG tablet Take 10 mg by mouth daily. 11/28/15  Yes Historical Provider, MD  pseudoephedrine-acetaminophen (TYLENOL SINUS) 30-500 MG TABS Take 1 tablet by mouth every 4 (four) hours as needed (Sinus).   Yes Historical Provider, MD  vitamin C (ASCORBIC ACID) 500 MG tablet Take 500 mg by mouth 2 (two) times daily.   Yes Historical Provider, MD    Family History Family History  Problem Relation Age of  Onset  . Emphysema Father   . Heart attack Paternal Aunt     Social History Social History  Substance Use Topics  . Smoking status: Former Smoker    Types: Cigarettes    Quit date: 06/23/2010  . Smokeless tobacco: Never Used     Comment: E-Cigarette  . Alcohol use No     Allergies   Ciprofloxacin; Codeine; and Prednisone   Review of Systems Review of Systems  Respiratory: Negative for cough, sputum production and shortness of breath.   Cardiovascular: Positive for chest pain. Negative for palpitations.  Gastrointestinal: Negative  for abdominal pain, nausea and vomiting.  Musculoskeletal: Negative for back pain.  Neurological: Negative for weakness.  All other systems reviewed and are negative.    Physical Exam Updated Vital Signs BP 178/81   Pulse 80   Temp 98.3 F (36.8 C) (Oral)   Resp 18   LMP 06/23/1995   SpO2 97%   Physical Exam  Constitutional: She is oriented to person, place, and time. She appears well-developed and well-nourished. No distress.  HENT:  Head: Normocephalic and atraumatic.  Mouth/Throat: Oropharynx is clear and moist.  Eyes: Conjunctivae and EOM are normal. Pupils are equal, round, and reactive to light.  Neck: Normal range of motion. Neck supple.  Cardiovascular: Normal rate, regular rhythm and intact distal pulses.   No murmur heard. Pulmonary/Chest: Effort normal and breath sounds normal. No respiratory distress. She has no wheezes. She has no rales. She exhibits no tenderness.  Abdominal: Soft. She exhibits no distension. There is tenderness in the epigastric area. There is no rebound and no guarding.    Musculoskeletal: Normal range of motion. She exhibits no edema or tenderness.  Neurological: She is alert and oriented to person, place, and time.  Skin: Skin is warm and dry. No rash noted. No erythema.  Psychiatric: She has a normal mood and affect. Her behavior is normal.  Nursing note and vitals reviewed.    ED Treatments /  Results  Labs (all labs ordered are listed, but only abnormal results are displayed) Labs Reviewed  CBC - Abnormal; Notable for the following:       Result Value   WBC 12.6 (*)    All other components within normal limits  COMPREHENSIVE METABOLIC PANEL - Abnormal; Notable for the following:    Potassium 3.4 (*)    Chloride 99 (*)    Glucose, Bld 190 (*)    Albumin 3.4 (*)    AST 44 (*)    All other components within normal limits  LIPASE, BLOOD  I-STAT TROPOININ, ED    EKG  EKG Interpretation  Date/Time:  Friday December 13 2015 09:36:42 EST Ventricular Rate:  87 PR Interval:    QRS Duration: 81 QT Interval:  351 QTC Calculation: 423 R Axis:   7 Text Interpretation:  Sinus or ectopic atrial rhythm No significant change since last tracing Confirmed by Maryan Rued  MD, Loree Fee ([id number]) on 12/13/2015 10:53:13 AM       Radiology Dg Chest 2 View  Result Date: 12/13/2015 CLINICAL DATA:  Chest pain and pressure. EXAM: CHEST  2 VIEW COMPARISON:  Previous examinations, including the chest CTA dated 01/21/2014. FINDINGS: Normal sized heart. Aortic arch calcifications. Prominent inferior right hilum with little change since 02/24/2006. This was due to overlapping vessels on the previous CTA. The interstitial markings remain mildly prominent. Previously demonstrated old, healed, comminuted and impacted right humeral neck fracture. Thoracic spine degenerative changes. IMPRESSION: 1. No acute abnormality. 2. Mild chronic interstitial lung disease. 3. Aortic atherosclerosis. Electronically Signed   By: Claudie Revering M.D.   On: 12/13/2015 10:23    Procedures Procedures (including critical care time)  Medications Ordered in ED Medications - No data to display   Initial Impression / Assessment and Plan / ED Course  I have reviewed the triage vital signs and the nursing notes.  Pertinent labs & imaging results that were available during my care of the patient were reviewed by me and  considered in my medical decision making (see chart for details).  Clinical Course    Patient  is a 70 year old female presenting today with chest pain that started 10 PM last night. The pain was persistent until she got into the ambulance with EMS. She is currently pain-free. It was discomfort in the center of her chest that did not radiate. She denied any associated symptoms. She has no infectious symptoms concerning for pneumonia. Low suspicion for dissection or PE. Patient is low risk Wells criteria. Lower suspicion for ACS as patient had no radiation of her pain and no associated symptoms.  Heart score of 3 for risk factors and age. Likely the pain is GI in nature. It was worse with lying down and better with sitting up. She has no evidence of fluid overload and no unilateral leg pain or swelling. EKG within normal limits. Chest x-ray without acute findings. Labs other than a mild leukocytosis of 12,000 are within normal limits.  Lipase within normal limits and liver function tests without acute findings. Patient has no upper abdominal pain consistent with cholelithiasis. She does have some minimal epigastric tenderness.  Patient was able to get appearing ambulate to the bathroom without discomfort or difficulty. She is otherwise well-appearing. Feel that patient is safe for discharge home.  Final Clinical Impressions(s) / ED Diagnoses   Final diagnoses:  Atypical chest pain    New Prescriptions New Prescriptions   No medications on file     Blanchie Dessert, MD 12/13/15 1252

## 2015-12-13 NOTE — ED Triage Notes (Addendum)
Pt from home via [facility] with c/o sharp CP non-radiating while at rest starting last night.  Pt states she tried drinking a soda this morning to help with indigestion with no relief.  Pt reports no hx of the same.  Pain was gone during transportation but returned with ambulation from stretcher to bed.  Pain has mild increase with palpation to chest.  Given 324 mg aspirin. NAD, A&O.

## 2015-12-13 NOTE — ED Notes (Signed)
Gave patient water patient resting

## 2015-12-25 DIAGNOSIS — I129 Hypertensive chronic kidney disease with stage 1 through stage 4 chronic kidney disease, or unspecified chronic kidney disease: Secondary | ICD-10-CM | POA: Diagnosis not present

## 2015-12-25 DIAGNOSIS — M8580 Other specified disorders of bone density and structure, unspecified site: Secondary | ICD-10-CM | POA: Diagnosis not present

## 2015-12-25 DIAGNOSIS — E1122 Type 2 diabetes mellitus with diabetic chronic kidney disease: Secondary | ICD-10-CM | POA: Diagnosis not present

## 2016-01-01 DIAGNOSIS — E1122 Type 2 diabetes mellitus with diabetic chronic kidney disease: Secondary | ICD-10-CM | POA: Diagnosis not present

## 2016-01-01 DIAGNOSIS — Z23 Encounter for immunization: Secondary | ICD-10-CM | POA: Diagnosis not present

## 2016-01-01 DIAGNOSIS — I129 Hypertensive chronic kidney disease with stage 1 through stage 4 chronic kidney disease, or unspecified chronic kidney disease: Secondary | ICD-10-CM | POA: Diagnosis not present

## 2016-01-01 DIAGNOSIS — E785 Hyperlipidemia, unspecified: Secondary | ICD-10-CM | POA: Diagnosis not present

## 2016-05-15 DIAGNOSIS — L821 Other seborrheic keratosis: Secondary | ICD-10-CM | POA: Diagnosis not present

## 2016-05-15 DIAGNOSIS — L218 Other seborrheic dermatitis: Secondary | ICD-10-CM | POA: Diagnosis not present

## 2016-06-24 DIAGNOSIS — N390 Urinary tract infection, site not specified: Secondary | ICD-10-CM | POA: Diagnosis not present

## 2016-06-24 DIAGNOSIS — Z0000 Encounter for general adult medical examination without abnormal findings: Secondary | ICD-10-CM | POA: Diagnosis not present

## 2016-06-24 DIAGNOSIS — I10 Essential (primary) hypertension: Secondary | ICD-10-CM | POA: Diagnosis not present

## 2016-06-24 DIAGNOSIS — E1122 Type 2 diabetes mellitus with diabetic chronic kidney disease: Secondary | ICD-10-CM | POA: Diagnosis not present

## 2016-06-29 DIAGNOSIS — E1122 Type 2 diabetes mellitus with diabetic chronic kidney disease: Secondary | ICD-10-CM | POA: Diagnosis not present

## 2016-06-29 DIAGNOSIS — Z0000 Encounter for general adult medical examination without abnormal findings: Secondary | ICD-10-CM | POA: Diagnosis not present

## 2016-06-29 DIAGNOSIS — I5032 Chronic diastolic (congestive) heart failure: Secondary | ICD-10-CM | POA: Diagnosis not present

## 2016-12-25 DIAGNOSIS — E1122 Type 2 diabetes mellitus with diabetic chronic kidney disease: Secondary | ICD-10-CM | POA: Diagnosis not present

## 2016-12-25 DIAGNOSIS — E785 Hyperlipidemia, unspecified: Secondary | ICD-10-CM | POA: Diagnosis not present

## 2016-12-25 DIAGNOSIS — M8580 Other specified disorders of bone density and structure, unspecified site: Secondary | ICD-10-CM | POA: Diagnosis not present

## 2016-12-25 DIAGNOSIS — I129 Hypertensive chronic kidney disease with stage 1 through stage 4 chronic kidney disease, or unspecified chronic kidney disease: Secondary | ICD-10-CM | POA: Diagnosis not present

## 2016-12-30 DIAGNOSIS — E7800 Pure hypercholesterolemia, unspecified: Secondary | ICD-10-CM | POA: Diagnosis not present

## 2016-12-30 DIAGNOSIS — I10 Essential (primary) hypertension: Secondary | ICD-10-CM | POA: Diagnosis not present

## 2016-12-30 DIAGNOSIS — N390 Urinary tract infection, site not specified: Secondary | ICD-10-CM | POA: Diagnosis not present

## 2016-12-30 DIAGNOSIS — R748 Abnormal levels of other serum enzymes: Secondary | ICD-10-CM | POA: Diagnosis not present

## 2016-12-30 DIAGNOSIS — R319 Hematuria, unspecified: Secondary | ICD-10-CM | POA: Diagnosis not present

## 2017-01-28 DIAGNOSIS — E213 Hyperparathyroidism, unspecified: Secondary | ICD-10-CM | POA: Diagnosis not present

## 2017-01-28 DIAGNOSIS — E1165 Type 2 diabetes mellitus with hyperglycemia: Secondary | ICD-10-CM | POA: Diagnosis not present

## 2017-01-28 DIAGNOSIS — R945 Abnormal results of liver function studies: Secondary | ICD-10-CM | POA: Diagnosis not present

## 2017-01-28 DIAGNOSIS — R748 Abnormal levels of other serum enzymes: Secondary | ICD-10-CM | POA: Diagnosis not present

## 2017-02-04 DIAGNOSIS — N390 Urinary tract infection, site not specified: Secondary | ICD-10-CM | POA: Diagnosis not present

## 2017-02-04 DIAGNOSIS — E1165 Type 2 diabetes mellitus with hyperglycemia: Secondary | ICD-10-CM | POA: Diagnosis not present

## 2017-02-22 DIAGNOSIS — E1122 Type 2 diabetes mellitus with diabetic chronic kidney disease: Secondary | ICD-10-CM | POA: Diagnosis not present

## 2017-02-22 DIAGNOSIS — M8580 Other specified disorders of bone density and structure, unspecified site: Secondary | ICD-10-CM | POA: Diagnosis not present

## 2017-02-24 DIAGNOSIS — E785 Hyperlipidemia, unspecified: Secondary | ICD-10-CM | POA: Diagnosis not present

## 2017-02-24 DIAGNOSIS — I129 Hypertensive chronic kidney disease with stage 1 through stage 4 chronic kidney disease, or unspecified chronic kidney disease: Secondary | ICD-10-CM | POA: Diagnosis not present

## 2017-02-24 DIAGNOSIS — E1122 Type 2 diabetes mellitus with diabetic chronic kidney disease: Secondary | ICD-10-CM | POA: Diagnosis not present

## 2017-03-16 DIAGNOSIS — N390 Urinary tract infection, site not specified: Secondary | ICD-10-CM | POA: Diagnosis not present

## 2017-03-16 DIAGNOSIS — E1122 Type 2 diabetes mellitus with diabetic chronic kidney disease: Secondary | ICD-10-CM | POA: Diagnosis not present

## 2017-03-16 DIAGNOSIS — I129 Hypertensive chronic kidney disease with stage 1 through stage 4 chronic kidney disease, or unspecified chronic kidney disease: Secondary | ICD-10-CM | POA: Diagnosis not present

## 2017-03-16 DIAGNOSIS — N182 Chronic kidney disease, stage 2 (mild): Secondary | ICD-10-CM | POA: Diagnosis not present

## 2017-03-30 DIAGNOSIS — E1122 Type 2 diabetes mellitus with diabetic chronic kidney disease: Secondary | ICD-10-CM | POA: Diagnosis not present

## 2017-03-30 DIAGNOSIS — M8580 Other specified disorders of bone density and structure, unspecified site: Secondary | ICD-10-CM | POA: Diagnosis not present

## 2017-07-02 DIAGNOSIS — E119 Type 2 diabetes mellitus without complications: Secondary | ICD-10-CM | POA: Diagnosis not present

## 2017-07-05 DIAGNOSIS — M8580 Other specified disorders of bone density and structure, unspecified site: Secondary | ICD-10-CM | POA: Diagnosis not present

## 2017-07-05 DIAGNOSIS — E1122 Type 2 diabetes mellitus with diabetic chronic kidney disease: Secondary | ICD-10-CM | POA: Diagnosis not present

## 2017-08-26 DIAGNOSIS — I10 Essential (primary) hypertension: Secondary | ICD-10-CM | POA: Diagnosis not present

## 2017-08-26 DIAGNOSIS — N390 Urinary tract infection, site not specified: Secondary | ICD-10-CM | POA: Diagnosis not present

## 2017-08-26 DIAGNOSIS — E785 Hyperlipidemia, unspecified: Secondary | ICD-10-CM | POA: Diagnosis not present

## 2017-08-26 DIAGNOSIS — E1122 Type 2 diabetes mellitus with diabetic chronic kidney disease: Secondary | ICD-10-CM | POA: Diagnosis not present

## 2017-08-26 DIAGNOSIS — E559 Vitamin D deficiency, unspecified: Secondary | ICD-10-CM | POA: Diagnosis not present

## 2017-09-01 DIAGNOSIS — E875 Hyperkalemia: Secondary | ICD-10-CM | POA: Diagnosis not present

## 2017-09-01 DIAGNOSIS — Z0000 Encounter for general adult medical examination without abnormal findings: Secondary | ICD-10-CM | POA: Diagnosis not present

## 2017-09-01 DIAGNOSIS — I10 Essential (primary) hypertension: Secondary | ICD-10-CM | POA: Diagnosis not present

## 2017-09-01 DIAGNOSIS — N390 Urinary tract infection, site not specified: Secondary | ICD-10-CM | POA: Diagnosis not present

## 2017-10-05 DIAGNOSIS — M8580 Other specified disorders of bone density and structure, unspecified site: Secondary | ICD-10-CM | POA: Diagnosis not present

## 2017-10-05 DIAGNOSIS — E1122 Type 2 diabetes mellitus with diabetic chronic kidney disease: Secondary | ICD-10-CM | POA: Diagnosis not present

## 2017-10-19 DIAGNOSIS — R945 Abnormal results of liver function studies: Secondary | ICD-10-CM | POA: Diagnosis not present

## 2017-10-19 DIAGNOSIS — E875 Hyperkalemia: Secondary | ICD-10-CM | POA: Diagnosis not present

## 2018-03-07 DIAGNOSIS — L218 Other seborrheic dermatitis: Secondary | ICD-10-CM | POA: Diagnosis not present

## 2018-08-08 DIAGNOSIS — M25562 Pain in left knee: Secondary | ICD-10-CM | POA: Diagnosis not present

## 2018-08-08 DIAGNOSIS — S83412A Sprain of medial collateral ligament of left knee, initial encounter: Secondary | ICD-10-CM | POA: Diagnosis not present

## 2018-09-05 ENCOUNTER — Encounter (HOSPITAL_COMMUNITY): Payer: Self-pay | Admitting: Emergency Medicine

## 2018-09-05 ENCOUNTER — Other Ambulatory Visit: Payer: Self-pay

## 2018-09-05 ENCOUNTER — Inpatient Hospital Stay (HOSPITAL_COMMUNITY): Payer: Medicare Other

## 2018-09-05 ENCOUNTER — Encounter (HOSPITAL_COMMUNITY)
Admission: EM | Disposition: A | Discharge status: Home / Self Care | Payer: Self-pay | Source: Home / Self Care | Attending: Internal Medicine

## 2018-09-05 ENCOUNTER — Emergency Department (HOSPITAL_COMMUNITY): Payer: Medicare Other

## 2018-09-05 ENCOUNTER — Inpatient Hospital Stay (HOSPITAL_COMMUNITY): Payer: Medicare Other | Admitting: Certified Registered Nurse Anesthetist

## 2018-09-05 ENCOUNTER — Inpatient Hospital Stay (HOSPITAL_COMMUNITY)
Admission: EM | Admit: 2018-09-05 | Discharge: 2018-09-08 | DRG: 854 | Disposition: A | Discharge status: Home / Self Care | Payer: Medicare Other | Attending: Internal Medicine | Admitting: Internal Medicine

## 2018-09-05 DIAGNOSIS — N179 Acute kidney failure, unspecified: Secondary | ICD-10-CM | POA: Diagnosis present

## 2018-09-05 DIAGNOSIS — N201 Calculus of ureter: Secondary | ICD-10-CM | POA: Diagnosis not present

## 2018-09-05 DIAGNOSIS — D72829 Elevated white blood cell count, unspecified: Secondary | ICD-10-CM

## 2018-09-05 DIAGNOSIS — Z885 Allergy status to narcotic agent status: Secondary | ICD-10-CM

## 2018-09-05 DIAGNOSIS — R110 Nausea: Secondary | ICD-10-CM | POA: Diagnosis not present

## 2018-09-05 DIAGNOSIS — Z6834 Body mass index (BMI) 34.0-34.9, adult: Secondary | ICD-10-CM

## 2018-09-05 DIAGNOSIS — I10 Essential (primary) hypertension: Secondary | ICD-10-CM | POA: Diagnosis present

## 2018-09-05 DIAGNOSIS — Z888 Allergy status to other drugs, medicaments and biological substances status: Secondary | ICD-10-CM

## 2018-09-05 DIAGNOSIS — E860 Dehydration: Secondary | ICD-10-CM

## 2018-09-05 DIAGNOSIS — K529 Noninfective gastroenteritis and colitis, unspecified: Secondary | ICD-10-CM

## 2018-09-05 DIAGNOSIS — K7460 Unspecified cirrhosis of liver: Secondary | ICD-10-CM | POA: Diagnosis not present

## 2018-09-05 DIAGNOSIS — Z20828 Contact with and (suspected) exposure to other viral communicable diseases: Secondary | ICD-10-CM | POA: Diagnosis present

## 2018-09-05 DIAGNOSIS — E785 Hyperlipidemia, unspecified: Secondary | ICD-10-CM | POA: Diagnosis not present

## 2018-09-05 DIAGNOSIS — R197 Diarrhea, unspecified: Secondary | ICD-10-CM | POA: Diagnosis present

## 2018-09-05 DIAGNOSIS — Z8249 Family history of ischemic heart disease and other diseases of the circulatory system: Secondary | ICD-10-CM

## 2018-09-05 DIAGNOSIS — Z881 Allergy status to other antibiotic agents status: Secondary | ICD-10-CM

## 2018-09-05 DIAGNOSIS — N136 Pyonephrosis: Secondary | ICD-10-CM | POA: Diagnosis not present

## 2018-09-05 DIAGNOSIS — N390 Urinary tract infection, site not specified: Secondary | ICD-10-CM | POA: Diagnosis not present

## 2018-09-05 DIAGNOSIS — R0602 Shortness of breath: Secondary | ICD-10-CM | POA: Diagnosis not present

## 2018-09-05 DIAGNOSIS — F419 Anxiety disorder, unspecified: Secondary | ICD-10-CM | POA: Diagnosis present

## 2018-09-05 DIAGNOSIS — J449 Chronic obstructive pulmonary disease, unspecified: Secondary | ICD-10-CM | POA: Diagnosis not present

## 2018-09-05 DIAGNOSIS — A419 Sepsis, unspecified organism: Secondary | ICD-10-CM | POA: Diagnosis not present

## 2018-09-05 DIAGNOSIS — N12 Tubulo-interstitial nephritis, not specified as acute or chronic: Secondary | ICD-10-CM | POA: Diagnosis not present

## 2018-09-05 DIAGNOSIS — E119 Type 2 diabetes mellitus without complications: Secondary | ICD-10-CM | POA: Diagnosis not present

## 2018-09-05 DIAGNOSIS — E1165 Type 2 diabetes mellitus with hyperglycemia: Secondary | ICD-10-CM | POA: Diagnosis present

## 2018-09-05 DIAGNOSIS — Z79899 Other long term (current) drug therapy: Secondary | ICD-10-CM

## 2018-09-05 DIAGNOSIS — N3030 Trigonitis without hematuria: Secondary | ICD-10-CM | POA: Diagnosis not present

## 2018-09-05 DIAGNOSIS — Z7982 Long term (current) use of aspirin: Secondary | ICD-10-CM | POA: Diagnosis not present

## 2018-09-05 DIAGNOSIS — R1111 Vomiting without nausea: Secondary | ICD-10-CM | POA: Diagnosis not present

## 2018-09-05 DIAGNOSIS — R319 Hematuria, unspecified: Secondary | ICD-10-CM

## 2018-09-05 DIAGNOSIS — N281 Cyst of kidney, acquired: Secondary | ICD-10-CM | POA: Diagnosis not present

## 2018-09-05 DIAGNOSIS — N200 Calculus of kidney: Secondary | ICD-10-CM | POA: Diagnosis not present

## 2018-09-05 DIAGNOSIS — B952 Enterococcus as the cause of diseases classified elsewhere: Secondary | ICD-10-CM | POA: Diagnosis not present

## 2018-09-05 DIAGNOSIS — E669 Obesity, unspecified: Secondary | ICD-10-CM | POA: Diagnosis present

## 2018-09-05 DIAGNOSIS — Z96652 Presence of left artificial knee joint: Secondary | ICD-10-CM | POA: Diagnosis present

## 2018-09-05 DIAGNOSIS — R6520 Severe sepsis without septic shock: Secondary | ICD-10-CM

## 2018-09-05 DIAGNOSIS — F329 Major depressive disorder, single episode, unspecified: Secondary | ICD-10-CM | POA: Diagnosis present

## 2018-09-05 DIAGNOSIS — N132 Hydronephrosis with renal and ureteral calculous obstruction: Secondary | ICD-10-CM | POA: Diagnosis not present

## 2018-09-05 DIAGNOSIS — I110 Hypertensive heart disease with heart failure: Secondary | ICD-10-CM | POA: Diagnosis not present

## 2018-09-05 DIAGNOSIS — N3020 Other chronic cystitis without hematuria: Secondary | ICD-10-CM | POA: Diagnosis not present

## 2018-09-05 DIAGNOSIS — Z7984 Long term (current) use of oral hypoglycemic drugs: Secondary | ICD-10-CM

## 2018-09-05 DIAGNOSIS — N3941 Urge incontinence: Secondary | ICD-10-CM | POA: Diagnosis not present

## 2018-09-05 DIAGNOSIS — I5032 Chronic diastolic (congestive) heart failure: Secondary | ICD-10-CM | POA: Diagnosis not present

## 2018-09-05 DIAGNOSIS — N10 Acute tubulo-interstitial nephritis: Secondary | ICD-10-CM | POA: Diagnosis present

## 2018-09-05 DIAGNOSIS — R112 Nausea with vomiting, unspecified: Secondary | ICD-10-CM | POA: Diagnosis not present

## 2018-09-05 DIAGNOSIS — Z87891 Personal history of nicotine dependence: Secondary | ICD-10-CM

## 2018-09-05 HISTORY — DX: Chronic obstructive pulmonary disease, unspecified: J44.9

## 2018-09-05 HISTORY — PX: CYSTOSCOPY W/ URETERAL STENT PLACEMENT: SHX1429

## 2018-09-05 LAB — COMPREHENSIVE METABOLIC PANEL
ALT: 28 U/L (ref 0–44)
AST: 49 U/L — ABNORMAL HIGH (ref 15–41)
Albumin: 4.1 g/dL (ref 3.5–5.0)
Alkaline Phosphatase: 151 U/L — ABNORMAL HIGH (ref 38–126)
Anion gap: 15 (ref 5–15)
BUN: 38 mg/dL — ABNORMAL HIGH (ref 8–23)
CO2: 26 mmol/L (ref 22–32)
Calcium: 11.7 mg/dL — ABNORMAL HIGH (ref 8.9–10.3)
Chloride: 95 mmol/L — ABNORMAL LOW (ref 98–111)
Creatinine, Ser: 1.81 mg/dL — ABNORMAL HIGH (ref 0.44–1.00)
GFR calc Af Amer: 31 mL/min — ABNORMAL LOW (ref >60)
GFR calc non Af Amer: 27 mL/min — ABNORMAL LOW (ref >60)
Glucose, Bld: 172 mg/dL — ABNORMAL HIGH (ref 70–99)
Potassium: 4.4 mmol/L (ref 3.5–5.1)
Sodium: 136 mmol/L (ref 135–145)
Total Bilirubin: 0.9 mg/dL (ref 0.3–1.2)
Total Protein: 9.3 g/dL — ABNORMAL HIGH (ref 6.5–8.1)

## 2018-09-05 LAB — URINALYSIS, ROUTINE W REFLEX MICROSCOPIC
Bilirubin Urine: NEGATIVE
Glucose, UA: NEGATIVE mg/dL
Hgb urine dipstick: NEGATIVE
Ketones, ur: NEGATIVE mg/dL
Nitrite: POSITIVE — AB
Protein, ur: NEGATIVE mg/dL
Specific Gravity, Urine: 1.016 (ref 1.005–1.030)
WBC, UA: >50 WBC/hpf — ABNORMAL HIGH (ref 0–5)
pH: 5 (ref 5.0–8.0)

## 2018-09-05 LAB — CBC
HCT: 43.7 % (ref 36.0–46.0)
Hemoglobin: 13.7 g/dL (ref 12.0–15.0)
MCH: 30.2 pg (ref 26.0–34.0)
MCHC: 31.4 g/dL (ref 30.0–36.0)
MCV: 96.5 fL (ref 80.0–100.0)
Platelets: 525 10*3/uL — ABNORMAL HIGH (ref 150–400)
RBC: 4.53 MIL/uL (ref 3.87–5.11)
RDW: 12.5 % (ref 11.5–15.5)
WBC: 19.7 10*3/uL — ABNORMAL HIGH (ref 4.0–10.5)
nRBC: 0 % (ref 0.0–0.2)

## 2018-09-05 LAB — SARS CORONAVIRUS 2 BY RT PCR (HOSPITAL ORDER, PERFORMED IN ~~LOC~~ HOSPITAL LAB): SARS Coronavirus 2: NEGATIVE

## 2018-09-05 LAB — TROPONIN I (HIGH SENSITIVITY)
Troponin I (High Sensitivity): 6 ng/L (ref <18)
Troponin I (High Sensitivity): 6 ng/L (ref <18)

## 2018-09-05 LAB — LACTIC ACID, PLASMA: Lactic Acid, Venous: 1.8 mmol/L (ref 0.5–1.9)

## 2018-09-05 LAB — LIPASE, BLOOD: Lipase: 46 U/L (ref 11–51)

## 2018-09-05 LAB — GLUCOSE, CAPILLARY: Glucose-Capillary: 135 mg/dL — ABNORMAL HIGH (ref 70–99)

## 2018-09-05 SURGERY — CYSTOSCOPY, WITH RETROGRADE PYELOGRAM AND URETERAL STENT INSERTION
Anesthesia: General | Site: Ureter | Laterality: Left

## 2018-09-05 MED ORDER — ACETAMINOPHEN 650 MG RE SUPP: 650.0000 mg | Freq: Four times a day (QID) | RECTAL | Status: DC | PRN

## 2018-09-05 MED ORDER — SODIUM CHLORIDE 0.9 % IV BOLUS
1000.0000 mL | Freq: Once | INTRAVENOUS | Status: AC
  Administered 2018-09-05: 1000 mL via INTRAVENOUS

## 2018-09-05 MED ORDER — SODIUM CHLORIDE 0.9 % IV SOLN
INTRAVENOUS | Status: DC | PRN
  Administered 2018-09-05: 22:00:00 via INTRAVENOUS

## 2018-09-05 MED ORDER — SODIUM CHLORIDE 0.9 % IV SOLN
1.0000 g | INTRAVENOUS | Status: DC
  Administered 2018-09-06 – 2018-09-07 (×2): 1 g via INTRAVENOUS
  Filled 2018-09-05: qty 10
  Filled 2018-09-05 (×2): qty 1

## 2018-09-05 MED ORDER — ORAL CARE MOUTH RINSE
15.0000 mL | Freq: Two times a day (BID) | OROMUCOSAL | Status: DC
  Administered 2018-09-06 – 2018-09-07 (×4): 15 mL via OROMUCOSAL

## 2018-09-05 MED ORDER — PRAVASTATIN SODIUM 20 MG PO TABS
10.0000 mg | ORAL_TABLET | Freq: Every day | ORAL | Status: DC
  Administered 2018-09-06 – 2018-09-08 (×3): 10 mg via ORAL
  Filled 2018-09-05 (×3): qty 1

## 2018-09-05 MED ORDER — ONDANSETRON HCL 4 MG/2ML IJ SOLN: 4.0000 mg | Freq: Once | INTRAMUSCULAR | Status: DC | PRN

## 2018-09-05 MED ORDER — ONDANSETRON HCL 4 MG/2ML IJ SOLN
4.0000 mg | Freq: Once | INTRAMUSCULAR | Status: DC
  Filled 2018-09-05: qty 2

## 2018-09-05 MED ORDER — PROPOFOL 10 MG/ML IV BOLUS
INTRAVENOUS | Status: AC
  Filled 2018-09-05: qty 20

## 2018-09-05 MED ORDER — FENTANYL CITRATE (PF) 100 MCG/2ML IJ SOLN: 25.0000 ug | INTRAMUSCULAR | Status: DC | PRN

## 2018-09-05 MED ORDER — LIDOCAINE 2% (20 MG/ML) 5 ML SYRINGE
INTRAMUSCULAR | Status: DC | PRN
  Administered 2018-09-05: 60 mg via INTRAVENOUS

## 2018-09-05 MED ORDER — SODIUM CHLORIDE 0.9 % IR SOLN
Status: DC | PRN
  Administered 2018-09-05: 3000 mL

## 2018-09-05 MED ORDER — HYDROCODONE-ACETAMINOPHEN 5-325 MG PO TABS: 1.0000 | ORAL_TABLET | ORAL | Status: DC | PRN

## 2018-09-05 MED ORDER — PROPOFOL 10 MG/ML IV BOLUS
INTRAVENOUS | Status: DC | PRN
  Administered 2018-09-05: 150 mg via INTRAVENOUS

## 2018-09-05 MED ORDER — PHENYLEPHRINE 40 MCG/ML (10ML) SYRINGE FOR IV PUSH (FOR BLOOD PRESSURE SUPPORT)
PREFILLED_SYRINGE | INTRAVENOUS | Status: AC
  Filled 2018-09-05: qty 10

## 2018-09-05 MED ORDER — SODIUM CHLORIDE 0.9 % IV SOLN
INTRAVENOUS | Status: AC
  Administered 2018-09-06: 01:00:00 via INTRAVENOUS

## 2018-09-05 MED ORDER — INSULIN ASPART 100 UNIT/ML ~~LOC~~ SOLN
0.0000 [IU] | SUBCUTANEOUS | Status: DC
  Administered 2018-09-06: 01:00:00 1 [IU] via SUBCUTANEOUS
  Administered 2018-09-06 (×2): 2 [IU] via SUBCUTANEOUS
  Administered 2018-09-06 – 2018-09-07 (×6): 1 [IU] via SUBCUTANEOUS

## 2018-09-05 MED ORDER — SODIUM CHLORIDE 0.9 % IV SOLN
2.0000 g | Freq: Once | INTRAVENOUS | Status: AC
  Administered 2018-09-05: 2 g via INTRAVENOUS
  Filled 2018-09-05: qty 20

## 2018-09-05 MED ORDER — ACETAMINOPHEN 325 MG PO TABS
650.0000 mg | ORAL_TABLET | Freq: Four times a day (QID) | ORAL | Status: DC | PRN
  Administered 2018-09-07 – 2018-09-08 (×3): 650 mg via ORAL
  Filled 2018-09-05 (×3): qty 2

## 2018-09-05 MED ORDER — CHLORHEXIDINE GLUCONATE CLOTH 2 % EX PADS
6.0000 | MEDICATED_PAD | Freq: Every day | CUTANEOUS | Status: DC
  Administered 2018-09-06: 22:00:00 6 via TOPICAL

## 2018-09-05 MED ORDER — MEPERIDINE HCL 50 MG/ML IJ SOLN: 6.2500 mg | INTRAMUSCULAR | Status: DC | PRN

## 2018-09-05 MED ORDER — DULOXETINE HCL 60 MG PO CPEP
60.0000 mg | ORAL_CAPSULE | Freq: Every day | ORAL | Status: DC
  Administered 2018-09-06 – 2018-09-08 (×3): 60 mg via ORAL
  Filled 2018-09-05: qty 2
  Filled 2018-09-05 (×2): qty 1

## 2018-09-05 MED ORDER — CARVEDILOL 6.25 MG PO TABS: 6.2500 mg | ORAL_TABLET | Freq: Two times a day (BID) | ORAL | Status: DC

## 2018-09-05 MED ORDER — FENTANYL CITRATE (PF) 100 MCG/2ML IJ SOLN
INTRAMUSCULAR | Status: AC
  Filled 2018-09-05: qty 2

## 2018-09-05 MED ORDER — ONDANSETRON HCL 4 MG/2ML IJ SOLN: 4.0000 mg | Freq: Four times a day (QID) | INTRAMUSCULAR | Status: DC | PRN

## 2018-09-05 MED ORDER — FENTANYL CITRATE (PF) 100 MCG/2ML IJ SOLN
INTRAMUSCULAR | Status: DC | PRN
  Administered 2018-09-05: 50 ug via INTRAVENOUS

## 2018-09-05 MED ORDER — PHENYLEPHRINE 40 MCG/ML (10ML) SYRINGE FOR IV PUSH (FOR BLOOD PRESSURE SUPPORT)
PREFILLED_SYRINGE | INTRAVENOUS | Status: DC | PRN
  Administered 2018-09-05: 120 ug via INTRAVENOUS

## 2018-09-05 MED ORDER — ONDANSETRON HCL 4 MG PO TABS: 4.0000 mg | ORAL_TABLET | Freq: Four times a day (QID) | ORAL | Status: DC | PRN

## 2018-09-05 SURGICAL SUPPLY — 17 items
BAG URINE DRAINAGE (UROLOGICAL SUPPLIES) ×2 IMPLANT
BAG URO CATCHER STRL LF (MISCELLANEOUS) ×3 IMPLANT
CATH FOLEY 2WAY SLVR  5CC 16FR (CATHETERS) ×2
CATH FOLEY 2WAY SLVR 5CC 16FR (CATHETERS) ×1 IMPLANT
CATH URET 5FR 28IN OPEN ENDED (CATHETERS) ×2 IMPLANT
CLOTH BEACON ORANGE TIMEOUT ST (SAFETY) ×3 IMPLANT
COVER WAND RF STERILE (DRAPES) IMPLANT
GLOVE SURG SS PI 8.0 STRL IVOR (GLOVE) ×2 IMPLANT
GOWN STRL REUS W/TWL XL LVL3 (GOWN DISPOSABLE) ×3 IMPLANT
GUIDEWIRE STR DUAL SENSOR (WIRE) ×3 IMPLANT
KIT TURNOVER KIT A (KITS) IMPLANT
MANIFOLD NEPTUNE II (INSTRUMENTS) ×3 IMPLANT
PACK CYSTO (CUSTOM PROCEDURE TRAY) ×3 IMPLANT
STENT URET 6FRX24 CONTOUR (STENTS) ×2 IMPLANT
TUBING CONNECTING 10 (TUBING) ×2 IMPLANT
TUBING CONNECTING 10' (TUBING) ×1
TUBING UROLOGY SET (TUBING) IMPLANT

## 2018-09-05 NOTE — H&P (Signed)
Shannon Lynch YYT:[id number] DOB: 1945-06-29 DOA: 09/05/2018     PCP: Thressa Sheller, MD (Inactive)   Outpatient Specialists:   CARDS:   Dr.Skains Orth[facility] Dr. Alvan [facility]   Patient arrived to ER on 09/05/18 at 1353  Patient coming from: home Lives  With family husband has Dementia   Chief Complaint:  Chief Complaint  Patient presents with  . Emesis  . Diarrhea    HPI: Shannon Lynch is a 73 y.o. female with medical history significant of HTN, DM2, COPD, recurrent chronic UTI, diastolic CHF  ex-smoker   Presented with nausea vomiting and occasional diarrhea for the past 3 weeks getting worse the past few days seems like she is not able to tolerate p.o. intake she called her primary care provider who provided prescription for Zofran with some improvement now she is having mostly dry heaves no hematemesis Occasional loose stools soft no chest pain less no abdominal pain  Denies any fever or chills no cough no shortness of breath   Infectious risk factors:  Reports  N/V/Diarrhea  In  ER RAPID COVID TEST NEGATIVE    Regarding pertinent Chronic problems:    Hyperlipidemia -  on statins, Pravastatin   HTN on coreg, Cozaar   CHF diastolic  - last echo 27/51/70 Mod LVH, EF 55-60%, no RWMA, Gr 1 DD, mild to mod MAC  Nuclear Stress Test (02/14/14):  No ischemia, EF 63%, Normal Study   DM 2 -  Lab Results  Component Value Date   HGBA1C 6.9 (H) 01/22/2014   PO meds only,       Morbid obesity-   BMI Readings from Last 1 Encounters:  02/21/14 41.99 kg/m      COPD - not followed by pulmonology not on baseline oxygen ,    While in ER:  The following Work up has been ordered so far:  Orders Placed This Encounter  Procedures  . SARS Coronavirus 2 [facility] order, Performed in [facility] hospital lab) Nasopharyngeal Nasopharyngeal Swab  . Blood culture (routine x 2)  . Gastrointestinal Panel by PCR , Stool  . Urine Culture  . DG Chest  Portable 1 View  . CT ABDOMEN PELVIS WO CONTRAST  . Lipase, blood  . Comprehensive metabolic panel  . CBC  . Urinalysis, Routine w reflex microscopic  . Lactic acid, plasma  . APTT  . Protime-INR  . Procalcitonin  . Diet NPO time specified  . Saline Lock IV, Maintain IV access  . If lactate (lactic acid) >2, verify repeat lactic acid order has been placed to be drawn  . Document vital signs within 1-hour of fluid bolus completion and notify provider of bolus completion  . Vital signs  . Vital signs  . Refer to Sidebar Report: Sepsis Sidebar  . Cardiac monitoring  . Informed Consent Details: Transcribe to consent form and obtain patient signature  . Place and maintain sequential compression device  . Consult to hospitalist  ALL PATIENTS BEING ADMITTED/HAVING PROCEDURES NEED COVID-19 SCREENING  . Pharmacy Consult  . Consult to urology  ALL PATIENTS BEING ADMITTED/HAVING PROCEDURES NEED COVID-19 SCREENING  . Enteric precautions (UV disinfection) C difficile, Norovirus  . ED EKG  . EKG 12-Lead  . Admit to Inpatient (patient's expected length of stay will be greater than 2 midnights or inpatient only procedure)     Following Medications were ordered in ER: Medications  ondansetron (ZOFRAN) injection 4 mg (has no administration in time range)  cefTRIAXone (ROCEPHIN) 2 g  in sodium chloride 0.9 % 100 mL IVPB (has no administration in time range)  cefTRIAXone (ROCEPHIN) 1 g in sodium chloride 0.9 % 100 mL IVPB (has no administration in time range)  sodium chloride 0.9 % bolus 1,000 mL (0 mLs Intravenous Stopped 09/05/18 1855)        Consult Orders  (From admission, onward)         Start     Ordered   09/05/18 1955  Consult to hospitalist  ALL PATIENTS BEING ADMITTED/HAVING PROCEDURES NEED COVID-19 SCREENING  Once    Comments: ALL PATIENTS BEING ADMITTED/HAVING PROCEDURES NEED COVID-19 SCREENING  Provider:  (Not yet assigned)  Question Answer Comment  Place call to: [facility]   Reason for Consult Admit      09/05/18 1954          ER Provider Called:  Urology   Dr.Wrenn They Recommend admit to medicine   Will see   in ER   Significant initial  Findings: Abnormal Labs Reviewed  COMPREHENSIVE METABOLIC PANEL - Abnormal; Notable for the following components:      Result Value   Chloride 95 (*)    Glucose, Bld 172 (*)    BUN 38 (*)    Creatinine, Ser 1.81 (*)    Calcium 11.7 (*)    Total Protein 9.3 (*)    AST 49 (*)    Alkaline Phosphatase 151 (*)    GFR calc non Af Amer 27 (*)    GFR calc Af Amer 31 (*)    All other components within normal limits  CBC - Abnormal; Notable for the following components:   WBC 19.7 (*)    Platelets 525 (*)    All other components within normal limits  URINALYSIS, ROUTINE W REFLEX MICROSCOPIC - Abnormal; Notable for the following components:   APPearance CLOUDY (*)    Nitrite POSITIVE (*)    Leukocytes,Ua LARGE (*)    WBC, UA >50 (*)    Bacteria, UA MANY (*)    All other components within normal limits     Otherwise labs showing:    Recent Labs  Lab 09/05/18 1546  NA 136  K 4.4  CO2 26  GLUCOSE 172*  BUN 38*  CREATININE 1.81*  CALCIUM 11.7*    Cr  Up from baseline see below Lab Results  Component Value Date   CREATININE 1.81 (H) 09/05/2018   CREATININE 0.73 12/13/2015   CREATININE 0.81 02/21/2014    Recent Labs  Lab 09/05/18 1546  AST 49*  ALT 28  ALKPHOS 151*  BILITOT 0.9  PROT 9.3*  ALBUMIN 4.1   Lab Results  Component Value Date   CALCIUM 11.7 (H) 09/05/2018   PHOS 2.5 01/22/2014     WBC      Component Value Date/Time   WBC 19.7 (H) 09/05/2018 1546   ANC    Component Value Date/Time   NEUTROABS 10.0 (H) 01/24/2014 1156   ALC No results found for: LYMPHOABS    Plt: Lab Results  Component Value Date   PLT 525 (H) 09/05/2018       COVID-19 Labs     Lab Results  Component Value Date   SARSCOV2NAA NEGATIVE 09/05/2018     ABG    Component  Value Date/Time   HCO3 28.7 (H) 02/06/2011 2255   TCO2 21 01/21/2014 1551   O2SAT 49.1 02/06/2011 2255      HG/HCT  stable,       Component Value Date/Time  HGB 13.7 09/05/2018 1546   HCT 43.7 09/05/2018 1546    Recent Labs  Lab 09/05/18 1546  LIPASE 46   No results for input(s): AMMONIA in the last 168 hours.  No components found for: LABALBU    UA  evidence of UTI     Urine analysis:    Component Value Date/Time   COLORURINE YELLOW 09/05/2018 1852   APPEARANCEUR CLOUDY (A) 09/05/2018 1852   LABSPEC 1.016 09/05/2018 1852   PHURINE 5.0 09/05/2018 1852   GLUCOSEU NEGATIVE 09/05/2018 1852   HGBUR NEGATIVE 09/05/2018 1852   HGBUR negative 02/06/2010 1112   BILIRUBINUR NEGATIVE 09/05/2018 1852   BILIRUBINUR n 06/25/2010   KETONESUR NEGATIVE 09/05/2018 1852   PROTEINUR NEGATIVE 09/05/2018 1852   UROBILINOGEN 1.0 01/21/2014 1827   NITRITE POSITIVE (A) 09/05/2018 1852   LEUKOCYTESUR LARGE (A) 09/05/2018 1852       CXR -  NON acute  CTabd/pelvis -  Left renal stone Cirrhosis    ECG:  Personally reviewed by me showing: HR :114 Rhythm:   Sinus tachycardia   , no evidence of ischemic changes QTC 402       ED Triage Vitals  Enc Vitals Group     BP 09/05/18 1405 138/84     Pulse Rate 09/05/18 1405 (!) 117     Resp 09/05/18 1405 20     Temp 09/05/18 1405 98.1 F (36.7 C)     Temp Source 09/05/18 1405 Oral     SpO2 09/05/18 1405 92 %     Weight --      Height --      Head Circumference --      Peak Flow --      Pain Score 09/05/18 1400 0     Pain Loc --      Pain Edu? --      Excl. in [place]? --   TMAX(24)@       Latest  Blood pressure (!) 147/95, pulse (!) 111, temperature 98.1 F (36.7 C), temperature source Oral, resp. rate (!) 24, last menstrual period 06/23/1995, SpO2 91 %.     Hospitalist was called for admission for urinary tract infection in the setting of ureteral stone resulting in sepsis   Review of Systems:    Pertinent positives  include: nausea, vomiting, diarrhea,   Constitutional:  No weight loss, night sweats, Fevers, chills, fatigue, weight loss  HEENT:  No headaches, Difficulty swallowing,Tooth/dental problems,Sore throat,  No sneezing, itching, ear ache, nasal congestion, post nasal drip,  Cardio-vascular:  No chest pain, Orthopnea, PND, anasarca, dizziness, palpitations.no Bilateral lower extremity swelling  GI:  No heartburn, indigestion, abdominal pain, change in bowel habits, loss of appetite, melena, blood in stool, hematemesis Resp:  no shortness of breath at rest. No dyspnea on exertion, No excess mucus, no productive cough, No non-productive cough, No coughing up of blood.No change in color of mucus.No wheezing. Skin:  no rash or lesions. No jaundice GU:  no dysuria, change in color of urine, no urgency or frequency. No straining to urinate.  No flank pain.  Musculoskeletal:  No joint pain or no joint swelling. No decreased range of motion. No back pain.  Psych:  No change in mood or affect. No depression or anxiety. No memory loss.  Neuro: no localizing neurological complaints, no tingling, no weakness, no double vision, no gait abnormality, no slurred speech, no confusion  All systems reviewed and apart from [place] all are negative  Past Medical History:   Past  Medical History:  Diagnosis Date  . Anxiety   . Arthritis   . Chronic UTI (urinary tract infection)   . COPD (chronic obstructive pulmonary disease) ([place])   . Depression   . Diabetes mellitus without complication ([place])   . Exogenous obesity   . Hx of cardiovascular stress test    a. Nuclear Stress Test (02/14/14):  No ischemia, EF 63%, Normal Study  . Hx of echocardiogram    a. Echocardiogram (01/22/14):  Mod LVH, EF 55-60%, no RWMA, Gr 1 DD, mild to mod MAC  . Hypertension   . Osteonecrosis [facility])       Past Surgical History:  Procedure Laterality Date  . cataract surgery     . FINGER SURGERY     related to tumors on  finger removal   . TOTAL KNEE ARTHROPLASTY    . TOTAL KNEE ARTHROPLASTY Left 09/26/2013   Procedure: LEFT TOTAL KNEE ARTHROPLASTY;  Surgeon: Mauri Pole, MD;  Location: [facility] ORS;  Service: Orthopedics;  Laterality: Left;  . TUBAL LIGATION      Social History:  Ambulatory  walker      reports that she quit smoking about 8 years ago. Her smoking use included cigarettes. She has never used smokeless tobacco. She reports that she does not drink alcohol or use drugs.     Family History:  Family History  Problem Relation Age of Onset  . Emphysema Father   . Heart attack Paternal Aunt     Allergies: Allergies  Allergen Reactions  . Ciprofloxacin Hives  . Codeine Nausea And Vomiting  . Prednisone Other (See Comments)    Elevated glucose level to 700     Prior to Admission medications   Medication Sig Start Date End Date Taking? Authorizing Provider  acetaminophen (TYLENOL) 325 MG tablet Take 2 tablets (650 mg total) by mouth every 6 (six) hours as needed for mild pain (or Fever >/= 101). 09/27/13  Yes Babish, Dickie La  aspirin EC 81 MG tablet Take 81 mg by mouth daily.   Yes [provider]  carvedilol (COREG) 6.25 MG tablet Take 1 tablet (6.25 mg total) by mouth 2 (two) times daily with a meal. 02/21/14  Yes Weaver, Scott T, PA-C  cholecalciferol (VITAMIN D) 1000 UNITS tablet Take 1,000 Units by mouth daily.   Yes [provider]  clorazepate (TRANXENE) 3.75 MG tablet Take 3.75 mg by mouth 2 (two) times daily.    Yes [provider]  CRANBERRY PO Take 1 tablet by mouth 2 (two) times daily.   Yes [provider]  DULoxetine (CYMBALTA) 60 MG capsule Take 60 mg by mouth daily. 12/05/15  Yes [provider]  losartan (COZAAR) 100 MG tablet Take 100 mg by mouth every morning.   Yes [provider]  metFORMIN (GLUCOPHAGE) 500 MG tablet Take 1,000 mg by mouth daily.   Yes [provider]  ondansetron (ZOFRAN) 8 MG tablet  Take 8 mg by mouth every 8 (eight) hours as needed for nausea or vomiting.   Yes [provider]  polyvinyl alcohol (LIQUIFILM TEARS) 1.4 % ophthalmic solution Place 1 drop into both eyes 3 (three) times daily as needed for dry eyes.   Yes [provider]  pravastatin (PRAVACHOL) 10 MG tablet Take 10 mg by mouth daily. 11/28/15  Yes [provider]  vitamin C (ASCORBIC ACID) 500 MG tablet Take 500 mg by mouth 2 (two) times daily.   Yes [provider]   Physical Exam:  Blood pressure (!) 147/95, pulse (!) 111, temperature 98.1 F (36.7 C), temperature source Oral, resp. rate (!) 24, last menstrual period 06/23/1995, SpO2 91 %. 1. General:  in No Acute distress   Chronically ill -appearing 2. Psychological: Alert and  Oriented 3. Head/ENT:  Dry Mucous Membranes                          Head Non traumatic, neck supple                          Poor Dentition 4. SKIN:decreased Skin turgor,  Skin clean Dry and intact no rash 5. Heart: Regular rate and rhythm no Murmur, no Rub or gallop 6. Lungs: , no wheezes or crackles   7. Abdomen: Soft non-tender, Non distended obese bowel sounds present 8. Lower extremities: no clubbing, cyanosis, no edema 9. Neurologically Grossly intact, moving all 4 extremities equally   10. MSK: Normal range of motion   All other LABS:     Recent Labs  Lab 09/05/18 1546  WBC 19.7*  HGB 13.7  HCT 43.7  MCV 96.5  PLT 525*     Recent Labs  Lab 09/05/18 1546  NA 136  K 4.4  CL 95*  CO2 26  GLUCOSE 172*  BUN 38*  CREATININE 1.81*  CALCIUM 11.7*     Recent Labs  Lab 09/05/18 1546  AST 49*  ALT 28  ALKPHOS 151*  BILITOT 0.9  PROT 9.3*  ALBUMIN 4.1       Cultures:    Component Value Date/Time   SDES BLOOD LEFT ANTECUBITAL 01/22/2014 0030   SPECREQUEST BOTTLES DRAWN AEROBIC AND ANAEROBIC 10CC 01/22/2014 0030   CULT  01/22/2014 0030    NO GROWTH 5 DAYS Performed at [place]  01/28/2014 FINAL 01/22/2014 0030     Radiological Exams on Admission: Ct Abdomen Pelvis Wo Contrast  Result Date: 09/05/2018 CLINICAL DATA:  Nausea, vomiting and diarrhea for 3 weeks. Abdominal cramping. EXAM: CT ABDOMEN AND PELVIS WITHOUT CONTRAST TECHNIQUE: Multidetector CT imaging of the abdomen and pelvis was performed following the standard protocol without IV contrast. COMPARISON:  None. FINDINGS: Lower chest: Elevation of the left hemidiaphragm with overlying vascular crowding and atelectasis. Right basilar scarring changes also noted. The heart is normal in size. No pericardial effusion. Advanced three-vessel coronary artery calcifications are noted. The distal esophagus is grossly normal. Hepatobiliary: Advanced cirrhotic changes involving the liver but no focal hepatic lesion is identified without contrast. Periportal lymph nodes are noted and are typical with cirrhosis. The gallbladder appears normal. No common bile duct dilatation. Pancreas: No mass, inflammation or ductal dilatation. Spleen: Normal sized.  No focal lesions. Adrenals/Urinary Tract: The adrenal glands are unremarkable. There are several left renal calculi with a 12 x 12 mm calculus noted in the renal pelvis. There is mild to moderate hydronephrosis and this calculus could be intermittently obstructing near the UPJ. Mild perinephric stranding. No right-sided renal calculi small right renal cysts. No obstructing left or right ureteral calculi. No bladder calculi. No worrisome renal or bladder lesions without contrast. Stomach/Bowel: The stomach, duodenum, small bowel and colon are grossly normal without oral contrast. No acute inflammatory changes, mass lesions or obstructive findings. The terminal ileum and appendix are normal. Vascular/Lymphatic: Advanced atherosclerotic calcifications involving the aorta and branch vessel ostia, in particularly the left renal artery is heavily calcified at its origin. No focal aneurysm.  Scattered  retroperitoneal lymph nodes may be related to the patient's cirrhosis. Reproductive: The uterus and ovaries are unremarkable. Other: No pelvic mass or free pelvic fluid collections. No pelvic adenopathy. A few scattered pelvic lymph nodes are noted. Musculoskeletal: No significant bony findings. IMPRESSION: 1. Left-sided renal calculi with a 12 mm calculus in the renal pelvis which could be intermittently obstructing near the UPJ. There is mild to moderate left hydronephrosis. 2. Advanced cirrhotic changes involving the liver without obvious hepatic lesions without contrast. Periportal and retroperitoneal lymph nodes are likely related to the patient's cirrhosis. 3. No acute abdominal/pelvic findings, mass lesions or overt lymphadenopathy. 4. Advanced atherosclerotic calcifications involving the aorta and branch vessels, in particular, the left renal artery at its origin and coronary arteries. Electronically Signed   By: Marijo Sanes M.D.   On: 09/05/2018 17:34   Dg Chest Portable 1 View  Result Date: 09/05/2018 CLINICAL DATA:  Shortness of breath EXAM: PORTABLE CHEST 1 VIEW COMPARISON:  12/13/2015 FINDINGS: Cardiac shadow is stable. Aortic calcifications are again seen. No acute infiltrate or sizable effusion is seen. No bony abnormality is noted. IMPRESSION: No active disease. Electronically Signed   By: Inez Catalina M.D.   On: 09/05/2018 17:21    Chart has been reviewed    Assessment/Plan   73 y.o. female with medical history significant of HTN, DM2, COPD, recurrent chronic UTI, diastolic CHF  ex-smoker    Admitted for   urinary tract infection in the setting of renal stone resulting in sepsis  Present on Admission: . Sepsis ([place]) -   -Patient meets sepsis criteria with  leukocytosis  Tachycardia    Source most likely:  UTI,  -We will rehydrate, treat with IV antibiotics, follow lactic acid - Await results of blood and urine culture and adjust antibiotics as needed   . Pyelonephritis -  -  treat with Rocephin        await results of urine culture and adjust antibiotic coverage as needed  . renal stone - with hydronephrosis -appreciate urology consult plan for stent placement tonight keep n.p.o. continue IV antibiotics await results of urine culture given tachycardia and AKI potential of patient decompensation will monitor overnight in stepdown  . Essential hypertension -hold home medications for tonight restart Coreg when able hold ARB given AKI  . Dehydration we will rehydrate and continue to follow . AKI (acute kidney injury) ([place])  . Diarrhea obtain gastric panel supportive management no recent antibiotic exposure  Dm 2-  - Order Sensitive   SSI   -  check TSH and HgA1C  - Hold by mouth medications   History of COPD chronic stable   History of diastolic CHF currently appears to be on the dry side gently rehydrate and follow fluid status  Other plan as per orders.  DVT prophylaxis:  SCD    Code Status:  FULL CODE as per patient I had personally discussed CODE STATUS with patient   Family Communication:   Family not at  Bedside    Disposition Plan:    To home once workup is complete and patient is stable                    Would benefit from PT/OT eval prior to DC  Ordered                                      Consults  called: urology     Admission status:  ED Disposition    ED Disposition Condition [place] [facility]: [place] [[id number]]  Level of Care: Stepdown [14]  Admit to SDU based on following criteria: Hemodynamic compromise or significant risk of instability:  Patient requiring short term acute titration and management of vasoactive drips, and invasive monitoring (i.e., CVP and Arterial line).  Covid Evaluation: Confirmed COVID Negative  Diagnosis: Pyelonephritis [[id number]]  Admitting Physician: Toy Baker [[id number]]  Attending Physician: Toy Baker [[id number]]  Estimated length of stay: 3 - 4 days   Certification:: I certify this patient will need inpatient services for at least 2 midnights  PT Class (Do Not Modify): Inpatient [101]  PT Acc Code (Do Not Modify): Private [1]         inpatient     Expect 2 midnight stay secondary to severity of patient's current illness including   hemodynamic instability despite optimal treatment (tachycardia  )   Severe lab/radiological/exam abnormalities including: AKI, renal stone no hydronephrosis and extensive comorbidities including: DM2 ,  CHF COPD    That are currently affecting medical management.   I expect  patient to be hospitalized for 2 midnights requiring inpatient medical care.  Patient is at high risk for adverse outcome (such as loss of life or disability) if not treated.  Indication for inpatient stay as follows:    Hemodynamic instability despite maximal medical therapy,      inability to maintain oral hydration    Need for operative/procedural  intervention    Need for IV antibiotics, IV fluids,          Level of care     SDU tele indefinitely please discontinue once patient no longer qualifies  Precautions: enteric    PPE: Used by the provider:   P100  eye Goggles,  Gloves          Kirstine Jacquin 09/05/2018, 9:16 PM    [facility]     after 2 AM please page floor coverage PA If 7AM-7PM, please contact the day team taking care of the patient using [email or web address]

## 2018-09-05 NOTE — ED Notes (Signed)
Pt aware that urine sample is needed.  

## 2018-09-05 NOTE — ED Triage Notes (Signed)
Per [facility] pt from home for n/v/d for 3 weeks. PCP prescribed her nausea meds which helped some, but diarrhea has persisted. Vitals: 140/92, 100HR, CBg 147

## 2018-09-05 NOTE — Transfer of Care (Signed)
Immediate Anesthesia Transfer of Care Note  Patient: Shannon Lynch  Procedure(s) Performed: CYSTOSCOPY WITH URETERAL STENT PLACEMENT (Left Ureter)  Patient Location: PACU  Anesthesia Type:General  Level of Consciousness: awake, alert  and oriented  Airway & Oxygen Therapy: Patient Spontanous Breathing and Patient connected to face mask oxygen  Post-op Assessment: Report given to RN and Post -op Vital signs reviewed and stable  Post vital signs: Reviewed and stable  Last Vitals:  Vitals Value Taken Time  BP    Temp    Pulse 107 09/05/18 2243  Resp 23 09/05/18 2243  SpO2 100 % 09/05/18 2243  Vitals shown include unvalidated device data.  Last Pain:  Vitals:   09/05/18 1405  TempSrc: Oral  PainSc:          Complications: No apparent anesthesia complications

## 2018-09-05 NOTE — Anesthesia Procedure Notes (Signed)
Procedure Name: LMA Insertion Performed by: Gean Maidens, CRNA Pre-anesthesia Checklist: Patient identified, Emergency Drugs available, Suction available, Patient being monitored and Timeout performed Patient Re-evaluated:Patient Re-evaluated prior to induction Oxygen Delivery Method: Circle system utilized Preoxygenation: Pre-oxygenation with 100% oxygen Ventilation: Mask ventilation without difficulty LMA: LMA inserted LMA Size: 4.0 Number of attempts: 1 Placement Confirmation: positive ETCO2 and breath sounds checked- equal and bilateral Tube secured with: Tape Dental Injury: Teeth and Oropharynx as per pre-operative assessment

## 2018-09-05 NOTE — Op Note (Signed)
Procedure: Cystoscopy with insertion of left double-J stent.  Preop diagnosis: Left renal pelvic stone with obstruction and sepsis.  Postop diagnosis: Same with follicular cystitis.  Surgeon: Dr. Irine Seal.  Anesthesia: General.  Specimen: Culture from left renal pelvis.  Drain: 6 [place] by 24 cm left contour double-J stent and 16 French Foley catheter.  EBL: None.  Complications: None.  Indications: The patient is a 73 year old female who presented with a 3-week history of nausea, vomiting and diarrhea.  She was found to have a leukocytosis and AK I on her ER labs and a urine consistent with infection.  A CT of the abdomen and pelvis demonstrated a 12 mm left renal pelvic stone with obstruction and 2 smaller lower pole stones.  It was felt that cystoscopy and stenting was indicated.  Because of the limited degree of hydronephrosis and her base the percutaneous nephrostomy tube placement would be more difficult.  Procedure: She was given 2 g of Rocephin in the emergency room.  She was taken the operating room where general anesthetic was induced.  She was placed in lithotomy position and fitted with PAS hose.  Her perineum and genitalia were prepped with Betadine solution and she was draped in usual sterile fashion.  Cystoscopy was performed using a 23 [place] scope and 30 degree lens.  Examination revealed a normal urethra.  The bladder wall had mild trabeculation.  There were mucosal changes consistent with follicular cystitis but no tumors or stones were seen.  The ureteral orifices were unremarkable.  The left ureteral orifice was cannulated with a 5 French opening catheter which was passed to the level of the stone.  A sensor guidewire was passed by the stone into the upper pole and the open-ended catheter was passed over the wire into the upper pole.  The wire was removed and she was noted to have a hydronephrotic drip of purulent urine which was collected to send for culture.  The wire  was replaced through the ureteral catheter which was then removed.  Purulent urine effluxed around the wire.  A 6 French by 24 cm contour double-J stent was passed to the kidney under fluoroscopic guidance over the wire.  The wire was removed, leaving a good coil in the kidney and a good coil in the bladder.  The cystoscope was removed and a 28 French Foley catheter was placed to straight drainage.  She was taken down from the lithotomy position, her anesthetic was reversed and she was moved recovery room in stable condition.  There were no complications

## 2018-09-05 NOTE — Anesthesia Preprocedure Evaluation (Addendum)
Anesthesia Evaluation  Patient identified by MRN, date of birth, ID band Patient awake    Reviewed: Allergy & Precautions, NPO status , Patient's Chart, lab work & pertinent test results, reviewed documented beta blocker date and time   Airway Mallampati: II  TM Distance: >3 FB Neck ROM: Full    Dental  (+) Poor Dentition, Chipped, Missing, Dental Advisory Given   Pulmonary shortness of breath and with exertion, COPD,  COPD inhaler, former smoker,    Pulmonary exam normal breath sounds clear to auscultation + decreased breath sounds      Cardiovascular hypertension, Pt. on medications and Pt. on home beta blockers Normal cardiovascular exam Rhythm:Regular Rate:Tachycardia  EKG 09/05/2018 ST, [facility]  Echo 01/22/2014 Left ventricle: The cavity size was normal. Wall thickness was increased in a pattern of moderate LVH. There was focal basal hypertrophy. Systolic function was normal. The estimated ejection fraction was in the range of 55% to 60%. Wall motion was normal; there were no regional wall motion abnormalities. Dopplerparameters are consistent with abnormal left ventricular relaxation (grade 1 diastolic dysfunction).  - Mitral valve: Mildly to moderately calcified annulus.      Neuro/Psych PSYCHIATRIC DISORDERS Anxiety Depression negative neurological ROS     GI/Hepatic negative GI ROS, Neg liver ROS,   Endo/Other  diabetes, Poorly Controlled, Type 2, Oral Hypoglycemic AgentsMorbid obesityHyperlipidemia  Renal/GU Renal InsufficiencyRenal diseaseLeft ureteral calculus with obstruction Pyelonephritis  negative genitourinary   Musculoskeletal  (+) Arthritis , Osteoarthritis,    Abdominal (+) + obese,   Peds  Hematology   Anesthesia Other Findings   Reproductive/Obstetrics                            Anesthesia Physical Anesthesia Plan  ASA: III and emergent  Anesthesia Plan: General    Post-op Pain Management:    Induction: Intravenous  PONV Risk Score and Plan: 4 or greater and Ondansetron, Dexamethasone and Treatment may vary due to age or medical condition  Airway Management Planned: LMA  Additional Equipment:   Intra-op Plan:   Post-operative Plan: Extubation in OR  Informed Consent: I have reviewed the patients History and Physical, chart, labs and discussed the procedure including the risks, benefits and alternatives for the proposed anesthesia with the patient or authorized representative who has indicated his/her understanding and acceptance.     Dental advisory given  Plan Discussed with: CRNA and Surgeon  Anesthesia Plan Comments:         Anesthesia Quick Evaluation

## 2018-09-05 NOTE — ED Provider Notes (Signed)
[facility] DEPT Provider Note   CSN: [id number] Arrival date & time: 09/05/18  1353     History   Chief Complaint Chief Complaint  Patient presents with  . Emesis  . Diarrhea    HPI NELA BASCOM is a 73 y.o. female.  Presents emergency room with chief complaint nausea, vomiting.  States does have been going on for approximately 3 weeks, has been relatively constant but over the past few days seem to have been worsening.  States she has been unable to eat or drink anything of significance lately due to the severe symptoms.  Her primary doctor provided prescription for Zofran which has provided moderate relief.  Has not taken any of that today.  States now she is having mostly dry heaves episodes.  No hematemesis.  Has had occasional loose stools.  Nonbloody.  Initially seem to be somewhat watery but lately has not had any sort of watery diarrhea.  She denies any associated abdominal pain.  Does have some abdominal discomfort when she has her dry heaving.  No associated fever, cough, difficulty breathing, chest pain.     HPI  Past Medical History:  Diagnosis Date  . Anxiety   . Arthritis   . Chronic UTI (urinary tract infection)   . COPD (chronic obstructive pulmonary disease) ([place])   . Depression   . Diabetes mellitus without complication ([place])   . Exogenous obesity   . Hx of cardiovascular stress test    a. Nuclear Stress Test (02/14/14):  No ischemia, EF 63%, Normal Study  . Hx of echocardiogram    a. Echocardiogram (01/22/14):  Mod LVH, EF 55-60%, no RWMA, Gr 1 DD, mild to mod MAC  . Hypertension   . Osteonecrosis [facility])     Patient Active Problem List   Diagnosis Date Noted  . Dyspnea 01/24/2014  . Pain in the chest   . Chest pain 01/21/2014  . Pyelonephritis 01/21/2014  . Sepsis ([place]) 01/21/2014  . Hypokalemia 01/21/2014  . Elevated troponin 01/21/2014  . Morbid obesity ([place]) 09/27/2013  . S/P left TKA 09/26/2013  . Fracture  of right shoulder 02/07/2011  . DM type 2 (diabetes mellitus, type 2) ([place]) 02/06/2011  . Bronchitis 12/17/2010  . LOW BACK PAIN, ACUTE 12/30/2009  . RENAL COLIC 85/27/7824  . EXOGENOUS OBESITY 07/21/2007  . ANXIETY DEPRESSION 07/21/2007  . COPD, MILD 07/21/2007  . Essential hypertension 12/01/2006    Past Surgical History:  Procedure Laterality Date  . cataract surgery     . FINGER SURGERY     related to tumors on finger removal   . TOTAL KNEE ARTHROPLASTY    . TOTAL KNEE ARTHROPLASTY Left 09/26/2013   Procedure: LEFT TOTAL KNEE ARTHROPLASTY;  Surgeon: Mauri Pole, MD;  Location: [facility] ORS;  Service: Orthopedics;  Laterality: Left;  . TUBAL LIGATION       OB History    Gravida  0   Para      Term      Preterm      AB      Living        SAB      TAB      Ectopic      Multiple      Live Births               Home Medications    Prior to Admission medications   Medication Sig Start Date End Date Taking? Authorizing Provider  acetaminophen (TYLENOL) 325  MG tablet Take 2 tablets (650 mg total) by mouth every 6 (six) hours as needed for mild pain (or Fever >/= 101). 09/27/13   Danae Orleans, PA-C  aspirin 81 MG chewable tablet Chew 81 mg by mouth daily.    [provider]  carvedilol (COREG) 6.25 MG tablet Take 1 tablet (6.25 mg total) by mouth 2 (two) times daily with a meal. 02/21/14   Weaver, Nicki Reaper T, PA-C  cholecalciferol (VITAMIN D) 1000 UNITS tablet Take 1,000 Units by mouth daily.    [provider]  clorazepate (TRANXENE) 3.75 MG tablet Take 1.875-3.375 mg by mouth 3 (three) times daily. Takes 1 tablet in the morning, 0.5 tablet at lunch and 1 in the evening.    [provider]  CRANBERRY PO Take 1 tablet by mouth 2 (two) times daily.    [provider]  DULoxetine (CYMBALTA) 60 MG capsule Take 60 mg by mouth daily. 12/05/15   [provider]  guaiFENesin (MUCINEX) 600 MG 12 hr tablet Take 600 mg by mouth 2  (two) times daily as needed for cough.    [provider]  losartan (COZAAR) 100 MG tablet Take 100 mg by mouth every morning.    [provider]  metFORMIN (GLUCOPHAGE-XR) 750 MG 24 hr tablet Take 1,500 mg by mouth daily with breakfast.    [provider]  polyvinyl alcohol (LIQUIFILM TEARS) 1.4 % ophthalmic solution Place 1 drop into both eyes 3 (three) times daily as needed for dry eyes.    [provider]  pravastatin (PRAVACHOL) 10 MG tablet Take 10 mg by mouth daily. 11/28/15   [provider]  pseudoephedrine-acetaminophen (TYLENOL SINUS) 30-500 MG TABS Take 1 tablet by mouth every 4 (four) hours as needed (Sinus).    [provider]  vitamin C (ASCORBIC ACID) 500 MG tablet Take 500 mg by mouth 2 (two) times daily.    [provider]    Family History Family History  Problem Relation Age of Onset  . Emphysema Father   . Heart attack Paternal Aunt     Social History Social History   Tobacco Use  . Smoking status: Former Smoker    Types: Cigarettes    Quit date: 06/23/2010    Years since quitting: 8.2  . Smokeless tobacco: Never Used  . Tobacco comment: E-Cigarette  Substance Use Topics  . Alcohol use: No  . Drug use: No     Allergies   Ciprofloxacin, Codeine, and Prednisone   Review of Systems Review of Systems  Constitutional: Negative for chills and fever.  HENT: Negative for ear pain and sore throat.   Eyes: Negative for pain and visual disturbance.  Respiratory: Negative for cough and shortness of breath.   Cardiovascular: Negative for chest pain and palpitations.  Gastrointestinal: Positive for diarrhea, nausea and vomiting. Negative for abdominal pain.  Genitourinary: Negative for dysuria and hematuria.  Musculoskeletal: Negative for arthralgias and back pain.  Skin: Negative for color change and rash.  Neurological: Negative for seizures and syncope.  All other systems reviewed and are  negative.    Physical Exam Updated Vital Signs BP (!) 153/83   Pulse (!) 118   Temp 98.1 F (36.7 C) (Oral)   Resp 18   LMP 06/23/1995   SpO2 94%   Physical Exam Vitals signs and nursing note reviewed.  Constitutional:      General: She is not in acute distress.    Appearance: She is well-developed.  HENT:  Head: Normocephalic and atraumatic.  Eyes:     Conjunctiva/sclera: Conjunctivae normal.  Neck:     Musculoskeletal: Neck supple.  Cardiovascular:     Rate and Rhythm: Regular rhythm. Tachycardia present.     Heart sounds: No murmur.  Pulmonary:     Effort: Pulmonary effort is normal. No respiratory distress.     Breath sounds: Normal breath sounds.  Abdominal:     Palpations: Abdomen is soft.     Tenderness: There is no abdominal tenderness.  Skin:    General: Skin is warm and dry.  Neurological:     Mental Status: She is alert.      ED Treatments / Results  Labs (all labs ordered are listed, but only abnormal results are displayed) Labs Reviewed  LIPASE, BLOOD  COMPREHENSIVE METABOLIC PANEL  CBC  URINALYSIS, ROUTINE W REFLEX MICROSCOPIC    EKG None  Radiology No results found.  Procedures Procedures (including critical care time)  Medications Ordered in ED Medications - No data to display   Initial Impression / Assessment and Plan / ED Course  I have reviewed the triage vital signs and the nursing notes.  Pertinent labs & imaging results that were available during my care of the patient were reviewed by me and considered in my medical decision making (see chart for details).  Clinical Course as of Sep 05 2222  Mon Sep 05, 2018  2025 Discussed with hospitalist, will accept, requests I notify urology   [RD]  [place] [place] urology consult   [RD]  2037 Discussed with Jeffie Pollock   [RD]    Clinical Course User Index [RD] Lucrezia Starch, MD       73 year old lady who presents with nausea, vomiting.  Work-up concerning for leukocytosis,  acute kidney injury, CT scan with evidence for left ureteral stone with obstruction.  Started ceftriaxone, obtain blood cultures, consult urology admitted to hospitalist service.  Urology planning stent placement.   [facility] accepting.  Final Clinical Impressions(s) / ED Diagnoses   Final diagnoses:  Dehydration  Gastroenteritis  Acute kidney injury [facility])    ED Discharge Orders    None       Lucrezia Starch, MD 09/05/18 2225

## 2018-09-05 NOTE — Anesthesia Postprocedure Evaluation (Signed)
Anesthesia Post Note  Patient: Shannon Lynch  Procedure(s) Performed: CYSTOSCOPY WITH URETERAL STENT PLACEMENT (Left Ureter)     Patient location during evaluation: PACU Anesthesia Type: General Level of consciousness: awake and alert and oriented Pain management: pain level controlled Vital Signs Assessment: post-procedure vital signs reviewed and stable Respiratory status: spontaneous breathing, nonlabored ventilation and respiratory function stable Cardiovascular status: blood pressure returned to baseline and stable Postop Assessment: no apparent nausea or vomiting Anesthetic complications: no    Last Vitals:  Vitals:   09/05/18 2245 09/05/18 2300  BP: 127/64 (!) 145/72  Pulse: (!) 107 97  Resp: (!) 23 17  Temp: 36.6 C   SpO2: 100% 100%    Last Pain:  Vitals:   09/05/18 1405  TempSrc: Oral  PainSc:                  [name]

## 2018-09-05 NOTE — Consult Note (Signed)
Subjective: CC: Nausea and Vomiting.  Hx: Shannon Lynch is a 73 yo female who I was asked to see in consultation by Dr. Roslynn Amble for a 12m left renal pelvic stone with mild/moderate hydro and sepsis of urinary origin with AKI.   She has a 3 week history of nausea, vomiting and diarrhea with a history  prior UTI's.   She has had no flank pain or hematuria.  She has had a 2 year history of progressive incontinence with nocturia.    ROS:  Review of Systems  Constitutional: Negative for chills and fever.  Respiratory: Negative for shortness of breath.   Cardiovascular: Negative for chest pain.  Gastrointestinal: Positive for diarrhea, nausea and vomiting.  Genitourinary: Positive for urgency. Negative for flank pain, frequency and hematuria.  All other systems reviewed and are negative.   Allergies  Allergen Reactions  . Ciprofloxacin Hives  . Codeine Nausea And Vomiting  . Prednisone Other (See Comments)    Elevated glucose level to 700    Past Medical History:  Diagnosis Date  . Anxiety   . Arthritis   . Chronic UTI (urinary tract infection)   . COPD (chronic obstructive pulmonary disease) ([place]   . Depression   . Diabetes mellitus without complication ([place]   . Exogenous obesity   . Hx of cardiovascular stress test    a. Nuclear Stress Test (02/14/14):  No ischemia, EF 63%, Normal Study  . Hx of echocardiogram    a. Echocardiogram (01/22/14):  Mod LVH, EF 55-60%, no RWMA, Gr 1 DD, mild to mod MAC  . Hypertension   . Osteonecrosis ([facility]     Past Surgical History:  Procedure Laterality Date  . cataract surgery     . FINGER SURGERY     related to tumors on finger removal   . TOTAL KNEE ARTHROPLASTY    . TOTAL KNEE ARTHROPLASTY Left 09/26/2013   Procedure: LEFT TOTAL KNEE ARTHROPLASTY;  Surgeon: MMauri Pole MD;  Location: [facility];  Service: Orthopedics;  Laterality: Left;  . TUBAL LIGATION      Social History   Socioeconomic History  . Marital status: Married    Spouse  name: Not on file  . Number of children: Not on file  . Years of education: Not on file  . Highest education level: Not on file  Occupational History  . Not on file  Social Needs  . Financial resource strain: Not on file  . Food insecurity    Worry: Not on file    Inability: Not on file  . Transportation needs    Medical: Not on file    Non-medical: Not on file  Tobacco Use  . Smoking status: Former Smoker    Types: Cigarettes    Quit date: 06/23/2010    Years since quitting: 8.2  . Smokeless tobacco: Never Used  . Tobacco comment: E-Cigarette  Substance and Sexual Activity  . Alcohol use: No  . Drug use: No  . Sexual activity: Not Currently    Partners: Male    Birth control/protection: Post-menopausal    Comment: Husband very sick  Lifestyle  . Physical activity    Days per week: Not on file    Minutes per session: Not on file  . Stress: Not on file  Relationships  . Social [place] phone: Not on file    Gets together: Not on file    Attends religious service: Not on file    Active member of club  or organization: Not on file    Attends meetings of clubs or organizations: Not on file    Relationship status: Not on file  . Intimate partner violence    Fear of current or ex partner: Not on file    Emotionally abused: Not on file    Physically abused: Not on file    Forced sexual activity: Not on file  Other Topics Concern  . Not on file  Social History Narrative  . Not on file    Family History  Problem Relation Age of Onset  . Emphysema Father   . Heart attack Paternal Aunt     Anti-infectives: Anti-infectives (From admission, onward)   Start     Dose/Rate Route Frequency Ordered Stop   09/05/18 2000  cefTRIAXone (ROCEPHIN) 2 g in sodium chloride 0.9 % 100 mL IVPB     2 g 200 mL/hr over 30 Minutes Intravenous  Once 09/05/18 1954        Current Facility-Administered Medications  Medication Dose Route Frequency Provider Last Rate Last  Dose  . cefTRIAXone (ROCEPHIN) 2 g in sodium chloride 0.9 % 100 mL IVPB  2 g Intravenous Once Lucrezia Starch, MD      . ondansetron [facility]) injection 4 mg  4 mg Intravenous Once Lucrezia Starch, MD       Current Outpatient Medications  Medication Sig Dispense Refill  . acetaminophen (TYLENOL) 325 MG tablet Take 2 tablets (650 mg total) by mouth every 6 (six) hours as needed for mild pain (or Fever >/= 101).    Marland Kitchen aspirin EC 81 MG tablet Take 81 mg by mouth daily.    . carvedilol (COREG) 6.25 MG tablet Take 1 tablet (6.25 mg total) by mouth 2 (two) times daily with a meal. 60 tablet 0  . Cholecalciferol (VITAMIN D) 50 MCG (2000 UT) tablet Take 2,000 Units by mouth daily.    . clorazepate (TRANXENE) 3.75 MG tablet Take 3.75 mg by mouth 2 (two) times daily.     Marland Kitchen CRANBERRY PO Take 1 tablet by mouth 2 (two) times daily.    . DULoxetine (CYMBALTA) 60 MG capsule Take 60 mg by mouth daily.    . metFORMIN (GLUCOPHAGE) 500 MG tablet Take 1,000 mg by mouth daily.    . ondansetron (ZOFRAN) 8 MG tablet Take 8 mg by mouth every 8 (eight) hours as needed for nausea or vomiting.    . polyvinyl alcohol (LIQUIFILM TEARS) 1.4 % ophthalmic solution Place 1 drop into both eyes 3 (three) times daily as needed for dry eyes.    . pravastatin (PRAVACHOL) 10 MG tablet Take 10 mg by mouth daily.    Marland Kitchen telmisartan (MICARDIS) 80 MG tablet Take 80 mg by mouth daily.    . vitamin C (ASCORBIC ACID) 500 MG tablet Take 500 mg by mouth 2 (two) times daily.       Objective: Vital signs in last 24 hours: Temp:  [98.1 F (36.7 C)] 98.1 F (36.7 C) (08/10 1405) Pulse Rate:  [99-118] 109 (08/10 2030) Resp:  [14-25] 21 (08/10 2030) BP: (135-175)/(51-105) 175/105 (08/10 2030) SpO2:  [91 %-95 %] 93 % (08/10 2030)  Intake/Output from previous day: No intake/output data recorded. Intake/Output this shift: No intake/output data recorded.   Physical Exam Vitals signs reviewed.  Constitutional:      General: She is  not in acute distress.    Appearance: Normal appearance. She is obese. She is not ill-appearing.  HENT:     Head:  Normocephalic and atraumatic.  Neck:     Musculoskeletal: Normal range of motion and neck supple.  Cardiovascular:     Rate and Rhythm: Regular rhythm. Tachycardia present.  Pulmonary:     Effort: Pulmonary effort is normal. No respiratory distress.     Breath sounds: Normal breath sounds.  Abdominal:     Palpations: Abdomen is soft. There is no mass.     Tenderness: There is no abdominal tenderness. There is no guarding.  Musculoskeletal: Normal range of motion.        General: No swelling or tenderness.  Lymphadenopathy:     Cervical: No cervical adenopathy.  Skin:    General: Skin is warm and dry.  Neurological:     General: No focal deficit present.     Mental Status: She is alert and oriented to person, place, and time.  Psychiatric:        Mood and Affect: Mood normal.        Behavior: Behavior normal.     Lab Results:  Recent Labs    09/05/18 1546  WBC 19.7*  HGB 13.7  HCT 43.7  PLT 525*   BMET Recent Labs    09/05/18 1546  NA 136  K 4.4  CL 95*  CO2 26  GLUCOSE 172*  BUN 38*  CREATININE 1.81*  CALCIUM 11.7*   PT/INR No results for input(s): LABPROT, INR in the last 72 hours. ABG No results for input(s): PHART, HCO3 in the last 72 hours.  Invalid input(s): PCO2, PO2  Studies/Results: Ct Abdomen Pelvis Wo Contrast  Result Date: 09/05/2018 CLINICAL DATA:  Nausea, vomiting and diarrhea for 3 weeks. Abdominal cramping. EXAM: CT ABDOMEN AND PELVIS WITHOUT CONTRAST TECHNIQUE: Multidetector CT imaging of the abdomen and pelvis was performed following the standard protocol without IV contrast. COMPARISON:  None. FINDINGS: Lower chest: Elevation of the left hemidiaphragm with overlying vascular crowding and atelectasis. Right basilar scarring changes also noted. The heart is normal in size. No pericardial effusion. Advanced three-vessel coronary  artery calcifications are noted. The distal esophagus is grossly normal. Hepatobiliary: Advanced cirrhotic changes involving the liver but no focal hepatic lesion is identified without contrast. Periportal lymph nodes are noted and are typical with cirrhosis. The gallbladder appears normal. No common bile duct dilatation. Pancreas: No mass, inflammation or ductal dilatation. Spleen: Normal sized.  No focal lesions. Adrenals/Urinary Tract: The adrenal glands are unremarkable. There are several left renal calculi with a 12 x 12 mm calculus noted in the renal pelvis. There is mild to moderate hydronephrosis and this calculus could be intermittently obstructing near the UPJ. Mild perinephric stranding. No right-sided renal calculi small right renal cysts. No obstructing left or right ureteral calculi. No bladder calculi. No worrisome renal or bladder lesions without contrast. Stomach/Bowel: The stomach, duodenum, small bowel and colon are grossly normal without oral contrast. No acute inflammatory changes, mass lesions or obstructive findings. The terminal ileum and appendix are normal. Vascular/Lymphatic: Advanced atherosclerotic calcifications involving the aorta and branch vessel ostia, in particularly the left renal artery is heavily calcified at its origin. No focal aneurysm. Scattered retroperitoneal lymph nodes may be related to the patient's cirrhosis. Reproductive: The uterus and ovaries are unremarkable. Other: No pelvic mass or free pelvic fluid collections. No pelvic adenopathy. A few scattered pelvic lymph nodes are noted. Musculoskeletal: No significant bony findings. IMPRESSION: 1. Left-sided renal calculi with a 12 mm calculus in the renal pelvis which could be intermittently obstructing near the UPJ. There is mild to  moderate left hydronephrosis. 2. Advanced cirrhotic changes involving the liver without obvious hepatic lesions without contrast. Periportal and retroperitoneal lymph nodes are likely  related to the patient's cirrhosis. 3. No acute abdominal/pelvic findings, mass lesions or overt lymphadenopathy. 4. Advanced atherosclerotic calcifications involving the aorta and branch vessels, in particular, the left renal artery at its origin and coronary arteries. Electronically Signed   By: Marijo Sanes M.D.   On: 09/05/2018 17:34   Dg Chest Portable 1 View  Result Date: 09/05/2018 CLINICAL DATA:  Shortness of breath EXAM: PORTABLE CHEST 1 VIEW COMPARISON:  12/13/2015 FINDINGS: Cardiac shadow is stable. Aortic calcifications are again seen. No acute infiltrate or sizable effusion is seen. No bony abnormality is noted. IMPRESSION: No active disease. Electronically Signed   By: Inez Catalina M.D.   On: 09/05/2018 17:21     Assessment: 83m left renal pelvic stone with obstruction and smaller lower pole renal stones with sepsis.    I am going to take her to the OR for cystoscopy and left ureteral stent insertion.  I have reviewed the risks of bleeding, infection, ureteral injury, need for secondary procedures, thrombotic events and anesthetic complications.   She is obese and has only mild hydro which would make placement of a perc more difficult than stent insertion.  AKI possibly from obstruction, dehydration and sepsis.      CC: Dr. RMadalyn Rob    JIrine Seal8/10/2018 [phone]

## 2018-09-06 ENCOUNTER — Encounter (HOSPITAL_COMMUNITY): Payer: Self-pay | Admitting: Urology

## 2018-09-06 DIAGNOSIS — R112 Nausea with vomiting, unspecified: Secondary | ICD-10-CM

## 2018-09-06 LAB — GLUCOSE, CAPILLARY
Glucose-Capillary: 127 mg/dL — ABNORMAL HIGH (ref 70–99)
Glucose-Capillary: 128 mg/dL — ABNORMAL HIGH (ref 70–99)
Glucose-Capillary: 132 mg/dL — ABNORMAL HIGH (ref 70–99)
Glucose-Capillary: 142 mg/dL — ABNORMAL HIGH (ref 70–99)
Glucose-Capillary: 157 mg/dL — ABNORMAL HIGH (ref 70–99)
Glucose-Capillary: 194 mg/dL — ABNORMAL HIGH (ref 70–99)

## 2018-09-06 LAB — COMPREHENSIVE METABOLIC PANEL
ALT: 23 U/L (ref 0–44)
AST: 37 U/L (ref 15–41)
Albumin: 3.4 g/dL — ABNORMAL LOW (ref 3.5–5.0)
Alkaline Phosphatase: 117 U/L (ref 38–126)
Anion gap: 12 (ref 5–15)
BUN: 30 mg/dL — ABNORMAL HIGH (ref 8–23)
CO2: 26 mmol/L (ref 22–32)
Calcium: 10.4 mg/dL — ABNORMAL HIGH (ref 8.9–10.3)
Chloride: 98 mmol/L (ref 98–111)
Creatinine, Ser: 1.41 mg/dL — ABNORMAL HIGH (ref 0.44–1.00)
GFR calc Af Amer: 42 mL/min — ABNORMAL LOW (ref >60)
GFR calc non Af Amer: 37 mL/min — ABNORMAL LOW (ref >60)
Glucose, Bld: 137 mg/dL — ABNORMAL HIGH (ref 70–99)
Potassium: 4.6 mmol/L (ref 3.5–5.1)
Sodium: 136 mmol/L (ref 135–145)
Total Bilirubin: 0.7 mg/dL (ref 0.3–1.2)
Total Protein: 7.6 g/dL (ref 6.5–8.1)

## 2018-09-06 LAB — CREATININE, URINE, RANDOM: Creatinine, Urine: 110.2 mg/dL

## 2018-09-06 LAB — CBC
HCT: 39 % (ref 36.0–46.0)
Hemoglobin: 11.9 g/dL — ABNORMAL LOW (ref 12.0–15.0)
MCH: 29.7 pg (ref 26.0–34.0)
MCHC: 30.5 g/dL (ref 30.0–36.0)
MCV: 97.3 fL (ref 80.0–100.0)
Platelets: 393 10*3/uL (ref 150–400)
RBC: 4.01 MIL/uL (ref 3.87–5.11)
RDW: 12.4 % (ref 11.5–15.5)
WBC: 15.3 10*3/uL — ABNORMAL HIGH (ref 4.0–10.5)
nRBC: 0 % (ref 0.0–0.2)

## 2018-09-06 LAB — MRSA PCR SCREENING: MRSA by PCR: NEGATIVE

## 2018-09-06 LAB — MAGNESIUM: Magnesium: 1.4 mg/dL — ABNORMAL LOW (ref 1.7–2.4)

## 2018-09-06 LAB — HEMOGLOBIN A1C
Hgb A1c MFr Bld: 5.8 % — ABNORMAL HIGH (ref 4.8–5.6)
Mean Plasma Glucose: 119.76 mg/dL

## 2018-09-06 LAB — PHOSPHORUS: Phosphorus: 3.1 mg/dL (ref 2.5–4.6)

## 2018-09-06 LAB — PROTIME-INR
INR: 1.1 (ref 0.8–1.2)
Prothrombin Time: 13.9 seconds (ref 11.4–15.2)

## 2018-09-06 LAB — APTT: aPTT: 36 seconds (ref 24–36)

## 2018-09-06 LAB — PROCALCITONIN: Procalcitonin: 0.12 ng/mL

## 2018-09-06 LAB — TSH: TSH: 0.76 u[IU]/mL (ref 0.350–4.500)

## 2018-09-06 LAB — SODIUM, URINE, RANDOM: Sodium, Ur: 82 mmol/L

## 2018-09-06 LAB — LACTIC ACID, PLASMA: Lactic Acid, Venous: 1.6 mmol/L (ref 0.5–1.9)

## 2018-09-06 MED ORDER — METOPROLOL TARTRATE 5 MG/5ML IV SOLN
5.0000 mg | Freq: Once | INTRAVENOUS | Status: AC
  Administered 2018-09-06: 04:00:00 5 mg via INTRAVENOUS
  Filled 2018-09-06: qty 5

## 2018-09-06 MED ORDER — MAGNESIUM SULFATE 2 GM/50ML IV SOLN
2.0000 g | Freq: Once | INTRAVENOUS | Status: AC
  Administered 2018-09-06: 2 g via INTRAVENOUS
  Filled 2018-09-06: qty 50

## 2018-09-06 MED ORDER — HYDRALAZINE HCL 20 MG/ML IJ SOLN
5.0000 mg | INTRAMUSCULAR | Status: DC | PRN
  Administered 2018-09-06: 03:00:00 5 mg via INTRAVENOUS
  Filled 2018-09-06: qty 1

## 2018-09-06 MED ORDER — HYDRALAZINE HCL 20 MG/ML IJ SOLN: 10.0000 mg | INTRAMUSCULAR | Status: DC | PRN

## 2018-09-06 MED ORDER — CARVEDILOL 12.5 MG PO TABS
12.5000 mg | ORAL_TABLET | Freq: Two times a day (BID) | ORAL | Status: DC
  Administered 2018-09-06 (×2): 12.5 mg via ORAL
  Filled 2018-09-06 (×3): qty 1

## 2018-09-06 MED ORDER — HEPARIN SODIUM (PORCINE) 5000 UNIT/ML IJ SOLN
5000.0000 [IU] | Freq: Three times a day (TID) | INTRAMUSCULAR | Status: DC
  Administered 2018-09-06 – 2018-09-08 (×6): 5000 [IU] via SUBCUTANEOUS
  Filled 2018-09-06 (×6): qty 1

## 2018-09-06 NOTE — Evaluation (Signed)
Physical Therapy Evaluation Patient Details Name: Shannon Lynch MRN: [id number] DOB: [birth year] Today's Date: 09/06/2018   History of Present Illness  73 yo female admitted with L renal stone s/p double J stent placement 8/10. Hx of CHF, DM, COPD, recurrent UTI, L TKA 2015, obesity.  Clinical Impression  On eval, pt was Min guard assist for mobility. She walked ~20 feet with a RW. Pt c/o feeling weak. Will continue to follow and progress activity as tolerated. Recommend HHPT.    Follow Up Recommendations Home health PT;Supervision/Assistance - 24 hour    Equipment Recommendations  None recommended by PT    Recommendations for Other Services       Precautions / Restrictions Precautions Precautions: Fall Required Braces or Orthoses: Other Brace Other Brace: Pt reports she has been wearing a brace on her L knee due to a ?patellar fx. States the doctor was "going to remove it on her last visit". Pt didn't bring brace with her to hospital Restrictions Weight Bearing Restrictions: No      Mobility  Bed Mobility Overal bed mobility: Needs Assistance Bed Mobility: Supine to Sit     Supine to sit: Min guard     General bed mobility comments: for safety  Transfers Overall transfer level: Needs assistance Equipment used: Rolling walker (2 wheeled) Transfers: Sit to/from Stand Sit to Stand: Min guard         General transfer comment: Close guard for safety.  Ambulation/Gait Ambulation/Gait assistance: Min guard Gait Distance (Feet): 20 Feet Assistive device: Rolling walker (2 wheeled) Gait Pattern/deviations: Decreased stride length     General Gait Details: close guard for safety. cues for safety, proper use of walker. pt tolerated distance well.  Stairs            Wheelchair Mobility    Modified Rankin (Stroke Patients Only)       Balance Overall balance assessment: Needs assistance   Sitting balance-Leahy Scale: Good     Standing balance  support: Bilateral upper extremity supported Standing balance-Leahy Scale: Poor                               Pertinent Vitals/Pain Pain Assessment: Faces Faces Pain Scale: Hurts even more Pain Location: bottom Pain Descriptors / Indicators: Discomfort;Grimacing Pain Intervention(s): Limited activity within patient's tolerance    Home Living Family/patient expects to be discharged to:: Private residence Living Arrangements: (husband has dementia) Available Help at Discharge: Family;Friend(s);Neighbor   Home Access: Stairs to enter Entrance Stairs-Rails: None Entrance Stairs-Number of Steps: 2 Home Layout: Two level;Able to live on main level with bedroom/bathroom Home Equipment: Gilford Rile - 2 wheels;Bedside commode      Prior Function           Comments: uses RW for ambulation. Per pt she has been wearing a KI since falling and cracking knee cap (~4weeks ago)     Hand Dominance   Dominant Hand: Right    Extremity/Trunk Assessment   Upper Extremity Assessment Upper Extremity Assessment: Defer to OT evaluation    Lower Extremity Assessment Lower Extremity Assessment: Generalized weakness    Cervical / Trunk Assessment Cervical / Trunk Assessment: Normal  Communication   Communication: No difficulties  Cognition Arousal/Alertness: Awake/alert Behavior During Therapy: WFL for tasks assessed/performed Overall Cognitive Status: Within Functional Limits for tasks assessed  General Comments      Exercises     Assessment/Plan    PT Assessment Patient needs continued PT services  PT Problem List Decreased strength;Decreased mobility;Decreased activity tolerance;Decreased balance;Decreased knowledge of use of DME       PT Treatment Interventions DME instruction;Gait training;Therapeutic exercise;Therapeutic activities;Patient/family education;Balance training;Functional mobility training    PT  Goals (Current goals can be found in the Care Plan section)  Acute Rehab PT Goals Patient Stated Goal: to get stronger PT Goal Formulation: With patient Time For Goal Achievement: 09/20/18 Potential to Achieve Goals: Good    Frequency Min 3X/week   Barriers to discharge        Co-evaluation   Reason for Co-Treatment: For patient/therapist safety;To address functional/ADL transfers   OT goals addressed during session: ADL's and self-care       AM-PAC PT "6 Clicks" Mobility  Outcome Measure Help needed turning from your back to your side while in a flat bed without using bedrails?: A Little Help needed moving from lying on your back to sitting on the side of a flat bed without using bedrails?: A Little Help needed moving to and from a bed to a chair (including a wheelchair)?: A Little Help needed standing up from a chair using your arms (e.g., wheelchair or bedside chair)?: A Little Help needed to walk in hospital room?: A Little Help needed climbing 3-5 steps with a railing? : A Little 6 Click Score: 18    End of Session Equipment Utilized During Treatment: Gait belt Activity Tolerance: Patient tolerated treatment well Patient left: in chair;with call bell/phone within reach   PT Visit Diagnosis: Unsteadiness on feet (R26.81);Muscle weakness (generalized) (M62.81)    Time: 8325-4982 PT Time Calculation (min) (ACUTE ONLY): 30 min   Charges:   PT Evaluation $PT Eval Moderate Complexity: [place], PT Acute Rehabilitation Services Pager: [phone] Office: [phone]

## 2018-09-06 NOTE — Progress Notes (Addendum)
Occupational Therapy Evaluation Patient Details Name: Shannon Lynch MRN: [id number] DOB: 10-09-1945 Today's Date: 09/06/2018    History of Present Illness 73 yo female with history of HTN, DM-2, COPD not on O2, recurrent UTI, diastolic CHF and ex-smoker presenting with nausea, vomiting and occasional diarrhea for past week that has gotten worse over the last few days.  Admitted for sepsis due to pyelonephritis/pyelonephrosis complicated by left renal stone and hydronephrosis, and AKI. Underwent Cystoscopy with insertion of left double-J stent 8/10.    Clinical Impression   PTA, pt lived at home with her husband, who has dementia, and was his primary caregiver. Pt currently requires min A with ADL and mobility for ADL @ RW level. Feel pt will progress to DC home with initial 24/7 S. Pt states she has family was can assist at DC. Recommend DC home with [place] and [place]. Will follow acutely to facilitate safe DC home.     Follow Up Recommendations  Home health OT;Supervision/Assistance - 24 hour    Equipment Recommendations  Tub/shower bench    Recommendations for Other Services       Precautions / Restrictions Precautions Precautions: Fall Required Braces or Orthoses: Other Brace Other Brace: Pt reports she has been wearing a brace on her L knee due to a ?patellar fx. States the doctor was "going to remove it on her last visit"      Mobility Bed Mobility               General bed mobility comments: OOB in chair  Transfers Overall transfer level: Needs assistance Equipment used: Rolling walker (2 wheeled) Transfers: Sit to/from Stand Sit to Stand: Min assist              Balance Overall balance assessment: Needs assistance   Sitting balance-Leahy Scale: Good       Standing balance-Leahy Scale: Fair                             ADL either performed or assessed with clinical judgement   ADL Overall ADL's : Needs  assistance/impaired Eating/Feeding: Independent   Grooming: Set up;Sitting   Upper Body Bathing: Set up;Sitting   Lower Body Bathing: Minimal assistance;Sit to/from stand   Upper Body Dressing : Set up;Sitting   Lower Body Dressing: Minimal assistance;Sit to/from stand   Toilet Transfer: Minimal assistance;RW;Ambulation;BSC   Toileting- Clothing Manipulation and Hygiene: Moderate assistance;Sit to/from stand       Functional mobility during ADLs: Minimal assistance;Rolling walker;Cueing for safety       Vision         Perception     Praxis      Pertinent Vitals/Pain Pain Assessment: Faces Faces Pain Scale: Hurts even more Pain Location: bottom Pain Descriptors / Indicators: Discomfort;Grimacing Pain Intervention(s): Limited activity within patient's tolerance     Hand Dominance Right   Extremity/Trunk Assessment Upper Extremity Assessment Upper Extremity Assessment: Generalized weakness   Lower Extremity Assessment Lower Extremity Assessment: Defer to PT evaluation   Cervical / Trunk Assessment Cervical / Trunk Assessment: Normal   Communication Communication Communication: No difficulties   Cognition Arousal/Alertness: Awake/alert Behavior During Therapy: WFL for tasks assessed/performed Overall Cognitive Status: Within Functional Limits for tasks assessed                                     General Comments  Exercises     Shoulder Instructions      Home Living Family/patient expects to be discharged to:: Private residence Living Arrangements: (husband has dementia) Available Help at Discharge: Family;Friend(s);Neighbor   Home Access: Stairs to enter [employer] of Steps: 2 Entrance Stairs-Rails: None Home Layout: Two level;Able to live on main level with bedroom/bathroom     Bathroom Shower/Tub: Tub/shower unit;Curtain   Bathroom Toilet: Handicapped height     Home Equipment: Environmental consultant - 2 wheels;Bedside  commode          Prior Functioning/Environment          Comments: uses RW for ambulation. Per pt she has been wearing a KI since falling and cracking knee cap (~4weeks ago)        OT Problem List: Decreased strength;Decreased activity tolerance;Decreased cognition;Decreased safety awareness;Decreased knowledge of use of DME or AE;Decreased knowledge of precautions;Obesity;Pain      OT Treatment/Interventions: Self-care/ADL training;Therapeutic exercise;Energy conservation;DME and/or AE instruction;Therapeutic activities;Patient/family education    OT Goals(Current goals can be found in the care plan section) Acute Rehab OT Goals Patient Stated Goal: to get stronger OT Goal Formulation: With patient Time For Goal Achievement: 09/20/18 Potential to Achieve Goals: Good  OT Frequency: Min 3X/week   Barriers to D/C:            Co-evaluation PT/OT/SLP Co-Evaluation/Treatment: Yes Reason for Co-Treatment: For patient/therapist safety;To address functional/ADL transfers   OT goals addressed during session: ADL's and self-care      AM-PAC OT "6 Clicks" Daily Activity     Outcome Measure Help from another person eating meals?: None Help from another person taking care of personal grooming?: A Little Help from another person toileting, which includes using toliet, bedpan, or urinal?: A Little Help from another person bathing (including washing, rinsing, drying)?: A Little Help from another person to put on and taking off regular upper body clothing?: A Little Help from another person to put on and taking off regular lower body clothing?: A Little 6 Click Score: 19   End of Session Equipment Utilized During Treatment: Gait belt;Rolling walker Nurse Communication: Mobility status  Activity Tolerance: Patient tolerated treatment well Patient left: in chair;with call bell/phone within reach;with chair alarm set  OT Visit Diagnosis: Unsteadiness on feet (R26.81);Muscle weakness  (generalized) (M62.81);Pain Pain - part of body: (bottom)                Time: 2549-8264 OT Time Calculation (min): 32 min Charges:  OT General Charges $OT Visit: 1 Visit OT Evaluation $OT Eval Moderate Complexity: [place], OT/L   Acute OT Clinical Specialist Acute Rehabilitation Services Pager [phone] Office [phone]   [facility] 09/06/2018, 2:43 PM

## 2018-09-06 NOTE — Plan of Care (Signed)
  Problem: Education: Goal: Knowledge of General Education information will improve Description Including pain rating scale, medication(s)/side effects and non-pharmacologic comfort measures Outcome: Progressing   

## 2018-09-06 NOTE — Progress Notes (Signed)
1 Day Post-Op  Subjective: She is doing well s/p stent insertion but has some bladder irritation with the foley and some left flank pain.  ROS:  Review of Systems  Constitutional: Negative for chills and fever.  All other systems reviewed and are negative.   Anti-infectives: Anti-infectives (From admission, onward)   Start     Dose/Rate Route Frequency Ordered Stop   09/06/18 1800  cefTRIAXone (ROCEPHIN) 1 g in sodium chloride 0.9 % 100 mL IVPB     1 g 200 mL/hr over 30 Minutes Intravenous Every 24 hours 09/05/18 2108     09/05/18 2000  cefTRIAXone (ROCEPHIN) 2 g in sodium chloride 0.9 % 100 mL IVPB     2 g 200 mL/hr over 30 Minutes Intravenous  Once 09/05/18 1954 09/05/18 2344      Current Facility-Administered Medications  Medication Dose Route Frequency Provider Last Rate Last Dose  . 0.9 %  sodium chloride infusion   Intravenous Continuous Toy Baker, MD   Stopped at 09/06/18 0554  . acetaminophen (TYLENOL) tablet 650 mg  650 mg Oral Q6H PRN Toy Baker, MD       Or  . acetaminophen (TYLENOL) suppository 650 mg  650 mg Rectal Q6H PRN Doutova, Anastassia, MD      . carvedilol (COREG) tablet 12.5 mg  12.5 mg Oral BID WC Gonfa, Taye T, MD      . cefTRIAXone (ROCEPHIN) 1 g in sodium chloride 0.9 % 100 mL IVPB  1 g Intravenous Q24H Doutova, Anastassia, MD      . Chlorhexidine Gluconate Cloth 2 % PADS 6 each  6 each Topical Daily Doutova, Anastassia, MD      . DULoxetine (CYMBALTA) DR capsule 60 mg  60 mg Oral Daily Doutova, Anastassia, MD      . hydrALAZINE (APRESOLINE) injection 10 mg  10 mg Intravenous Q4H PRN Gonfa, Taye T, MD      . HYDROcodone-acetaminophen (NORCO/VICODIN) 5-325 MG per tablet 1-2 tablet  1-2 tablet Oral Q4H PRN Doutova, Anastassia, MD      . insulin aspart (novoLOG) injection 0-9 Units  0-9 Units Subcutaneous Q4H Toy Baker, MD   1 Units at 09/06/18 0436  . MEDLINE mouth rinse  15 mL Mouth Rinse BID Doutova, Anastassia, MD      .  ondansetron (ZOFRAN) injection 4 mg  4 mg Intravenous Once Doutova, Anastassia, MD      . ondansetron (ZOFRAN) tablet 4 mg  4 mg Oral Q6H PRN Toy Baker, MD       Or  . ondansetron (ZOFRAN) injection 4 mg  4 mg Intravenous Q6H PRN Doutova, Anastassia, MD      . pravastatin (PRAVACHOL) tablet 10 mg  10 mg Oral Daily Doutova, Anastassia, MD         Objective: Vital signs in last 24 hours: Temp:  [97.9 F (36.6 C)-98.4 F (36.9 C)] 98.4 F (36.9 C) (08/11 0423) Pulse Rate:  [93-118] 93 (08/11 0200) Resp:  [14-25] 23 (08/11 0200) BP: (127-186)/(51-105) 186/80 (08/11 0200) SpO2:  [91 %-100 %] 99 % (08/11 0200) Weight:  [85.3 kg] 85.3 kg (08/10 2341)  Intake/Output from previous day: 08/10 0701 - 08/11 0700 In: 988.4 [I.V.:893.8; IV Piggyback:94.6] Out: 350 [Urine:350] Intake/Output this shift: No intake/output data recorded.   Physical Exam Vitals signs reviewed.  Constitutional:      Appearance: Normal appearance.  Abdominal:     Palpations: Abdomen is soft.     Tenderness: There is no abdominal tenderness.  Genitourinary:  Comments: Foley draining clear urine.  Neurological:     Mental Status: She is alert.     Lab Results:  Recent Labs    09/05/18 1546 09/06/18 0210  WBC 19.7* 15.3*  HGB 13.7 11.9*  HCT 43.7 39.0  PLT 525* 393   BMET Recent Labs    09/05/18 1546 09/06/18 0210  NA 136 136  K 4.4 4.6  CL 95* 98  CO2 26 26  GLUCOSE 172* 137*  BUN 38* 30*  CREATININE 1.81* 1.41*  CALCIUM 11.7* 10.4*   PT/INR Recent Labs    09/05/18 2341  LABPROT 13.9  INR 1.1   ABG No results for input(s): PHART, HCO3 in the last 72 hours.  Invalid input(s): PCO2, PO2  Studies/Results: Ct Abdomen Pelvis Wo Contrast  Result Date: 09/05/2018 CLINICAL DATA:  Nausea, vomiting and diarrhea for 3 weeks. Abdominal cramping. EXAM: CT ABDOMEN AND PELVIS WITHOUT CONTRAST TECHNIQUE: Multidetector CT imaging of the abdomen and pelvis was performed following  the standard protocol without IV contrast. COMPARISON:  None. FINDINGS: Lower chest: Elevation of the left hemidiaphragm with overlying vascular crowding and atelectasis. Right basilar scarring changes also noted. The heart is normal in size. No pericardial effusion. Advanced three-vessel coronary artery calcifications are noted. The distal esophagus is grossly normal. Hepatobiliary: Advanced cirrhotic changes involving the liver but no focal hepatic lesion is identified without contrast. Periportal lymph nodes are noted and are typical with cirrhosis. The gallbladder appears normal. No common bile duct dilatation. Pancreas: No mass, inflammation or ductal dilatation. Spleen: Normal sized.  No focal lesions. Adrenals/Urinary Tract: The adrenal glands are unremarkable. There are several left renal calculi with a 12 x 12 mm calculus noted in the renal pelvis. There is mild to moderate hydronephrosis and this calculus could be intermittently obstructing near the UPJ. Mild perinephric stranding. No right-sided renal calculi small right renal cysts. No obstructing left or right ureteral calculi. No bladder calculi. No worrisome renal or bladder lesions without contrast. Stomach/Bowel: The stomach, duodenum, small bowel and colon are grossly normal without oral contrast. No acute inflammatory changes, mass lesions or obstructive findings. The terminal ileum and appendix are normal. Vascular/Lymphatic: Advanced atherosclerotic calcifications involving the aorta and branch vessel ostia, in particularly the left renal artery is heavily calcified at its origin. No focal aneurysm. Scattered retroperitoneal lymph nodes may be related to the patient's cirrhosis. Reproductive: The uterus and ovaries are unremarkable. Other: No pelvic mass or free pelvic fluid collections. No pelvic adenopathy. A few scattered pelvic lymph nodes are noted. Musculoskeletal: No significant bony findings. IMPRESSION: 1. Left-sided renal calculi with  a 12 mm calculus in the renal pelvis which could be intermittently obstructing near the UPJ. There is mild to moderate left hydronephrosis. 2. Advanced cirrhotic changes involving the liver without obvious hepatic lesions without contrast. Periportal and retroperitoneal lymph nodes are likely related to the patient's cirrhosis. 3. No acute abdominal/pelvic findings, mass lesions or overt lymphadenopathy. 4. Advanced atherosclerotic calcifications involving the aorta and branch vessels, in particular, the left renal artery at its origin and coronary arteries. Electronically Signed   By: Marijo Sanes M.D.   On: 09/05/2018 17:34   Dg Chest Portable 1 View  Result Date: 09/05/2018 CLINICAL DATA:  Shortness of breath EXAM: PORTABLE CHEST 1 VIEW COMPARISON:  12/13/2015 FINDINGS: Cardiac shadow is stable. Aortic calcifications are again seen. No acute infiltrate or sizable effusion is seen. No bony abnormality is noted. IMPRESSION: No active disease. Electronically Signed   By: Inez Catalina  M.D.   On: 09/05/2018 17:21   Dg C-arm 1-60 Min-no Report  Result Date: 09/05/2018 Fluoroscopy was utilized by the requesting physician.  No radiographic interpretation.   Labs reviewed.   Assessment and Plan: Left renal pelvic stone with pyonephrosis and sepsis.   She is improving s/p left ureteral stenting.   I will d/c the foley and begin a diet.    Chronic follicular cystitis.  She will need to be maintained on a suppressive antibiotic for 3 months after completion of the acute course.  AKI.  Improving post stenting.       LOS: 1 day    Irine Seal 09/06/2018 [id number]

## 2018-09-06 NOTE — Progress Notes (Signed)
PROGRESS NOTE  Shannon Lynch ZRA:[id number] DOB: Mar 24, 1945   PCP: Thressa Sheller, MD (Inactive)  Patient is from: home  DOA: 09/05/2018 LOS: 1  Brief Narrative / Interim history: 73 yo female with history of HTN, DM-2, COPD not on O2, recurrent UTI, diastolic CHF and ex-smoker presenting with nausea, vomiting and occasional diarrhea for past week that has gotten worse over the last few days.  Admitted for sepsis due to pyelonephritis/pyelonephrosis complicated by left renal stone and hydronephrosis, and AKI.  Started on IV antibiotics.  She underwent stent placement by urology, Dr.Wrenn on 09/05/2018.    Subjective: No major events overnight of this morning.  She reports improvement in his symptoms.  No further nausea, vomiting or abdominal pain.  She reports mild left-sided back pain.  She denies chest pain or dyspnea.  She says she is hungry and likes to eat.  She had no urinary symptoms.  Objective: Vitals:   09/06/18 0000 09/06/18 0200 09/06/18 0423 09/06/18 0800  BP:  (!) 186/80    Pulse: 95 93    Resp: (!) 21 (!) 23    Temp:   98.4 F (36.9 C) 99.1 F (37.3 C)  TempSrc:   Oral Oral  SpO2: 99% 99%    Weight:      Height:        Intake/Output Summary (Last 24 hours) at 09/06/2018 1031 Last data filed at 09/06/2018 0554 Gross per 24 hour  Intake 988.38 ml  Output 350 ml  Net 638.38 ml   Filed Weights   09/05/18 2341  Weight: 85.3 kg    Examination:  GENERAL: No acute distress.  Appears well.  HEENT: MMM.  Vision and hearing grossly intact.  NECK: Supple.  No apparent JVD.  RESP:  No IWOB. Good air movement bilaterally. CVS:  RRR. Heart sounds normal.  ABD/GI/GU: Bowel sounds present. Soft. Non tender.  MSK/EXT:  Moves extremities. No apparent deformity or edema.  SKIN: no apparent skin lesion or wound NEURO: Awake, alert and oriented appropriately.  No gross deficit.  PSYCH: Calm. Normal affect.    I have personally reviewed the following labs and  images:  Radiology Studies: Ct Abdomen Pelvis Wo Contrast  Result Date: 09/05/2018 CLINICAL DATA:  Nausea, vomiting and diarrhea for 3 weeks. Abdominal cramping. EXAM: CT ABDOMEN AND PELVIS WITHOUT CONTRAST TECHNIQUE: Multidetector CT imaging of the abdomen and pelvis was performed following the standard protocol without IV contrast. COMPARISON:  None. FINDINGS: Lower chest: Elevation of the left hemidiaphragm with overlying vascular crowding and atelectasis. Right basilar scarring changes also noted. The heart is normal in size. No pericardial effusion. Advanced three-vessel coronary artery calcifications are noted. The distal esophagus is grossly normal. Hepatobiliary: Advanced cirrhotic changes involving the liver but no focal hepatic lesion is identified without contrast. Periportal lymph nodes are noted and are typical with cirrhosis. The gallbladder appears normal. No common bile duct dilatation. Pancreas: No mass, inflammation or ductal dilatation. Spleen: Normal sized.  No focal lesions. Adrenals/Urinary Tract: The adrenal glands are unremarkable. There are several left renal calculi with a 12 x 12 mm calculus noted in the renal pelvis. There is mild to moderate hydronephrosis and this calculus could be intermittently obstructing near the UPJ. Mild perinephric stranding. No right-sided renal calculi small right renal cysts. No obstructing left or right ureteral calculi. No bladder calculi. No worrisome renal or bladder lesions without contrast. Stomach/Bowel: The stomach, duodenum, small bowel and colon are grossly normal without oral contrast. No acute inflammatory changes, mass  lesions or obstructive findings. The terminal ileum and appendix are normal. Vascular/Lymphatic: Advanced atherosclerotic calcifications involving the aorta and branch vessel ostia, in particularly the left renal artery is heavily calcified at its origin. No focal aneurysm. Scattered retroperitoneal lymph nodes may be related  to the patient's cirrhosis. Reproductive: The uterus and ovaries are unremarkable. Other: No pelvic mass or free pelvic fluid collections. No pelvic adenopathy. A few scattered pelvic lymph nodes are noted. Musculoskeletal: No significant bony findings. IMPRESSION: 1. Left-sided renal calculi with a 12 mm calculus in the renal pelvis which could be intermittently obstructing near the UPJ. There is mild to moderate left hydronephrosis. 2. Advanced cirrhotic changes involving the liver without obvious hepatic lesions without contrast. Periportal and retroperitoneal lymph nodes are likely related to the patient's cirrhosis. 3. No acute abdominal/pelvic findings, mass lesions or overt lymphadenopathy. 4. Advanced atherosclerotic calcifications involving the aorta and branch vessels, in particular, the left renal artery at its origin and coronary arteries. Electronically Signed   By: Marijo Sanes M.D.   On: 09/05/2018 17:34   Dg Chest Portable 1 View  Result Date: 09/05/2018 CLINICAL DATA:  Shortness of breath EXAM: PORTABLE CHEST 1 VIEW COMPARISON:  12/13/2015 FINDINGS: Cardiac shadow is stable. Aortic calcifications are again seen. No acute infiltrate or sizable effusion is seen. No bony abnormality is noted. IMPRESSION: No active disease. Electronically Signed   By: Inez Catalina M.D.   On: 09/05/2018 17:21   Dg C-arm 1-60 Min-no Report  Result Date: 09/05/2018 Fluoroscopy was utilized by the requesting physician.  No radiographic interpretation.    Microbiology: Recent Results (from the past 240 hour(s))  SARS Coronavirus 2 [facility] order, Performed in [facility] hospital lab) Nasopharyngeal Nasopharyngeal Swab     Status: None   Collection Time: 09/05/18  4:36 PM   Specimen: Nasopharyngeal Swab  Result Value Ref Range Status   SARS Coronavirus 2 NEGATIVE NEGATIVE Final    Comment: (NOTE) If result is NEGATIVE SARS-CoV-2 target nucleic acids are NOT DETECTED. The SARS-CoV-2 RNA is generally  detectable in upper and lower  respiratory specimens during the acute phase of infection. The lowest  concentration of SARS-CoV-2 viral copies this assay can detect is 250  copies / mL. A negative result does not preclude SARS-CoV-2 infection  and should not be used as the sole basis for treatment or other  patient management decisions.  A negative result may occur with  improper specimen collection / handling, submission of specimen other  than nasopharyngeal swab, presence of viral mutation(s) within the  areas targeted by this assay, and inadequate number of viral copies  (<250 copies / mL). A negative result must be combined with clinical  observations, patient history, and epidemiological information. If result is POSITIVE SARS-CoV-2 target nucleic acids are DETECTED. The SARS-CoV-2 RNA is generally detectable in upper and lower  respiratory specimens dur ing the acute phase of infection.  Positive  results are indicative of active infection with SARS-CoV-2.  Clinical  correlation with patient history and other diagnostic information is  necessary to determine patient infection status.  Positive results do  not rule out bacterial infection or co-infection with other viruses. If result is PRESUMPTIVE POSTIVE SARS-CoV-2 nucleic acids MAY BE PRESENT.   A presumptive positive result was obtained on the submitted specimen  and confirmed on repeat testing.  While 2019 novel coronavirus  (SARS-CoV-2) nucleic acids may be present in the submitted sample  additional confirmatory testing may be necessary for epidemiological  and / or  clinical management purposes  to differentiate between  SARS-CoV-2 and other Sarbecovirus currently known to infect humans.  If clinically indicated additional testing with an alternate test  methodology [phone]) is advised. The SARS-CoV-2 RNA is generally  detectable in upper and lower respiratory sp ecimens during the acute  phase of infection. The  expected result is Negative. Fact Sheet for Patients:  StrictlyIdeas.no Fact Sheet for Healthcare Providers: BankingDealers.co.za This test is not yet approved or cleared by the [place] FDA and has been authorized for detection and/or diagnosis of SARS-CoV-2 by FDA under an Emergency Use Authorization (EUA).  This EUA will remain in effect (meaning this test can be used) for the duration of the COVID-19 declaration under Section 564(b)(1) of the Act, 21 U.S.C. section 360bbb-3(b)(1), unless the authorization is terminated or revoked sooner. Performed at [facility], [place] [address] [place], [place] [address]   Blood culture (routine x 2)     Status: None (Preliminary result)   Collection Time: 09/05/18  7:55 PM   Specimen: BLOOD RIGHT FOREARM  Result Value Ref Range Status   Specimen Description   Final    BLOOD RIGHT FOREARM Performed at [facility], [place] [address] [place], [place] [address]    Special Requests   Final    BOTTLES DRAWN AEROBIC AND ANAEROBIC Blood Culture results may not be optimal due to an inadequate volume of blood received in culture bottles Performed at [place] [address], [place], [place] [address]    Culture PENDING  Incomplete   Report Status PENDING  Incomplete  Blood culture (routine x 2)     Status: None (Preliminary result)   Collection Time: 09/05/18  8:00 PM   Specimen: BLOOD LEFT HAND  Result Value Ref Range Status   Specimen Description   Final    BLOOD LEFT HAND Performed at [facility], [place] [address] [place], [place] [address]    Special Requests   Final    BOTTLES DRAWN AEROBIC AND ANAEROBIC Blood Culture adequate volume Performed at [place] [address] [place], [place] [address]    Culture PENDING  Incomplete   Report Status PENDING  Incomplete  Urine Culture     Status: None (Preliminary  result)   Collection Time: 09/05/18  9:18 PM   Specimen: Cystoscopy; Urine  Result Value Ref Range Status   Specimen Description   Final    CYSTOSCOPY URINE Performed at [facility], [place] [address] [place], [place] [address]    Special Requests   Final    NONE Performed at [facility], [place] [address], [place], [place] [address]    Culture PENDING  Incomplete   Report Status PENDING  Incomplete  MRSA PCR Screening     Status: None   Collection Time: 09/05/18 11:46 PM   Specimen: Nasal Mucosa; Nasopharyngeal  Result Value Ref Range Status   MRSA by PCR NEGATIVE NEGATIVE Final    Comment:        The GeneXpert MRSA Assay (FDA approved for NASAL specimens only), is one component of a comprehensive MRSA colonization surveillance program. It is not intended to diagnose MRSA infection nor to guide or monitor treatment for MRSA infections. Performed at [facility], [place] [address], [place], [place] [address]     Sepsis Labs: Invalid input(s): PROCALCITONIN, LACTICIDVEN  Urine analysis:    Component Value Date/Time   COLORURINE YELLOW 09/05/2018 1852  APPEARANCEUR CLOUDY (A) 09/05/2018 1852   LABSPEC 1.016 09/05/2018 1852   PHURINE 5.0 09/05/2018 1852   GLUCOSEU NEGATIVE 09/05/2018 1852   HGBUR NEGATIVE 09/05/2018 1852   HGBUR negative 02/06/2010 1112   BILIRUBINUR NEGATIVE 09/05/2018 1852   BILIRUBINUR n 06/25/2010   KETONESUR NEGATIVE 09/05/2018 1852   PROTEINUR NEGATIVE 09/05/2018 1852   UROBILINOGEN 1.0 01/21/2014 1827   NITRITE POSITIVE (A) 09/05/2018 1852   LEUKOCYTESUR LARGE (A) 09/05/2018 1852    Anemia Panel: No results for input(s): VITAMINB12, FOLATE, FERRITIN, TIBC, IRON, RETICCTPCT in the last 72 hours.  Thyroid Function Tests: Recent Labs    09/06/18 0210  TSH 0.760    Lipid Profile: No results for input(s): CHOL, HDL, LDLCALC, TRIG, CHOLHDL, LDLDIRECT in the last 72 hours.  CBG: Recent Labs  Lab  09/05/18 2248 09/06/18 0023 09/06/18 0403 09/06/18 0738  GLUCAP 135* 132* 127* 128*    HbA1C: Recent Labs    09/05/18 2353  HGBA1C 5.8*    BNP (last 3 results): No results for input(s): PROBNP in the last 8760 hours.  Cardiac Enzymes: No results for input(s): CKTOTAL, CKMB, CKMBINDEX, TROPONINI in the last 168 hours.  Coagulation Profile: Recent Labs  Lab 09/05/18 2341  INR 1.1    Liver Function Tests: Recent Labs  Lab 09/05/18 1546 09/06/18 0210  AST 49* 37  ALT 28 23  ALKPHOS 151* 117  BILITOT 0.9 0.7  PROT 9.3* 7.6  ALBUMIN 4.1 3.4*   Recent Labs  Lab 09/05/18 1546  LIPASE 46   No results for input(s): AMMONIA in the last 168 hours.  Basic Metabolic Panel: Recent Labs  Lab 09/05/18 1546 09/06/18 0210  NA 136 136  K 4.4 4.6  CL 95* 98  CO2 26 26  GLUCOSE 172* 137*  BUN 38* 30*  CREATININE 1.81* 1.41*  CALCIUM 11.7* 10.4*  MG  --  1.4*  PHOS  --  3.1   GFR: Estimated Creatinine Clearance: 35.5 mL/min (A) (by C-G formula based on SCr of 1.41 mg/dL (H)).  CBC: Recent Labs  Lab 09/05/18 1546 09/06/18 0210  WBC 19.7* 15.3*  HGB 13.7 11.9*  HCT 43.7 39.0  MCV 96.5 97.3  PLT 525* 393    Procedures:  09/03/2018-ureteral stent placement.  Microbiology summarized: SARS-CoV-2 negative.  Assessment & Plan: Sepsis due to pyelonephritis complicated by renal stone status post stent placement on 09/05/2018. -Sepsis physiology resolving. -Continue IV ceftriaxone pending cultures. -Follow cultures and the speciation -Appreciate urology input.  Chronic follicular cystitis: -Urology recommends prolonged course of antibiotic for 3 months after acute course.  Nausea/vomiting/occasional diarrhea: Likely related to #1.  Resolved. -PRN antiemetics.  Leukocytosis: Likely due to the above.  Improving. -Continue trending.  AKI: Likely prerenal insult from the above.  Improving. -Continue monitoring -Avoid nephrotoxic meds. -Continue  monitoring.  Hypercalcemia: Likely dehydration from nausea and vomiting.  Improved. -Continue monitoring.  Hypomagnesemia:: Likely due to GI loss -Replenish and recheck  Hypertension: BP elevated this morning. -Increase Coreg and PRN hydralazine. -Adjust meds as appropriate. -Hold home Micardis in the setting of AKI.  NIDDM-2: On metformin at home. -SSI and CBG monitoring. -Continue home statin  Chronic COPD: Stable. -PRN breathing treatments  Chronic diastolic CHF: Stable.  No cardiopulmonary symptoms.  Not on diuretics at home. -Continue monitoring for cardiopulmonary symptoms -Daily weight, intake output and renal function.  Liver cirrhosis: Noted on CT abdomen and pelvis.  No history of this in the past.  Not a drinker. -Acute hepatitis panel -May need outpatient  evaluation with ultrasound of liver with elastography.  Obesity: BMI 34.4. -Encourage lifestyle change to lose weight.   Depression: Stable. -Continue Cymbalta  DVT prophylaxis: Start subcu heparin Code Status: Full code Family Communication: Patient and/or RN. Available if any question.  Disposition Plan: Remains inpatient. Consultants: Neurology   Antimicrobials: Anti-infectives (From admission, onward)   Start     Dose/Rate Route Frequency Ordered Stop   09/06/18 1800  cefTRIAXone (ROCEPHIN) 1 g in sodium chloride 0.9 % 100 mL IVPB     1 g 200 mL/hr over 30 Minutes Intravenous Every 24 hours 09/05/18 2108     09/05/18 2000  cefTRIAXone (ROCEPHIN) 2 g in sodium chloride 0.9 % 100 mL IVPB     2 g 200 mL/hr over 30 Minutes Intravenous  Once 09/05/18 1954 09/05/18 2344      Sch Meds:  Scheduled Meds: . carvedilol  12.5 mg Oral BID WC  . Chlorhexidine Gluconate Cloth  6 each Topical Daily  . DULoxetine  60 mg Oral Daily  . insulin aspart  0-9 Units Subcutaneous Q4H  . mouth rinse  15 mL Mouth Rinse BID  . ondansetron (ZOFRAN) IV  4 mg Intravenous Once  . pravastatin  10 mg Oral Daily    Continuous Infusions: . cefTRIAXone (ROCEPHIN)  IV     PRN Meds:.acetaminophen **OR** acetaminophen, hydrALAZINE, HYDROcodone-acetaminophen, ondansetron **OR** ondansetron (ZOFRAN) IV  35 minutes with more than 50% spent in reviewing records, counseling patient and coordinating care.  Taye T. [place]  If 7PM-7AM, please contact night-coverage [email or web address] Password [facility] 09/06/2018, 10:31 AM

## 2018-09-07 ENCOUNTER — Other Ambulatory Visit: Payer: Self-pay | Admitting: Urology

## 2018-09-07 LAB — COMPREHENSIVE METABOLIC PANEL
ALT: 21 U/L (ref 0–44)
AST: 30 U/L (ref 15–41)
Albumin: 3.3 g/dL — ABNORMAL LOW (ref 3.5–5.0)
Alkaline Phosphatase: 114 U/L (ref 38–126)
Anion gap: 10 (ref 5–15)
BUN: 25 mg/dL — ABNORMAL HIGH (ref 8–23)
CO2: 27 mmol/L (ref 22–32)
Calcium: 10 mg/dL (ref 8.9–10.3)
Chloride: 99 mmol/L (ref 98–111)
Creatinine, Ser: 1.04 mg/dL — ABNORMAL HIGH (ref 0.44–1.00)
GFR calc Af Amer: >60 mL/min (ref >60)
GFR calc non Af Amer: 53 mL/min — ABNORMAL LOW (ref >60)
Glucose, Bld: 138 mg/dL — ABNORMAL HIGH (ref 70–99)
Potassium: 4.2 mmol/L (ref 3.5–5.1)
Sodium: 136 mmol/L (ref 135–145)
Total Bilirubin: 0.5 mg/dL (ref 0.3–1.2)
Total Protein: 7.5 g/dL (ref 6.5–8.1)

## 2018-09-07 LAB — CBC
HCT: 35.5 % — ABNORMAL LOW (ref 36.0–46.0)
Hemoglobin: 11.3 g/dL — ABNORMAL LOW (ref 12.0–15.0)
MCH: 31 pg (ref 26.0–34.0)
MCHC: 31.8 g/dL (ref 30.0–36.0)
MCV: 97.5 fL (ref 80.0–100.0)
Platelets: 331 10*3/uL (ref 150–400)
RBC: 3.64 MIL/uL — ABNORMAL LOW (ref 3.87–5.11)
RDW: 12.5 % (ref 11.5–15.5)
WBC: 12.5 10*3/uL — ABNORMAL HIGH (ref 4.0–10.5)
nRBC: 0 % (ref 0.0–0.2)

## 2018-09-07 LAB — MAGNESIUM: Magnesium: 1.9 mg/dL (ref 1.7–2.4)

## 2018-09-07 LAB — GLUCOSE, CAPILLARY
Glucose-Capillary: 105 mg/dL — ABNORMAL HIGH (ref 70–99)
Glucose-Capillary: 105 mg/dL — ABNORMAL HIGH (ref 70–99)
Glucose-Capillary: 122 mg/dL — ABNORMAL HIGH (ref 70–99)
Glucose-Capillary: 134 mg/dL — ABNORMAL HIGH (ref 70–99)
Glucose-Capillary: 140 mg/dL — ABNORMAL HIGH (ref 70–99)
Glucose-Capillary: 140 mg/dL — ABNORMAL HIGH (ref 70–99)

## 2018-09-07 LAB — PHOSPHORUS: Phosphorus: 2.7 mg/dL (ref 2.5–4.6)

## 2018-09-07 MED ORDER — CARVEDILOL 6.25 MG PO TABS: 6.2500 mg | ORAL_TABLET | Freq: Two times a day (BID) | ORAL | Status: DC

## 2018-09-07 MED ORDER — CARVEDILOL 6.25 MG PO TABS
6.2500 mg | ORAL_TABLET | Freq: Two times a day (BID) | ORAL | Status: DC
  Administered 2018-09-07 – 2018-09-08 (×3): 6.25 mg via ORAL
  Filled 2018-09-07 (×3): qty 1

## 2018-09-07 MED ORDER — SODIUM CHLORIDE 0.9 % IV SOLN
INTRAVENOUS | Status: DC
  Administered 2018-09-07: 16:00:00 via INTRAVENOUS

## 2018-09-07 NOTE — Progress Notes (Signed)
Occupational Therapy Treatment Patient Details Name: Shannon Lynch MRN: [id number] DOB: 1945-08-12 Today's Date: 09/07/2018    History of present illness 73 yo female admitted with L renal stone s/p double J stent placement 8/10. Hx of CHF, DM, COPD, recurrent UTI, L TKA 2015, obesity.   OT comments  Pt did not want to perform ADL today. Checked on her a few times and assisted her to bathroom prior to back to bed. She is the caregiver for her husband, who has dementia.  She feels that they will help each other out. Encouraged her to move often with staff as her back hurt and also to increase her endurance.    Follow Up Recommendations  Home health OT;Supervision/Assistance - 24 hour    Equipment Recommendations  Tub/shower bench(this is an out of pocket expense, if pt wants it)    Recommendations for Other Services      Precautions / Restrictions Precautions Precautions: Fall Other Brace: Pt reports she has been wearing a brace on her L knee due to a ?patellar fx. States the doctor was "going to remove it on her last visit". Pt didn't bring brace with her to hospital Restrictions Weight Bearing Restrictions: No       Mobility Bed Mobility           Sit to supine: Min assist(for legs with HOB raised)      Transfers   Equipment used: Rolling walker (2 wheeled)   Sit to Stand: Min guard         General transfer comment: Close guard for safety.    Balance                                           ADL either performed or assessed with clinical judgement   ADL       Grooming: Wash/dry hands;Supervision/safety;Standing                   Toilet Transfer: Min guard;Ambulation;Comfort height toilet;RW   Toileting- Water quality scientist and Hygiene: Min guard;Sit to/from stand         General ADL Comments: ambulated to bathroom to use commode prior to back to bed. Encouraged pt to get up hourly with staff  as her back hurts.   had placed towel roll in lumbar area on a previous attempt to see her.     Vision       Perception     Praxis      Cognition Arousal/Alertness: Awake/alert Behavior During Therapy: WFL for tasks assessed/performed Overall Cognitive Status: Within Functional Limits for tasks assessed                                          Exercises     Shoulder Instructions       General Comments      Pertinent Vitals/ Pain       Pain Assessment: Faces Faces Pain Scale: Hurts even more Pain Location: back Pain Descriptors / Indicators: Sore Pain Intervention(s): Limited activity within patient's tolerance;Monitored during session;Repositioned  Home Living  Prior Functioning/Environment              Frequency  Min 2X/week        Progress Toward Goals  OT Goals(current goals can now be found in the care plan section)  Progress towards OT goals: Progressing toward goals     Plan      Co-evaluation                 AM-PAC OT "6 Clicks" Daily Activity     Outcome Measure   Help from another person eating meals?: None Help from another person taking care of personal grooming?: A Little Help from another person toileting, which includes using toliet, bedpan, or urinal?: A Little Help from another person bathing (including washing, rinsing, drying)?: A Little Help from another person to put on and taking off regular upper body clothing?: A Little Help from another person to put on and taking off regular lower body clothing?: A Little 6 Click Score: 19    End of Session    OT Visit Diagnosis: Unsteadiness on feet (R26.81);Muscle weakness (generalized) (M62.81);Pain   Activity Tolerance Patient limited by fatigue;Patient limited by pain   Patient Left in bed;with call bell/phone within reach;with bed alarm set   Nurse Communication          Time: 6979-4801 OT Time Calculation  (min): 15 min  Charges: OT General Charges $OT Visit: 1 Visit OT Treatments $Self Care/Home Management : 8-22 mins  Lesle Chris, OTR/L Acute Rehabilitation Services [phone] [place] pager [phone] office 09/07/2018   [place] 09/07/2018, 2:52 PM

## 2018-09-07 NOTE — TOC Progression Note (Signed)
Transition of Care [facility]) - Progression Note    Patient Details  Name: Shannon Lynch MRN: [id number] Date of Birth: 12-Apr-1945  Transition of [facility]) CM/SW Contact  Purcell Mouton, RN Phone Number: 09/07/2018, 4:31 PM  Clinical Narrative:    [name] will follow for HHRN/PT/NA.Pt is aware.         Expected Discharge Plan and Services                                                 Social Determinants of Health (SDOH) Interventions    Readmission Risk Interventions No flowsheet data found.

## 2018-09-07 NOTE — TOC Initial Note (Signed)
Transition of Care [facility]) - Initial/Assessment Note    Patient Details  Name: Shannon Lynch MRN: [id number] Date of Birth: 1945-12-28  [facility]) CM/SW Contact:    Purcell Mouton, RN Phone Number: 09/07/2018, 10:56 AM  Clinical Narrative:                 Spoke with pt concerning Home Health. Pt had no preference, will check to agencies. Pt states her husband has Dementia and she will need help. Encouraged pt to contact husband's insurance for CM.         Patient Goals and CMS Choice        Expected Discharge Plan and Services                                                Prior Living Arrangements/Services                       Activities of Daily Living Home Assistive Devices/Equipment: Raised toilet seat with rails, Walker (specify type) ADL Screening (condition at time of admission) Patient's cognitive ability adequate to safely complete daily activities?: Yes Is the patient deaf or have difficulty hearing?: No Does the patient have difficulty seeing, even when wearing glasses/contacts?: No Does the patient have difficulty concentrating, remembering, or making decisions?: No Patient able to express need for assistance with ADLs?: Yes Does the patient have difficulty dressing or bathing?: No Independently performs ADLs?: Yes (appropriate for developmental age) Does the patient have difficulty walking or climbing stairs?: Yes Weakness of Legs: None Weakness of Arms/Hands: None  Permission Sought/Granted                  Emotional Assessment              Admission diagnosis:  Dehydration [E86.0] Gastroenteritis [K52.9] Nephrolithiasis [N20.0] Acute kidney injury ([place]) [N17.9] Urinary tract infection with hematuria, site unspecified [N39.0, R31.9] Leukocytosis, unspecified type [D72.829] Patient Active Problem List   Diagnosis Date Noted  . Dehydration 09/05/2018  . AKI (acute kidney injury) ([place])  09/05/2018  . Ureteral stone 09/05/2018  . Diarrhea 09/05/2018  . Dyspnea 01/24/2014  . Pain in the chest   . Chest pain 01/21/2014  . Pyelonephritis 01/21/2014  . Sepsis ([place]) 01/21/2014  . Hypokalemia 01/21/2014  . Elevated troponin 01/21/2014  . Morbid obesity ([place]) 09/27/2013  . S/P left TKA 09/26/2013  . Fracture of right shoulder 02/07/2011  . DM type 2 (diabetes mellitus, type 2) ([place]) 02/06/2011  . Bronchitis 12/17/2010  . LOW BACK PAIN, ACUTE 12/30/2009  . RENAL COLIC 26/71/2458  . EXOGENOUS OBESITY 07/21/2007  . ANXIETY DEPRESSION 07/21/2007  . COPD, MILD 07/21/2007  . Essential hypertension 12/01/2006   PCP:  Thressa Sheller, MD (Inactive) Pharmacy:   [facility], [address] [place] [address] Phone: [phone] Fax: [phone]     Social Determinants of Health (SDOH) Interventions    Readmission Risk Interventions No flowsheet data found.

## 2018-09-07 NOTE — Progress Notes (Signed)
Patient currently has new order for IV fluids. RN attempted IV twice but unsuccessful. Patient is on list for a new IV start by IV team.

## 2018-09-07 NOTE — Progress Notes (Signed)
[facility]                                                                              Patient Demographics  Shannon Lynch, is a 73 y.o. female, DOB - 1945-02-02, XIP:[id number]  Admit date - 09/05/2018   Admitting Physician Toy Baker, MD  Outpatient Primary MD for the patient is Thressa Sheller, MD (Inactive)  Outpatient specialists:   LOS - 2  days   Medical records reviewed and are as summarized below:    Chief Complaint  Patient presents with  . Emesis  . Diarrhea       Brief summary  73 yo female with history of HTN, DM-2, COPD not on O2, recurrent UTI, diastolic CHF and ex-smoker presenting with nausea, vomiting and occasional diarrhea for past week that has gotten worse over the last few days.  Admitted for sepsis due to pyelonephritis/pyelonephrosis complicated by left renal stone and hydronephrosis, and AKI.  Patient was started on IV antibiotics.  She underwent stent placement by urology, Dr.Wrenn on 09/05/2018.    Assessment & Plan    Principal Problem:   Sepsis ([place]) secondary to pyonephrosis, left renal pelvic renal stone with obstruction, follicular cystitis, UTI -Patient met sepsis criteria at the time of admission with tachycardia, leukocytosis, acute kidney injury, source likely due to pyelonephrosis/pyelonephritis  - urology was consulted, status post cystoscopy, insertion of left double-J stent  -Currently on IV Rocephin, sepsis physiology resolving -Creatinine function improving, leukocytosis improving -Currently on IV Rocephin, follow urine culture and sensitivities  Active Problems:  Acute lower UTI -Urine culture showed more than 100,000 colonies of gram-negative rods, Enterococcus faecalis -Follow sensitivities, continue Rocephin for now -Blood cultures negative so far  Chronic follicular cystitis -Urology recommended prolonged course of antibiotics for 3 months after the acute course  Nausea vomiting and  diarrhea -Possibly due to #1 and gastroenteritis -Currently resolved, DC enteric precautions  Acute kidney injury -Likely due to #1, prerenal insult, UTI, obstructive uropathy -Creatinine 1.8 at the time of admission, status post renal stent -Creatinine now improving 1.0, continue to avoid nephrotoxic medications  Hypercalcemia -Likely due to dehydration, currently improving  Hypomagnesemia Likely due to GI loss, was replaced on 8/11, currently 1.9  Essential hypertension BP currently stable, continue Coreg, hydralazine -Home Micardis has been held due to AKI  Diabetes mellitus type 2, NIDDM, uncontrolled with hyperglycemia -Continue to hold metformin -Continue sliding scale insulin -Hemoglobin A1c 5.8  Chronic COPD -Currently stable, no wheezing  Chronic diastolic CHF -Euvolemic, stable, not on diuretics at home -Continue to monitor I's and O's  Liver cirrhosis -Incidentally noted on CT abdomen pelvis, does not drink alcohol -LFTs normal -Acute hepatitis panel pending  Liver cirrhosis: Noted on CT abdomen and pelvis.  No history of this in the past.  Not a drinker. -Acute hepatitis panel pending -May need outpatient evaluation with ultrasound of liver with elastography.  Obesity BMI 34.4, recommended lifestyle changes and weight control  Depression Continue Cymbalta  Code Status: Full code DVT Prophylaxis: Subcu heparin Family Communication: Discussed in detail with the patient, all imaging results, lab results explained to the patient    Disposition  Plan: Discussed in detail with the patient, she feels unsafe going home today, feels dizzy, weak.  Follow urine culture and sensitivities, hopefully DC home tomorrow  Time Spent in minutes    35 minutes  Procedures:  Cystoscopy with insertion of left double-J stent  Consultants:   Urology  Antimicrobials:   Anti-infectives (From admission, onward)   Start     Dose/Rate Route Frequency Ordered Stop    09/06/18 1800  cefTRIAXone (ROCEPHIN) 1 g in sodium chloride 0.9 % 100 mL IVPB     1 g 200 mL/hr over 30 Minutes Intravenous Every 24 hours 09/05/18 2108     09/05/18 2000  cefTRIAXone (ROCEPHIN) 2 g in sodium chloride 0.9 % 100 mL IVPB     2 g 200 mL/hr over 30 Minutes Intravenous  Once 09/05/18 1954 09/05/18 2344         Medications  Scheduled Meds: . carvedilol  6.25 mg Oral BID WC  . Chlorhexidine Gluconate Cloth  6 each Topical Daily  . DULoxetine  60 mg Oral Daily  . heparin injection (subcutaneous)  5,000 Units Subcutaneous Q8H  . insulin aspart  0-9 Units Subcutaneous Q4H  . mouth rinse  15 mL Mouth Rinse BID  . ondansetron (ZOFRAN) IV  4 mg Intravenous Once  . pravastatin  10 mg Oral Daily   Continuous Infusions: . sodium chloride    . cefTRIAXone (ROCEPHIN)  IV Stopped (09/06/18 1730)   PRN Meds:.acetaminophen **OR** acetaminophen, hydrALAZINE, HYDROcodone-acetaminophen, ondansetron **OR** ondansetron (ZOFRAN) IV      Subjective:   Shannon Lynch was seen and examined today.  Feels dizzy, weak, does not feel safe to go home.  Left flank pain controlled with meds, 5/10, no nausea or vomiting.  No diarrhea.  No fevers this morning.    Objective:   Vitals:   09/07/18 0027 09/07/18 0041 09/07/18 0446 09/07/18 1332  BP: (!) 114/53  112/65 128/70  Pulse: 68  65 70  Resp: '18  18 19  ' Temp: 97.8 F (36.6 C)  97.6 F (36.4 C) 97.6 F (36.4 C)  TempSrc: Oral  Oral Oral  SpO2: (!) 89% 95% 96% 92%  Weight:   85.6 kg   Height:   '5\' 2"'  (1.575 m)     Intake/Output Summary (Last 24 hours) at 09/07/2018 1512 Last data filed at 09/07/2018 0200 Gross per 24 hour  Intake 100 ml  Output 400 ml  Net -300 ml     Wt Readings from Last 3 Encounters:  09/07/18 85.6 kg  02/21/14 97.5 kg  02/14/14 97.5 kg    Physical Exam  General: Alert and oriented x 3, NAD  Eyes:   HEENT:  Atraumatic, normocephalic  Cardiovascular: S1 S2 clear, no murmurs, RRR. No  pedal edema b/l  Respiratory: CTAB, no wheezing, rales or rhonchi  Gastrointestinal: Soft, nontender, nondistended, NBS  Ext: no pedal edema bilaterally  Neuro: no new deficits  Musculoskeletal: No cyanosis, clubbing  Skin: No rashes  Psych: Normal affect and demeanor, alert and oriented x3      Data Reviewed:  I have personally reviewed following labs and imaging studies  Micro Results Recent Results (from the past 240 hour(s))  SARS Coronavirus 2 [facility] order, Performed in [facility] hospital lab) Nasopharyngeal Nasopharyngeal Swab     Status: None   Collection Time: 09/05/18  4:36 PM   Specimen: Nasopharyngeal Swab  Result Value Ref Range Status   SARS Coronavirus 2 NEGATIVE NEGATIVE Final    Comment: (NOTE) If result  is NEGATIVE SARS-CoV-2 target nucleic acids are NOT DETECTED. The SARS-CoV-2 RNA is generally detectable in upper and lower  respiratory specimens during the acute phase of infection. The lowest  concentration of SARS-CoV-2 viral copies this assay can detect is 250  copies / mL. A negative result does not preclude SARS-CoV-2 infection  and should not be used as the sole basis for treatment or other  patient management decisions.  A negative result may occur with  improper specimen collection / handling, submission of specimen other  than nasopharyngeal swab, presence of viral mutation(s) within the  areas targeted by this assay, and inadequate number of viral copies  (<250 copies / mL). A negative result must be combined with clinical  observations, patient history, and epidemiological information. If result is POSITIVE SARS-CoV-2 target nucleic acids are DETECTED. The SARS-CoV-2 RNA is generally detectable in upper and lower  respiratory specimens dur ing the acute phase of infection.  Positive  results are indicative of active infection with SARS-CoV-2.  Clinical  correlation with patient history and other diagnostic information is  necessary to  determine patient infection status.  Positive results do  not rule out bacterial infection or co-infection with other viruses. If result is PRESUMPTIVE POSTIVE SARS-CoV-2 nucleic acids MAY BE PRESENT.   A presumptive positive result was obtained on the submitted specimen  and confirmed on repeat testing.  While 2019 novel coronavirus  (SARS-CoV-2) nucleic acids may be present in the submitted sample  additional confirmatory testing may be necessary for epidemiological  and / or clinical management purposes  to differentiate between  SARS-CoV-2 and other Sarbecovirus currently known to infect humans.  If clinically indicated additional testing with an alternate test  methodology [phone]) is advised. The SARS-CoV-2 RNA is generally  detectable in upper and lower respiratory sp ecimens during the acute  phase of infection. The expected result is Negative. Fact Sheet for Patients:  StrictlyIdeas.no Fact Sheet for Healthcare Providers: BankingDealers.co.za This test is not yet approved or cleared by the [place] FDA and has been authorized for detection and/or diagnosis of SARS-CoV-2 by FDA under an Emergency Use Authorization (EUA).  This EUA will remain in effect (meaning this test can be used) for the duration of the COVID-19 declaration under Section 564(b)(1) of the Act, 21 U.S.C. section 360bbb-3(b)(1), unless the authorization is terminated or revoked sooner. Performed at [facility], [place] [address], [place], [place] [address]   Blood culture (routine x 2)     Status: None (Preliminary result)   Collection Time: 09/05/18  7:55 PM   Specimen: BLOOD RIGHT FOREARM  Result Value Ref Range Status   Specimen Description   Final    BLOOD RIGHT FOREARM Performed at [facility], [place] [address] [place], [place] [address]    Special Requests   Final    BOTTLES DRAWN AEROBIC AND ANAEROBIC Blood Culture results  may not be optimal due to an inadequate volume of blood received in culture bottles Performed at [place] [address], [place], [place] [address]    Culture   Final    NO GROWTH 1 DAY Performed at [facility], [place] [address] [place], [place] [address]    Report Status PENDING  Incomplete  Blood culture (routine x 2)     Status: None (Preliminary result)   Collection Time: 09/05/18  8:00 PM   Specimen: BLOOD LEFT HAND  Result Value Ref Range Status   Specimen Description  Final    BLOOD LEFT HAND Performed at [facility], [place] [address], [place], [place] [address]    Special Requests   Final    BOTTLES DRAWN AEROBIC AND ANAEROBIC Blood Culture adequate volume Performed at [place] [address], [place], [place] [address]    Culture   Final    NO GROWTH 1 DAY Performed at [facility], [place] [address], [place], [place] [address]    Report Status PENDING  Incomplete  Urine Culture     Status: Abnormal (Preliminary result)   Collection Time: 09/05/18  9:18 PM   Specimen: Cystoscopy; Urine  Result Value Ref Range Status   Specimen Description   Final    CYSTOSCOPY URINE Performed at [facility], [place] [address] [place] [address], [place], [place] [address]    Special Requests   Final    NONE Performed at [facility], [place] [address], [place], [place] [address]    Culture (A)  Final    >=100,000 COLONIES/mL GRAM NEGATIVE RODS >=100,000 COLONIES/mL ENTEROCOCCUS FAECALIS SUSCEPTIBILITIES TO FOLLOW Performed at [facility], [place] [address], [place], [place] [address]    Report Status PENDING  Incomplete  MRSA PCR Screening     Status: None   Collection Time: 09/05/18 11:46 PM   Specimen: Nasal Mucosa; Nasopharyngeal  Result Value Ref Range Status   MRSA by PCR NEGATIVE NEGATIVE Final    Comment:        The GeneXpert MRSA Assay (FDA approved for NASAL specimens only), is one component of  a comprehensive MRSA colonization surveillance program. It is not intended to diagnose MRSA infection nor to guide or monitor treatment for MRSA infections. Performed at [facility], [place] [address] [place], [place] [address]     Radiology Reports Ct Abdomen Pelvis Wo Contrast  Result Date: 09/05/2018 CLINICAL DATA:  Nausea, vomiting and diarrhea for 3 weeks. Abdominal cramping. EXAM: CT ABDOMEN AND PELVIS WITHOUT CONTRAST TECHNIQUE: Multidetector CT imaging of the abdomen and pelvis was performed following the standard protocol without IV contrast. COMPARISON:  None. FINDINGS: Lower chest: Elevation of the left hemidiaphragm with overlying vascular crowding and atelectasis. Right basilar scarring changes also noted. The heart is normal in size. No pericardial effusion. Advanced three-vessel coronary artery calcifications are noted. The distal esophagus is grossly normal. Hepatobiliary: Advanced cirrhotic changes involving the liver but no focal hepatic lesion is identified without contrast. Periportal lymph nodes are noted and are typical with cirrhosis. The gallbladder appears normal. No common bile duct dilatation. Pancreas: No mass, inflammation or ductal dilatation. Spleen: Normal sized.  No focal lesions. Adrenals/Urinary Tract: The adrenal glands are unremarkable. There are several left renal calculi with a 12 x 12 mm calculus noted in the renal pelvis. There is mild to moderate hydronephrosis and this calculus could be intermittently obstructing near the UPJ. Mild perinephric stranding. No right-sided renal calculi small right renal cysts. No obstructing left or right ureteral calculi. No bladder calculi. No worrisome renal or bladder lesions without contrast. Stomach/Bowel: The stomach, duodenum, small bowel and colon are grossly normal without oral contrast. No acute inflammatory changes, mass lesions or obstructive findings. The terminal ileum and appendix are normal.  Vascular/Lymphatic: Advanced atherosclerotic calcifications involving the aorta and branch vessel ostia, in particularly the left renal artery is heavily calcified at its origin. No focal aneurysm. Scattered retroperitoneal lymph nodes may be related to the patient's cirrhosis. Reproductive: The uterus and ovaries are  unremarkable. Other: No pelvic mass or free pelvic fluid collections. No pelvic adenopathy. A few scattered pelvic lymph nodes are noted. Musculoskeletal: No significant bony findings. IMPRESSION: 1. Left-sided renal calculi with a 12 mm calculus in the renal pelvis which could be intermittently obstructing near the UPJ. There is mild to moderate left hydronephrosis. 2. Advanced cirrhotic changes involving the liver without obvious hepatic lesions without contrast. Periportal and retroperitoneal lymph nodes are likely related to the patient's cirrhosis. 3. No acute abdominal/pelvic findings, mass lesions or overt lymphadenopathy. 4. Advanced atherosclerotic calcifications involving the aorta and branch vessels, in particular, the left renal artery at its origin and coronary arteries. Electronically Signed   By: Marijo Sanes M.D.   On: 09/05/2018 17:34   Dg Chest Portable 1 View  Result Date: 09/05/2018 CLINICAL DATA:  Shortness of breath EXAM: PORTABLE CHEST 1 VIEW COMPARISON:  12/13/2015 FINDINGS: Cardiac shadow is stable. Aortic calcifications are again seen. No acute infiltrate or sizable effusion is seen. No bony abnormality is noted. IMPRESSION: No active disease. Electronically Signed   By: Inez Catalina M.D.   On: 09/05/2018 17:21   Dg C-arm 1-60 Min-no Report  Result Date: 09/05/2018 Fluoroscopy was utilized by the requesting physician.  No radiographic interpretation.    Lab Data:  CBC: Recent Labs  Lab 09/05/18 1546 09/06/18 0210 09/07/18 0416  WBC 19.7* 15.3* 12.5*  HGB 13.7 11.9* 11.3*  HCT 43.7 39.0 35.5*  MCV 96.5 97.3 97.5  PLT 525* 393 270   Basic Metabolic  Panel: Recent Labs  Lab 09/05/18 1546 09/06/18 0210 09/07/18 0416  NA 136 136 136  K 4.4 4.6 4.2  CL 95* 98 99  CO2 '26 26 27  ' GLUCOSE 172* 137* 138*  BUN 38* 30* 25*  CREATININE 1.81* 1.41* 1.04*  CALCIUM 11.7* 10.4* 10.0  MG  --  1.4* 1.9  PHOS  --  3.1 2.7   GFR: Estimated Creatinine Clearance: 48.2 mL/min (A) (by C-G formula based on SCr of 1.04 mg/dL (H)). Liver Function Tests: Recent Labs  Lab 09/05/18 1546 09/06/18 0210 09/07/18 0416  AST 49* 37 30  ALT '28 23 21  ' ALKPHOS 151* 117 114  BILITOT 0.9 0.7 0.5  PROT 9.3* 7.6 7.5  ALBUMIN 4.1 3.4* 3.3*   Recent Labs  Lab 09/05/18 1546  LIPASE 46   No results for input(s): AMMONIA in the last 168 hours. Coagulation Profile: Recent Labs  Lab 09/05/18 2341  INR 1.1   Cardiac Enzymes: No results for input(s): CKTOTAL, CKMB, CKMBINDEX, TROPONINI in the last 168 hours. BNP (last 3 results) No results for input(s): PROBNP in the last 8760 hours. HbA1C: Recent Labs    09/05/18 2353  HGBA1C 5.8*   CBG: Recent Labs  Lab 09/06/18 1954 09/07/18 0024 09/07/18 0443 09/07/18 0828 09/07/18 1153  GLUCAP 157* 140* 122* 105* 140*   Lipid Profile: No results for input(s): CHOL, HDL, LDLCALC, TRIG, CHOLHDL, LDLDIRECT in the last 72 hours. Thyroid Function Tests: Recent Labs    09/06/18 0210  TSH 0.760   Anemia Panel: No results for input(s): VITAMINB12, FOLATE, FERRITIN, TIBC, IRON, RETICCTPCT in the last 72 hours. Urine analysis:    Component Value Date/Time   COLORURINE YELLOW 09/05/2018 1852   APPEARANCEUR CLOUDY (A) 09/05/2018 1852   LABSPEC 1.016 09/05/2018 1852   PHURINE 5.0 09/05/2018 1852   GLUCOSEU NEGATIVE 09/05/2018 1852   HGBUR NEGATIVE 09/05/2018 1852   HGBUR negative 02/06/2010 1112   BILIRUBINUR NEGATIVE 09/05/2018 1852   BILIRUBINUR n  06/25/2010   KETONESUR NEGATIVE 09/05/2018 [place] 09/05/2018 1852   UROBILINOGEN 1.0 01/21/2014 1827   NITRITE POSITIVE (A)  09/05/2018 1852   LEUKOCYTESUR LARGE (A) 09/05/2018 1852     Sherrick Araki M.D. [facility] 09/07/2018, 3:12 PM  Pager: [phone] Between 7am to 7pm - call Pager - [phone]  After 7pm go to [email or web address] - password [email or web address]  Call night coverage person covering after 7pm

## 2018-09-07 NOTE — Progress Notes (Signed)
OT Cancellation Note  Patient Details Name: Shannon Lynch MRN: [id number] DOB: March 31, 1945   Cancelled Treatment:    Reason Eval/Treat Not Completed: Other (comment).  Did not want to work with OT at 10:50 due to just getting comfortable in chair. Will try to check back later.  Mccoy Testa 09/07/2018, 12:01 PM  Lesle Chris, OTR/L Acute Rehabilitation Services [phone] [facility] pager [phone] office 09/07/2018

## 2018-09-08 LAB — CBC
HCT: 34.5 % — ABNORMAL LOW (ref 36.0–46.0)
Hemoglobin: 10.5 g/dL — ABNORMAL LOW (ref 12.0–15.0)
MCH: 30.3 pg (ref 26.0–34.0)
MCHC: 30.4 g/dL (ref 30.0–36.0)
MCV: 99.4 fL (ref 80.0–100.0)
Platelets: 315 10*3/uL (ref 150–400)
RBC: 3.47 MIL/uL — ABNORMAL LOW (ref 3.87–5.11)
RDW: 12.4 % (ref 11.5–15.5)
WBC: 10.4 10*3/uL (ref 4.0–10.5)
nRBC: 0 % (ref 0.0–0.2)

## 2018-09-08 LAB — BASIC METABOLIC PANEL WITH GFR
Anion gap: 12 (ref 5–15)
BUN: 18 mg/dL (ref 8–23)
CO2: 22 mmol/L (ref 22–32)
Calcium: 9.7 mg/dL (ref 8.9–10.3)
Chloride: 103 mmol/L (ref 98–111)
Creatinine, Ser: 0.97 mg/dL (ref 0.44–1.00)
GFR calc Af Amer: >60 mL/min (ref >60)
GFR calc non Af Amer: 58 mL/min — ABNORMAL LOW (ref >60)
Glucose, Bld: 91 mg/dL (ref 70–99)
Potassium: 4.2 mmol/L (ref 3.5–5.1)
Sodium: 137 mmol/L (ref 135–145)

## 2018-09-08 LAB — URINE CULTURE: Culture: >=100000 — AB

## 2018-09-08 LAB — HEPATITIS PANEL, ACUTE
HCV Ab: 0.2 s/co ratio (ref 0.0–0.9)
Hep A IgM: NEGATIVE
Hep B C IgM: NEGATIVE
Hepatitis B Surface Ag: NEGATIVE

## 2018-09-08 LAB — GLUCOSE, CAPILLARY
Glucose-Capillary: 101 mg/dL — ABNORMAL HIGH (ref 70–99)
Glucose-Capillary: 81 mg/dL (ref 70–99)
Glucose-Capillary: 96 mg/dL (ref 70–99)
Glucose-Capillary: 99 mg/dL (ref 70–99)

## 2018-09-08 MED ORDER — ONDANSETRON HCL 8 MG PO TABS: 8.0000 mg | ORAL_TABLET | Freq: Three times a day (TID) | ORAL | 0 refills | Status: DC | PRN

## 2018-09-08 MED ORDER — AMOXICILLIN-POT CLAVULANATE 875-125 MG PO TABS: 1.0000 | ORAL_TABLET | Freq: Two times a day (BID) | ORAL | 0 refills | Status: AC

## 2018-09-08 MED ORDER — TAMSULOSIN HCL 0.4 MG PO CAPS: 0.4000 mg | ORAL_CAPSULE | Freq: Every day | ORAL | Status: DC

## 2018-09-08 MED ORDER — TAMSULOSIN HCL 0.4 MG PO CAPS: 0.4000 mg | ORAL_CAPSULE | Freq: Every day | ORAL | 0 refills | Status: AC

## 2018-09-08 NOTE — Progress Notes (Addendum)
3 Days Post-Op  Subjective: She is doing well with her left stent on antibiotic therapy.  She has mild left abdominal pain and some increase in urge incontinence.   Her culture has grown klebsiella and enterococcus.  ROS:  Review of Systems  Genitourinary:       Urge incontinence.  All other systems reviewed and are negative.   Anti-infectives: Anti-infectives (From admission, onward)   Start     Dose/Rate Route Frequency Ordered Stop   09/06/18 1800  cefTRIAXone (ROCEPHIN) 1 g in sodium chloride 0.9 % 100 mL IVPB     1 g 200 mL/hr over 30 Minutes Intravenous Every 24 hours 09/05/18 2108     09/05/18 2000  cefTRIAXone (ROCEPHIN) 2 g in sodium chloride 0.9 % 100 mL IVPB     2 g 200 mL/hr over 30 Minutes Intravenous  Once 09/05/18 1954 09/05/18 2344      Current Facility-Administered Medications  Medication Dose Route Frequency Provider Last Rate Last Dose  . 0.9 %  sodium chloride infusion   Intravenous Continuous Rai, Ripudeep K, MD 75 mL/hr at 09/07/18 1900    . acetaminophen (TYLENOL) tablet 650 mg  650 mg Oral Q6H PRN Wendee Beavers T, MD   650 mg at 09/07/18 2252   Or  . acetaminophen (TYLENOL) suppository 650 mg  650 mg Rectal Q6H PRN Wendee Beavers T, MD      . carvedilol (COREG) tablet 6.25 mg  6.25 mg Oral BID WC Rai, Ripudeep K, MD   6.25 mg at 09/07/18 1816  . cefTRIAXone (ROCEPHIN) 1 g in sodium chloride 0.9 % 100 mL IVPB  1 g Intravenous Q24H Mercy Riding, MD   Stopped at 09/07/18 1846  . Chlorhexidine Gluconate Cloth 2 % PADS 6 each  6 each Topical Daily Mercy Riding, MD   6 each at 09/06/18 2137  . DULoxetine (CYMBALTA) DR capsule 60 mg  60 mg Oral Daily Gonfa, Taye T, MD   60 mg at 09/07/18 1024  . heparin injection 5,000 Units  5,000 Units Subcutaneous Q8H Wendee Beavers T, MD   5,000 Units at 09/08/18 0532  . hydrALAZINE (APRESOLINE) injection 10 mg  10 mg Intravenous Q4H PRN Gonfa, Taye T, MD      . HYDROcodone-acetaminophen (NORCO/VICODIN) 5-325 MG per tablet 1-2 tablet   1-2 tablet Oral Q4H PRN Gonfa, Taye T, MD      . insulin aspart (novoLOG) injection 0-9 Units  0-9 Units Subcutaneous Q4H Mercy Riding, MD   1 Units at 09/07/18 1819  . MEDLINE mouth rinse  15 mL Mouth Rinse BID Wendee Beavers T, MD   15 mL at 09/07/18 2137  . ondansetron (ZOFRAN) injection 4 mg  4 mg Intravenous Once Gonfa, Taye T, MD      . ondansetron (ZOFRAN) tablet 4 mg  4 mg Oral Q6H PRN Wendee Beavers T, MD       Or  . ondansetron (ZOFRAN) injection 4 mg  4 mg Intravenous Q6H PRN Cyndia Skeeters, Taye T, MD      . pravastatin (PRAVACHOL) tablet 10 mg  10 mg Oral Daily Wendee Beavers T, MD   10 mg at 09/07/18 1024     Objective: Vital signs in last 24 hours: Temp:  [97.6 F (36.4 C)-97.9 F (36.6 C)] 97.8 F (36.6 C) (08/13 0406) Pulse Rate:  [62-70] 67 (08/13 0406) Resp:  [18-19] 18 (08/13 0406) BP: (109-129)/(49-70) 129/68 (08/13 0406) SpO2:  [92 %-95 %] 95 % (08/13 0406)  Intake/Output from previous day: 08/12 0701 - 08/13 0700 In: 1040.2 [P.O.:720; I.V.:220.2; IV Piggyback:100] Out: -  Intake/Output this shift: No intake/output data recorded.   Physical Exam Vitals signs reviewed.  Constitutional:      Appearance: Normal appearance. She is obese.  Abdominal:     Palpations: Abdomen is soft.     Tenderness: There is abdominal tenderness (mild left CVAT).  Neurological:     Mental Status: She is alert.     Lab Results:  Recent Labs    09/07/18 0416 09/08/18 0346  WBC 12.5* 10.4  HGB 11.3* 10.5*  HCT 35.5* 34.5*  PLT 331 315   BMET Recent Labs    09/07/18 0416 09/08/18 0346  NA 136 137  K 4.2 4.2  CL 99 103  CO2 27 22  GLUCOSE 138* 91  BUN 25* 18  CREATININE 1.04* 0.97  CALCIUM 10.0 9.7   PT/INR Recent Labs    09/05/18 2341  LABPROT 13.9  INR 1.1   ABG No results for input(s): PHART, HCO3 in the last 72 hours.  Invalid input(s): PCO2, PO2  Studies/Results: No results found.   Assessment and Plan: Left renal stone with sepsis recovering well on  antibiotics following stenting.  She has klebsiella and enterococcus on culture and the rocephin isn't tested for the enterococcus but probably doesn't cover it.  She will need to be on suppressive antibiotics following completion of her therapeutic course.    I think macrobid 100mg  nightly would be a reasonable option.  With the 2 bacteria and no common sensitivities on the culture, it would be worthwhile to ask ID for suggestions for the discharge antibiotic.  She has office follow up scheduled with me on 8/19 and left lithotripsy on 09/19/18.   Urge incontinence.  This initially improved but has returned to baseline.   She will need this addressed after completion of stone treatment.       LOS: 3 days    Shannon Lynch 09/08/2018 [phone] ID: Cheree Ditto, female   DOB: Feb 01, 1945, 73 y.o.   MRN: [id number]

## 2018-09-08 NOTE — Progress Notes (Signed)
IV completed and stopped.

## 2018-09-08 NOTE — Progress Notes (Signed)
Physical Therapy Treatment Patient Details Name: Shannon Lynch MRN: [id number] DOB: 1946/01/23 Today's Date: 09/08/2018    History of Present Illness 73 yo female admitted with L renal stone s/p double J stent placement 8/10. Hx of CHF, DM, COPD, recurrent UTI, L TKA 2015, obesity.    PT Comments    Pt requesting to use bathroom urgently and then lower body bathing and dressing while in bathroom.  Pt min/guard for ambulating to bathroom with use of RW and only provided set up for lower body dressing.  Pt also stood at sink to wash hands and brush teeth with supervision.  Pt reports d/c home today.   Follow Up Recommendations  Home health PT;Supervision/Assistance - 24 hour     Equipment Recommendations  None recommended by PT    Recommendations for Other Services       Precautions / Restrictions Precautions Precautions: Fall Required Braces or Orthoses: Other Brace Other Brace: Pt reports she has been wearing a brace on her L knee due to a ?patellar fx. States the doctor was "going to remove it on her last visit". Pt didn't bring brace with her to hospital Restrictions Weight Bearing Restrictions: No    Mobility  Bed Mobility Overal bed mobility: Needs Assistance Bed Mobility: Supine to Sit;Sit to Supine     Supine to sit: Supervision Sit to supine: Supervision   General bed mobility comments: pt self assisted LEs  Transfers Overall transfer level: Needs assistance Equipment used: Rolling walker (2 wheeled) Transfers: Sit to/from Stand Sit to Stand: Min guard         General transfer comment: min/guard for safety  Ambulation/Gait Ambulation/Gait assistance: Min guard Gait Distance (Feet): 8 Feet(x2) Assistive device: Rolling walker (2 wheeled) Gait Pattern/deviations: Decreased stride length     General Gait Details: pt ambulated to/from bathroom, appears close to her baseline; reports chronic back pain   Stairs             Wheelchair  Mobility    Modified Rankin (Stroke Patients Only)       Balance Overall balance assessment: Needs assistance   Sitting balance-Leahy Scale: Good     Standing balance support: Bilateral upper extremity supported Standing balance-Leahy Scale: Poor                              Cognition Arousal/Alertness: Awake/alert Behavior During Therapy: WFL for tasks assessed/performed Overall Cognitive Status: Within Functional Limits for tasks assessed                                        Exercises      General Comments General comments (skin integrity, edema, etc.): pt very pleasant and talkative. Encouragement required to participate, however agreeable to LB ADL.      Pertinent Vitals/Pain Pain Assessment: 0-10 Pain Score: 7  Faces Pain Scale: Hurts even more Pain Location: back Pain Descriptors / Indicators: Sore Pain Intervention(s): Repositioned(pt called out for tylenol end of session)    Home Living                      Prior Function            PT Goals (current goals can now be found in the care plan section) Acute Rehab PT Goals Patient Stated Goal: to get stronger Progress towards PT  goals: Progressing toward goals    Frequency    Min 3X/week      PT Plan Current plan remains appropriate    Co-evaluation              AM-PAC PT "6 Clicks" Mobility   Outcome Measure  Help needed turning from your back to your side while in a flat bed without using bedrails?: A Little Help needed moving from lying on your back to sitting on the side of a flat bed without using bedrails?: A Little Help needed moving to and from a bed to a chair (including a wheelchair)?: A Little Help needed standing up from a chair using your arms (e.g., wheelchair or bedside chair)?: A Little Help needed to walk in hospital room?: A Little Help needed climbing 3-5 steps with a railing? : A Little 6 Click Score: 18    End of Session    Activity Tolerance: Patient tolerated treatment well Patient left: in bed;with call bell/phone within reach;with bed alarm set   PT Visit Diagnosis: Muscle weakness (generalized) (M62.81)     Time: 9242-6834 PT Time Calculation (min) (ACUTE ONLY): 42 min  Charges:  $Gait Training: 8-22 mins $Therapeutic Activity: 8-22 mins                     Carmelia Bake, PT, DPT Acute Rehabilitation Services Office: [phone] Pager: [phone]  Trena Platt 09/08/2018, 12:13 PM

## 2018-09-08 NOTE — Progress Notes (Signed)
Occupational Therapy Treatment Patient Details Name: Shannon Lynch MRN: [id number] DOB: [birth year] Today's Date: 09/08/2018    History of present illness 73 yo female admitted with L renal stone s/p double J stent placement 8/10. Hx of CHF, DM, COPD, recurrent UTI, L TKA 2015, obesity.   OT comments  Pt slowly progressing toward OT goals. Session limited due to pain, however, pt agreeable to dressing tasks while seated at EOB. Min guard bed mobility to sit at EOB and Min A entering bed due to pain and safety. Supervision for safety LB dressing due to patient performing figure 4 to don/doff sock. Pt will benefit from continued acute OT to address safety in ADLs and to improve strength for functional mobility. DC and freq remains the same. OT will continue to follow acutely.    Follow Up Recommendations  Home health OT;Supervision/Assistance - 24 hour    Equipment Recommendations  Tub/shower bench(this is an out of pocket expense, if pt wants it)    Recommendations for Other Services      Precautions / Restrictions Precautions Precautions: Fall Required Braces or Orthoses: Other Brace Other Brace: Pt reports she has been wearing a brace on her L knee due to a ?patellar fx. States the doctor was "going to remove it on her last visit". Pt didn't bring brace with her to hospital Restrictions Weight Bearing Restrictions: No       Mobility Bed Mobility Overal bed mobility: Needs Assistance Bed Mobility: Supine to Sit     Supine to sit: Min guard Sit to supine: Min assist(for legs with HOB raised)   General bed mobility comments: for safety and pain  Transfers Overall transfer level: Needs assistance Equipment used: Rolling walker (2 wheeled)             General transfer comment: session limited to EOB LB ADLs.    Balance Overall balance assessment: Needs assistance   Sitting balance-Leahy Scale: Good     Standing balance support: Bilateral upper extremity  supported Standing balance-Leahy Scale: Poor                             ADL either performed or assessed with clinical judgement   ADL Overall ADL's : Needs assistance/impaired                     Lower Body Dressing: Sit to/from stand;Min guard                 General ADL Comments: session limited to EOB level to perform LB ADLs. Pt reports having pain on back and side of stomach.      Vision       Perception     Praxis      Cognition Arousal/Alertness: Awake/alert Behavior During Therapy: WFL for tasks assessed/performed Overall Cognitive Status: Within Functional Limits for tasks assessed                                          Exercises     Shoulder Instructions       General Comments pt very pleasant and talkative. Encouragement required to participate, however agreeable to LB ADL.    Pertinent Vitals/ Pain       Pain Assessment: Faces Faces Pain Scale: Hurts even more Pain Location: back and side Pain Descriptors / Indicators: Sore  Pain Intervention(s): Limited activity within patient's tolerance;Monitored during session;Repositioned  Home Living                                          Prior Functioning/Environment              Frequency  Min 2X/week        Progress Toward Goals  OT Goals(current goals can now be found in the care plan section)  Progress towards OT goals: Progressing toward goals  Acute Rehab OT Goals Patient Stated Goal: to get stronger OT Goal Formulation: With patient Time For Goal Achievement: 09/20/18 Potential to Achieve Goals: Good ADL Goals Pt Will Perform Lower Body Bathing: with modified independence;with adaptive equipment;sit to/from stand Pt Will Perform Lower Body Dressing: with modified independence;sit to/from stand;with adaptive equipment Pt Will Transfer to Toilet: with modified independence;bedside commode;ambulating Additional ADL Goal  #1: Pt will independetnly verbalize 3 strategies for reducing risk of falls  Plan Discharge plan remains appropriate;Frequency remains appropriate    Co-evaluation                 AM-PAC OT "6 Clicks" Daily Activity     Outcome Measure   Help from another person eating meals?: None Help from another person taking care of personal grooming?: A Little Help from another person toileting, which includes using toliet, bedpan, or urinal?: A Little Help from another person bathing (including washing, rinsing, drying)?: A Little Help from another person to put on and taking off regular upper body clothing?: A Little Help from another person to put on and taking off regular lower body clothing?: A Little 6 Click Score: 19    End of Session    OT Visit Diagnosis: Unsteadiness on feet (R26.81);Muscle weakness (generalized) (M62.81);Pain Pain - part of body: (bottom)   Activity Tolerance Patient limited by fatigue;Patient limited by pain   Patient Left in bed;with call bell/phone within reach;with bed alarm set   Nurse Communication Mobility status        Time: 1052-1110 OT Time Calculation (min): 18 min  Charges: OT General Charges $OT Visit: 1 Visit OT Treatments $Self Care/Home Management : 8-22 mins  Minus Breeding, MSOT, OTR/L  Supplemental Rehabilitation Services  [phone]    Marius Ditch 09/08/2018, 11:40 AM

## 2018-09-08 NOTE — Discharge Instructions (Signed)
Ureteral Stent Implantation, Care After °This sheet gives you information about how to care for yourself after your procedure. Your health care provider may also give you more specific instructions. If you have problems or questions, contact your health care provider. °What can I expect after the procedure? °After the procedure, it is common to have: °· Nausea. °· Mild pain when you urinate. You may feel this pain in your lower back or lower abdomen. The pain should stop within a few minutes after you urinate. This may last for up to 1 week. °· A small amount of blood in your urine for several days. °Follow these instructions at home: °Medicines °· Take over-the-counter and prescription medicines only as told by your health care provider. °· If you were prescribed an antibiotic medicine, take it as told by your health care provider. Do not stop taking the antibiotic even if you start to feel better. °· Do not drive for 24 hours if you were given a sedative during your procedure. °· Ask your health care provider if the medicine prescribed to you requires you to avoid driving or using heavy machinery. °Activity °· Rest as told by your health care provider. °· Avoid sitting for a long time without moving. Get up to take short walks every 1-2 hours. This is important to improve blood flow and breathing. Ask for help if you feel weak or unsteady. °· Return to your normal activities as told by your health care provider. Ask your health care provider what activities are safe for you. °General instructions ° °· Watch for any blood in your urine. Call your health care provider if the amount of blood in your urine increases. °· If you have a catheter: °? Follow instructions from your health care provider about taking care of your catheter and collection bag. °? Do not take baths, swim, or use a hot tub until your health care provider approves. Ask your health care provider if you may take showers. You may only be allowed to  take sponge baths. °· Drink enough fluid to keep your urine pale yellow. °· Do not use any products that contain nicotine or tobacco, such as cigarettes, e-cigarettes, and chewing tobacco. These can delay healing after surgery. If you need help quitting, ask your health care provider. °· Keep all follow-up visits as told by your health care provider. This is important. °Contact a health care provider if: °· You have pain that gets worse or does not get better with medicine, especially pain when you urinate. °· You have difficulty urinating. °· You feel nauseous or you vomit repeatedly during a period of more than 2 days after the procedure. °Get help right away if: °· Your urine is dark red or has blood clots in it. °· You are leaking urine (have incontinence). °· The end of the stent comes out of your urethra. °· You cannot urinate. °· You have sudden, sharp, or severe pain in your abdomen or lower back. °· You have a fever. °· You have swelling or pain in your legs. °· You have difficulty breathing. °Summary °· After the procedure, it is common to have mild pain when you urinate that goes away within a few minutes after you urinate. This may last for up to 1 week. °· Watch for any blood in your urine. Call your health care provider if the amount of blood in your urine increases. °· Take over-the-counter and prescription medicines only as told by your health care provider. °· Drink   enough fluid to keep your urine pale yellow. This information is not intended to replace advice given to you by your health care provider. Make sure you discuss any questions you have with your health care provider. Document Released: 09/14/2012 Document Revised: 10/19/2017 Document Reviewed: 10/20/2017 Elsevier Patient Education  2020 [place] is a treatment that can sometimes help eliminate kidney stones and the pain that they cause. A form of lithotripsy, also known as extracorporeal shock wave  lithotripsy, is a nonsurgical procedure that crushes a kidney stone with shock waves. These shock waves pass through your body and focus on the kidney stone. They cause the kidney stone to break up while it is still in the urinary tract. This makes it easier for the smaller pieces of stone to pass in the urine. Tell a health care provider about:  Any allergies you have.  All medicines you are taking, including vitamins, herbs, eye drops, creams, and over-the-counter medicines.  Any blood disorders you have.  Any surgeries you have had.  Any medical conditions you have.  Whether you are pregnant or may be pregnant.  Any problems you or family members have had with anesthetic medicines. What are the risks? Generally, this is a safe procedure. However, problems may occur, including:  Infection.  Bleeding of the kidney.  Bruising of the kidney or skin.  Scarring of the kidney, which can lead to: ? Increased blood pressure. ? Poor kidney function. ? Return (recurrence) of kidney stones.  Damage to other structures or organs, such as the liver, colon, spleen, or pancreas.  Blockage (obstruction) of the the tube that carries urine from the kidney to the bladder (ureter).  Failure of the kidney stone to break into pieces (fragments). What happens before the procedure? Staying hydrated Follow instructions from your health care provider about hydration, which may include:  Up to 2 hours before the procedure - you may continue to drink clear liquids, such as water, clear fruit juice, black coffee, and plain tea. Eating and drinking restrictions Follow instructions from your health care provider about eating and drinking, which may include:  8 hours before the procedure - stop eating heavy meals or foods such as meat, fried foods, or fatty foods.  6 hours before the procedure - stop eating light meals or foods, such as toast or cereal.  6 hours before the procedure - stop drinking  milk or drinks that contain milk.  2 hours before the procedure - stop drinking clear liquids. General instructions  Plan to have someone take you home from the hospital or clinic.  Ask your health care provider about: ? Changing or stopping your regular medicines. This is especially important if you are taking diabetes medicines or blood thinners. ? Taking medicines such as aspirin and ibuprofen. These medicines and other NSAIDs can thin your blood. Do not take these medicines for 7 days before your procedure if your health care provider instructs you not to.  You may have tests, such as: ? Blood tests. ? Urine tests. ? Imaging tests, such as a CT scan. What happens during the procedure?  To lower your risk of infection: ? Your health care team will wash or sanitize their hands. ? Your skin will be washed with soap.  An IV tube will be inserted into one of your veins. This tube will give you fluids and medicines.  You will be given one or more of the following: ? A medicine to help you  relax (sedative). ? A medicine to make you fall asleep (general anesthetic).  A water-filled cushion may be placed behind your kidney or on your abdomen. In some cases you may be placed in a tub of lukewarm water.  Your body will be positioned in a way that makes it easy to target the kidney stone.  A flexible tube with holes in it (stent) may be placed in the ureter. This will help keep urine flowing from the kidney if the fragments of the stone have been blocking the ureter.  An X-ray or ultrasound exam will be done to locate your stone.  Shock waves will be aimed at the stone. If you are awake, you may feel a tapping sensation as the shock waves pass through your body. The procedure may vary among health care providers and hospitals. What happens after the procedure?  You may have an X-ray to see whether the procedure was able to break up the kidney stone and how much of the stone has passed.  If large stone fragments remain after treatment, you may need to have a second procedure at a later time.  Your blood pressure, heart rate, breathing rate, and blood oxygen level will be monitored until the medicines you were given have worn off.  You may be given antibiotics or pain medicine as needed.  If a stent was placed in your ureter during surgery, it may stay in place for a few weeks.  You may need strain your urine to collect pieces of the kidney stone for testing.  You will need to drink plenty of water.  Do not drive for 24 hours if you were given a sedative. Summary  Lithotripsy is a treatment that can sometimes help eliminate kidney stones and the pain that they cause.  A form of lithotripsy, also known as extracorporeal shock wave lithotripsy, is a nonsurgical procedure that crushes a kidney stone with shock waves.  Generally, this is a safe procedure. However, problems may occur, including damage to the kidney or other organs, infection, or obstruction of the tube that carries urine from the kidney to the bladder (ureter).  When you go home, you will need to drink plenty of water. You may be asked to strain your urine to collect pieces of the kidney stone for testing. This information is not intended to replace advice given to you by your health care provider. Make sure you discuss any questions you have with your health care provider. Document Released: 01/10/2000 Document Revised: 04/25/2018 Document Reviewed: 12/04/2015 Elsevier Patient Education  2020 [employer].

## 2018-09-08 NOTE — Progress Notes (Signed)
Patient's IV removed.  Site WNL.  AVS reviewed with patient.  Verbalized understanding of discharge instructions, physician follow-up, medications.  Patient reports belongings intact and in possession.  Patient transported by NT via w/c to main entrance at discharge.  Patient stable at time of discharge.

## 2018-09-08 NOTE — Discharge Summary (Signed)
Physician Discharge Summary   Patient ID: Shannon Lynch MRN: [id number] DOB/AGE: February 04, 1945 73 y.o.  Admit date: 09/05/2018 Discharge date: 09/08/2018  Primary Care Physician:  Thressa Sheller, MD (Inactive)   Recommendations for Outpatient Follow-up:  1. Follow up with PCP in 1-2 weeks 2. Please obtain BMP/CBC in one week  3. Continue Augmentin twice daily for 14 days per Dr. Graylon Good (infectious disease) 4. Per urology, chronic suppressive therapy for 3 months, Macrobid 100 mg nightly after the acute course is completed.  5.  Advanced cirrhotic changes involving the liver without any hepatic lesions seen on CT abdomen pelvis, needs further follow-up by PCP or referral to GI.  Home Health: Home health PT OT Equipment/Devices:   Discharge Condition: stable  CODE STATUS: FULL  Diet recommendation: Heart healthy diet   Discharge Diagnoses:   . Sepsis (HCC)-pyonephrosis Klebsiella, enterococcal UTI Left renal stone with obstruction status post stent Chronic follicular cystitis . Essential hypertension . Dehydration . AKI (acute kidney injury) ([place]) . Diarrhea   Consults:   Urology, Dr. Jeffie Pollock Infectious disease, Dr. Graylon Good via phone consultation    Allergies:   Allergies  Allergen Reactions  . Ciprofloxacin Hives  . Codeine Nausea And Vomiting  . Prednisone Other (See Comments)    Elevated glucose level to 700     DISCHARGE MEDICATIONS: Allergies as of 09/08/2018      Reactions   Ciprofloxacin Hives   Codeine Nausea And Vomiting   Prednisone Other (See Comments)   Elevated glucose level to 700      Medication List    STOP taking these medications   aspirin EC 81 MG tablet   telmisartan 80 MG tablet Commonly known as: MICARDIS     TAKE these medications   acetaminophen 325 MG tablet Commonly known as: TYLENOL Take 2 tablets (650 mg total) by mouth every 6 (six) hours as needed for mild pain (or Fever >/= 101).   amoxicillin-clavulanate  875-125 MG tablet Commonly known as: Augmentin Take 1 tablet by mouth 2 (two) times daily for 14 days.   carvedilol 6.25 MG tablet Commonly known as: COREG Take 1 tablet (6.25 mg total) by mouth 2 (two) times daily with a meal.   clorazepate 3.75 MG tablet Commonly known as: TRANXENE Take 3.75 mg by mouth 2 (two) times daily.   CRANBERRY PO Take 1 tablet by mouth 2 (two) times daily.   DULoxetine 60 MG capsule Commonly known as: CYMBALTA Take 60 mg by mouth daily.   metFORMIN 500 MG tablet Commonly known as: GLUCOPHAGE Take 1,000 mg by mouth daily.   ondansetron 8 MG tablet Commonly known as: ZOFRAN Take 1 tablet (8 mg total) by mouth every 8 (eight) hours as needed for nausea or vomiting.   polyvinyl alcohol 1.4 % ophthalmic solution Commonly known as: LIQUIFILM TEARS Place 1 drop into both eyes 3 (three) times daily as needed for dry eyes.   pravastatin 10 MG tablet Commonly known as: PRAVACHOL Take 10 mg by mouth daily.   tamsulosin 0.4 MG Caps capsule Commonly known as: FLOMAX Take 1 capsule (0.4 mg total) by mouth daily for 14 days.   vitamin C 500 MG tablet Commonly known as: ASCORBIC ACID Take 500 mg by mouth 2 (two) times daily.   Vitamin D 50 MCG (2000 UT) tablet Take 2,000 Units by mouth daily.        Brief H and P: For complete details please refer to admission H and P, but in brief 73 yo female  with history of HTN, DM-2, COPD not on J1,BJYNWGNFA UTI, diastolic CHF and ex-smoker presenting with nausea, vomiting and occasional diarrhea for past week that has gotten worse over the last few days. Admitted for sepsis due to pyelonephritis/pyelonephrosis complicated by left renal stone and hydronephrosis,and AKI.  Patient was started on IV antibiotics. She underwent stent placement by urology, Dr.Wrennon 09/05/2018.  Hospital Course:   Sepsis ([place]) secondary to pyonephrosis/pyelonephritis, left renal stone with obstruction, follicular cystitis,  UTI -Patient met sepsis criteria at the time of admission with tachycardia, leukocytosis, acute kidney injury, source likely due to pyelonephrosis/pyelonephritis  - urology was consulted, status post cystoscopy, insertion of left double-J stent  -Sepsis physiology resolved.  Patient was placed on IV Rocephin. -Creatinine function is improved, leukocytosis improved. -Transition to oral Augmentin for 14 days -Per urology, after acute course is completed, she will need 3 months of chronic suppressive therapy, nitrofurantoin for at least 3 months. Patient will need prescription from urology at the time of follow-up.   Acute Klebsiella and enterococcus lower UTI, pyelonephritis -Urine culture showed more than 100,000 colonies of Klebsiella and Enterococcus faecalis.   -Discussed with infectious disease, Dr. Graylon Good, recommended Augmentin twice daily for 14 days.   Chronic follicular cystitis -Urology recommended prolonged course of antibiotics for 3 months after the acute course, patient will need prescription from urology at the time of follow-up appointment  Nausea vomiting and diarrhea -Possibly due to #1 and gastroenteritis -Resolved  Acute kidney injury -Likely due to #1, prerenal insult, UTI, obstructive uropathy -Creatinine 1.8 at the time of admission, status post renal stent -Creatinine now improved, 0.97 at the time of discharge  Hypercalcemia -Likely due to dehydration, currently improving -Calcium 9.7 at the time of discharge  Hypomagnesemia Likely due to GI loss, was replaced on 8/11, currently 1.9  Essential hypertension continue Coreg, hydralazine BP stable, continue to hold Micardis for now  Diabetes mellitus type 2, NIDDM, uncontrolled with hyperglycemia -Restart metformin at home -Hemoglobin A1c 5.8  Chronic COPD -Currently stable, no wheezing  Chronic diastolic CHF -Euvolemic, stable, not on diuretics at home  Liver cirrhosis -Incidentally noted  on CT abdomen pelvis, does not drink alcohol -LFTs normal -Acute hepatitis panel negative  Liver cirrhosis: Noted on CT abdomen and pelvis. No history of this in the past. Not a drinker. -Hepatitis panel negative -May need outpatient evaluation with ultrasound of liver with elastography.  Defer to PCP, may need GI referral for further work-up  Obesity BMI 34.4, recommended lifestyle changes and weight control  Depression Continue Cymbalta  Day of Discharge S: *No acute complaints, tolerating diet, no fevers or chills.  BP 129/68 (BP Location: Left Arm)   Pulse 67   Temp 97.8 F (36.6 C) (Oral)   Resp 18   Ht '5\' 2"'  (1.575 m)   Wt 85.6 kg   LMP 06/23/1995   SpO2 95%   BMI 34.53 kg/m   Physical Exam: General: Alert and awake oriented x3 not in any acute distress. HEENT: anicteric sclera, pupils reactive to light and accommodation CVS: S1-S2 clear no murmur rubs or gallops Chest: clear to auscultation bilaterally, no wheezing rales or rhonchi Abdomen: soft nontender, nondistended, normal bowel sounds Extremities: no cyanosis, clubbing or edema noted bilaterally Neuro: Cranial nerves II-XII intact, no focal neurological deficits   The results of significant diagnostics from this hospitalization (including imaging, microbiology, ancillary and laboratory) are listed below for reference.      Procedures/Studies:  Ct Abdomen Pelvis Wo Contrast  Result Date: 09/05/2018  CLINICAL DATA:  Nausea, vomiting and diarrhea for 3 weeks. Abdominal cramping. EXAM: CT ABDOMEN AND PELVIS WITHOUT CONTRAST TECHNIQUE: Multidetector CT imaging of the abdomen and pelvis was performed following the standard protocol without IV contrast. COMPARISON:  None. FINDINGS: Lower chest: Elevation of the left hemidiaphragm with overlying vascular crowding and atelectasis. Right basilar scarring changes also noted. The heart is normal in size. No pericardial effusion. Advanced three-vessel coronary artery  calcifications are noted. The distal esophagus is grossly normal. Hepatobiliary: Advanced cirrhotic changes involving the liver but no focal hepatic lesion is identified without contrast. Periportal lymph nodes are noted and are typical with cirrhosis. The gallbladder appears normal. No common bile duct dilatation. Pancreas: No mass, inflammation or ductal dilatation. Spleen: Normal sized.  No focal lesions. Adrenals/Urinary Tract: The adrenal glands are unremarkable. There are several left renal calculi with a 12 x 12 mm calculus noted in the renal pelvis. There is mild to moderate hydronephrosis and this calculus could be intermittently obstructing near the UPJ. Mild perinephric stranding. No right-sided renal calculi small right renal cysts. No obstructing left or right ureteral calculi. No bladder calculi. No worrisome renal or bladder lesions without contrast. Stomach/Bowel: The stomach, duodenum, small bowel and colon are grossly normal without oral contrast. No acute inflammatory changes, mass lesions or obstructive findings. The terminal ileum and appendix are normal. Vascular/Lymphatic: Advanced atherosclerotic calcifications involving the aorta and branch vessel ostia, in particularly the left renal artery is heavily calcified at its origin. No focal aneurysm. Scattered retroperitoneal lymph nodes may be related to the patient's cirrhosis. Reproductive: The uterus and ovaries are unremarkable. Other: No pelvic mass or free pelvic fluid collections. No pelvic adenopathy. A few scattered pelvic lymph nodes are noted. Musculoskeletal: No significant bony findings. IMPRESSION: 1. Left-sided renal calculi with a 12 mm calculus in the renal pelvis which could be intermittently obstructing near the UPJ. There is mild to moderate left hydronephrosis. 2. Advanced cirrhotic changes involving the liver without obvious hepatic lesions without contrast. Periportal and retroperitoneal lymph nodes are likely related to  the patient's cirrhosis. 3. No acute abdominal/pelvic findings, mass lesions or overt lymphadenopathy. 4. Advanced atherosclerotic calcifications involving the aorta and branch vessels, in particular, the left renal artery at its origin and coronary arteries. Electronically Signed   By: Marijo Sanes M.D.   On: 09/05/2018 17:34   Dg Chest Portable 1 View  Result Date: 09/05/2018 CLINICAL DATA:  Shortness of breath EXAM: PORTABLE CHEST 1 VIEW COMPARISON:  12/13/2015 FINDINGS: Cardiac shadow is stable. Aortic calcifications are again seen. No acute infiltrate or sizable effusion is seen. No bony abnormality is noted. IMPRESSION: No active disease. Electronically Signed   By: Inez Catalina M.D.   On: 09/05/2018 17:21   Dg C-arm 1-60 Min-no Report  Result Date: 09/05/2018 Fluoroscopy was utilized by the requesting physician.  No radiographic interpretation.       LAB RESULTS: Basic Metabolic Panel: Recent Labs  Lab 09/07/18 0416 09/08/18 0346  NA 136 137  K 4.2 4.2  CL 99 103  CO2 27 22  GLUCOSE 138* 91  BUN 25* 18  CREATININE 1.04* 0.97  CALCIUM 10.0 9.7  MG 1.9  --   PHOS 2.7  --    Liver Function Tests: Recent Labs  Lab 09/06/18 0210 09/07/18 0416  AST 37 30  ALT 23 21  ALKPHOS 117 114  BILITOT 0.7 0.5  PROT 7.6 7.5  ALBUMIN 3.4* 3.3*   Recent Labs  Lab 09/05/18 1546  LIPASE  46   No results for input(s): AMMONIA in the last 168 hours. CBC: Recent Labs  Lab 09/07/18 0416 09/08/18 0346  WBC 12.5* 10.4  HGB 11.3* 10.5*  HCT 35.5* 34.5*  MCV 97.5 99.4  PLT 331 315   Cardiac Enzymes: No results for input(s): CKTOTAL, CKMB, CKMBINDEX, TROPONINI in the last 168 hours. BNP: Invalid input(s): POCBNP CBG: Recent Labs  Lab 09/08/18 0755 09/08/18 1148  GLUCAP 96 99      Disposition and Follow-up: Discharge Instructions    Diet - low sodium heart healthy   Complete by: As directed    Increase activity slowly   Complete by: As directed         DISPOSITION: Home with home health PT   DISCHARGE FOLLOW-UP Follow-up Information    [facility] Follow up on 09/14/2018.   Why: 1pm Contact information: [place] [phone]           Time coordinating discharge:  *40 minutes  Signed:   Estill Cotta M.D. [facility] 09/08/2018, 12:24 PM

## 2018-09-08 NOTE — Progress Notes (Signed)
Assisted pt to BR and back to bed with 1 assist and front wheeled walker. Denies c/o pain or discomfort. No acute distress noted.

## 2018-09-08 NOTE — Care Management Important Message (Signed)
Important Message  Patient Details IM Letter given to Kathrin Greathouse SW to present to the Patient Name: Shannon Lynch MRN: [id number] Date of Birth: December 08, 1945   Medicare Important Message Given:  Yes     Kerin Salen 09/08/2018, 10:31 AM

## 2018-09-08 NOTE — Progress Notes (Signed)
Care assumed. A/completed and agree with 1949 assessment. Assisted pt to BR and back to bed  with 1 assist and front wheeled walker. Denies c/o pain or discomfort.  No acute distress noted.

## 2018-09-11 LAB — CULTURE, BLOOD (ROUTINE X 2)
Culture: NO GROWTH
Culture: NO GROWTH
Special Requests: ADEQUATE

## 2018-09-13 ENCOUNTER — Other Ambulatory Visit: Payer: Self-pay

## 2018-09-13 NOTE — Patient Outreach (Signed)
[facility]) Care Management  09/13/2018  Shannon Lynch 1945/03/03 [id number]   Medication Adherence call to Mrs. Shannon Lynch Hippa Identifiers Verify spoke with patient she is past due on Telmisartan 80 mg patient explain she has been sick in the hospital patient is not taking Telmisartan any more she explain doctor took her off patient is under a different doctor care now,she has an appointment tomorrow and will ask doctor if she needs to continued taking it. Mrs. Shannon Lynch is showing past due under [place].  [facility] Management Direct Dial [phone]  Fax [phone] [email or web address]

## 2018-09-14 ENCOUNTER — Encounter (HOSPITAL_COMMUNITY): Payer: Self-pay | Admitting: *Deleted

## 2018-09-14 DIAGNOSIS — N200 Calculus of kidney: Secondary | ICD-10-CM | POA: Diagnosis not present

## 2018-09-14 DIAGNOSIS — N390 Urinary tract infection, site not specified: Secondary | ICD-10-CM | POA: Diagnosis not present

## 2018-09-14 NOTE — Progress Notes (Signed)
Spoke to patient via phone,history obtained,updated.  Bring blue folder,insurance cards,picture ID,designated driver and living will,POA, if desires (to be placed on chart). Reinforced no aspirin(instructions to hold aspirin per your doctor), ibuprofen products 72 hours prior to procedure. No vitamins or herbal medicines 7 days prior to procedure.   Follow laxative instructions provided by urologist (office) and in blue folder. Wear easy on/off clothing and no jewelry except wedding rings and ear rings. Leave all other valuables at home. Verbalizes understanding of instructions  No alcohol 24 hours prior to procedure.call md office if you have a cold,sorethroat or fever. Shower or bathe before your treatment. You may have clear liquids up to 4 hours prior to treatment,NO SOLIDS 6 hours prior to treatment. Hold your metformin the morning of your procedure. Please take your coreg the morning of your procedure.  SPOKE W/  patient     SCREENING SYMPTOMS OF COVID 19:   COUGH--no  RUNNY NOSE--- no  SORE THROAT---no  NASAL CONGESTION----no  SNEEZING----no  SHORTNESS OF BREATH---no  DIFFICULTY BREATHING---no  TEMP >100.0 -----no  UNEXPLAINED BODY ACHES------no CHILLS -------- no  HEADACHES ---------no  LOSS OF SMELL/ TASTE --------no    HAVE YOU OR ANY FAMILY MEMBER TRAVELLED PAST 14 DAYS OUT OF THE   COUNTY---no STATE----no COUNTRY----no  HAVE YOU OR ANY FAMILY MEMBER BEEN EXPOSED TO ANYONE WITH COVID 19? no

## 2018-09-14 NOTE — Progress Notes (Signed)
Called patient about ESWL on 09/19/2018. Patient has just returned from [facility] and states she is too tired and sick to talk right now. Will call back later.

## 2018-09-19 ENCOUNTER — Ambulatory Visit (HOSPITAL_COMMUNITY)
Admission: RE | Admit: 2018-09-19 | Discharge: 2018-09-19 | Disposition: A | Discharge status: Home / Self Care | Payer: Medicare Other | Attending: Urology | Admitting: Urology

## 2018-09-19 ENCOUNTER — Ambulatory Visit (HOSPITAL_COMMUNITY): Payer: Medicare Other

## 2018-09-19 ENCOUNTER — Encounter (HOSPITAL_COMMUNITY): Payer: Self-pay | Admitting: General Practice

## 2018-09-19 ENCOUNTER — Encounter (HOSPITAL_COMMUNITY)
Admission: RE | Disposition: A | Discharge status: Home / Self Care | Payer: Self-pay | Source: Home / Self Care | Attending: Urology

## 2018-09-19 DIAGNOSIS — E669 Obesity, unspecified: Secondary | ICD-10-CM | POA: Insufficient documentation

## 2018-09-19 DIAGNOSIS — Z79891 Long term (current) use of opiate analgesic: Secondary | ICD-10-CM | POA: Insufficient documentation

## 2018-09-19 DIAGNOSIS — Z7984 Long term (current) use of oral hypoglycemic drugs: Secondary | ICD-10-CM | POA: Diagnosis not present

## 2018-09-19 DIAGNOSIS — M1990 Unspecified osteoarthritis, unspecified site: Secondary | ICD-10-CM | POA: Insufficient documentation

## 2018-09-19 DIAGNOSIS — Z96653 Presence of artificial knee joint, bilateral: Secondary | ICD-10-CM | POA: Insufficient documentation

## 2018-09-19 DIAGNOSIS — N200 Calculus of kidney: Secondary | ICD-10-CM | POA: Diagnosis not present

## 2018-09-19 DIAGNOSIS — Z881 Allergy status to other antibiotic agents status: Secondary | ICD-10-CM | POA: Diagnosis not present

## 2018-09-19 DIAGNOSIS — Z6834 Body mass index (BMI) 34.0-34.9, adult: Secondary | ICD-10-CM | POA: Insufficient documentation

## 2018-09-19 DIAGNOSIS — E7800 Pure hypercholesterolemia, unspecified: Secondary | ICD-10-CM | POA: Diagnosis not present

## 2018-09-19 DIAGNOSIS — Z885 Allergy status to narcotic agent status: Secondary | ICD-10-CM | POA: Insufficient documentation

## 2018-09-19 DIAGNOSIS — Z79899 Other long term (current) drug therapy: Secondary | ICD-10-CM | POA: Diagnosis not present

## 2018-09-19 DIAGNOSIS — Z888 Allergy status to other drugs, medicaments and biological substances status: Secondary | ICD-10-CM | POA: Insufficient documentation

## 2018-09-19 DIAGNOSIS — E119 Type 2 diabetes mellitus without complications: Secondary | ICD-10-CM | POA: Diagnosis not present

## 2018-09-19 DIAGNOSIS — Z87891 Personal history of nicotine dependence: Secondary | ICD-10-CM | POA: Insufficient documentation

## 2018-09-19 DIAGNOSIS — J449 Chronic obstructive pulmonary disease, unspecified: Secondary | ICD-10-CM | POA: Diagnosis not present

## 2018-09-19 DIAGNOSIS — Z01818 Encounter for other preprocedural examination: Secondary | ICD-10-CM | POA: Diagnosis not present

## 2018-09-19 HISTORY — PX: EXTRACORPOREAL SHOCK WAVE LITHOTRIPSY: SHX1557

## 2018-09-19 LAB — GLUCOSE, CAPILLARY: Glucose-Capillary: 155 mg/dL — ABNORMAL HIGH (ref 70–99)

## 2018-09-19 IMAGING — CR ABDOMEN - 1 VIEW
1 series · 1 of 1 positions shown · non-contrast
Comparison: CT Abdomen and Pelvis [DATE].

CLINICAL DATA: 73-year-old female preoperative study for left side
kidney stone.

EXAM:
ABDOMEN - 1 VIEW

[t abdomen supine]
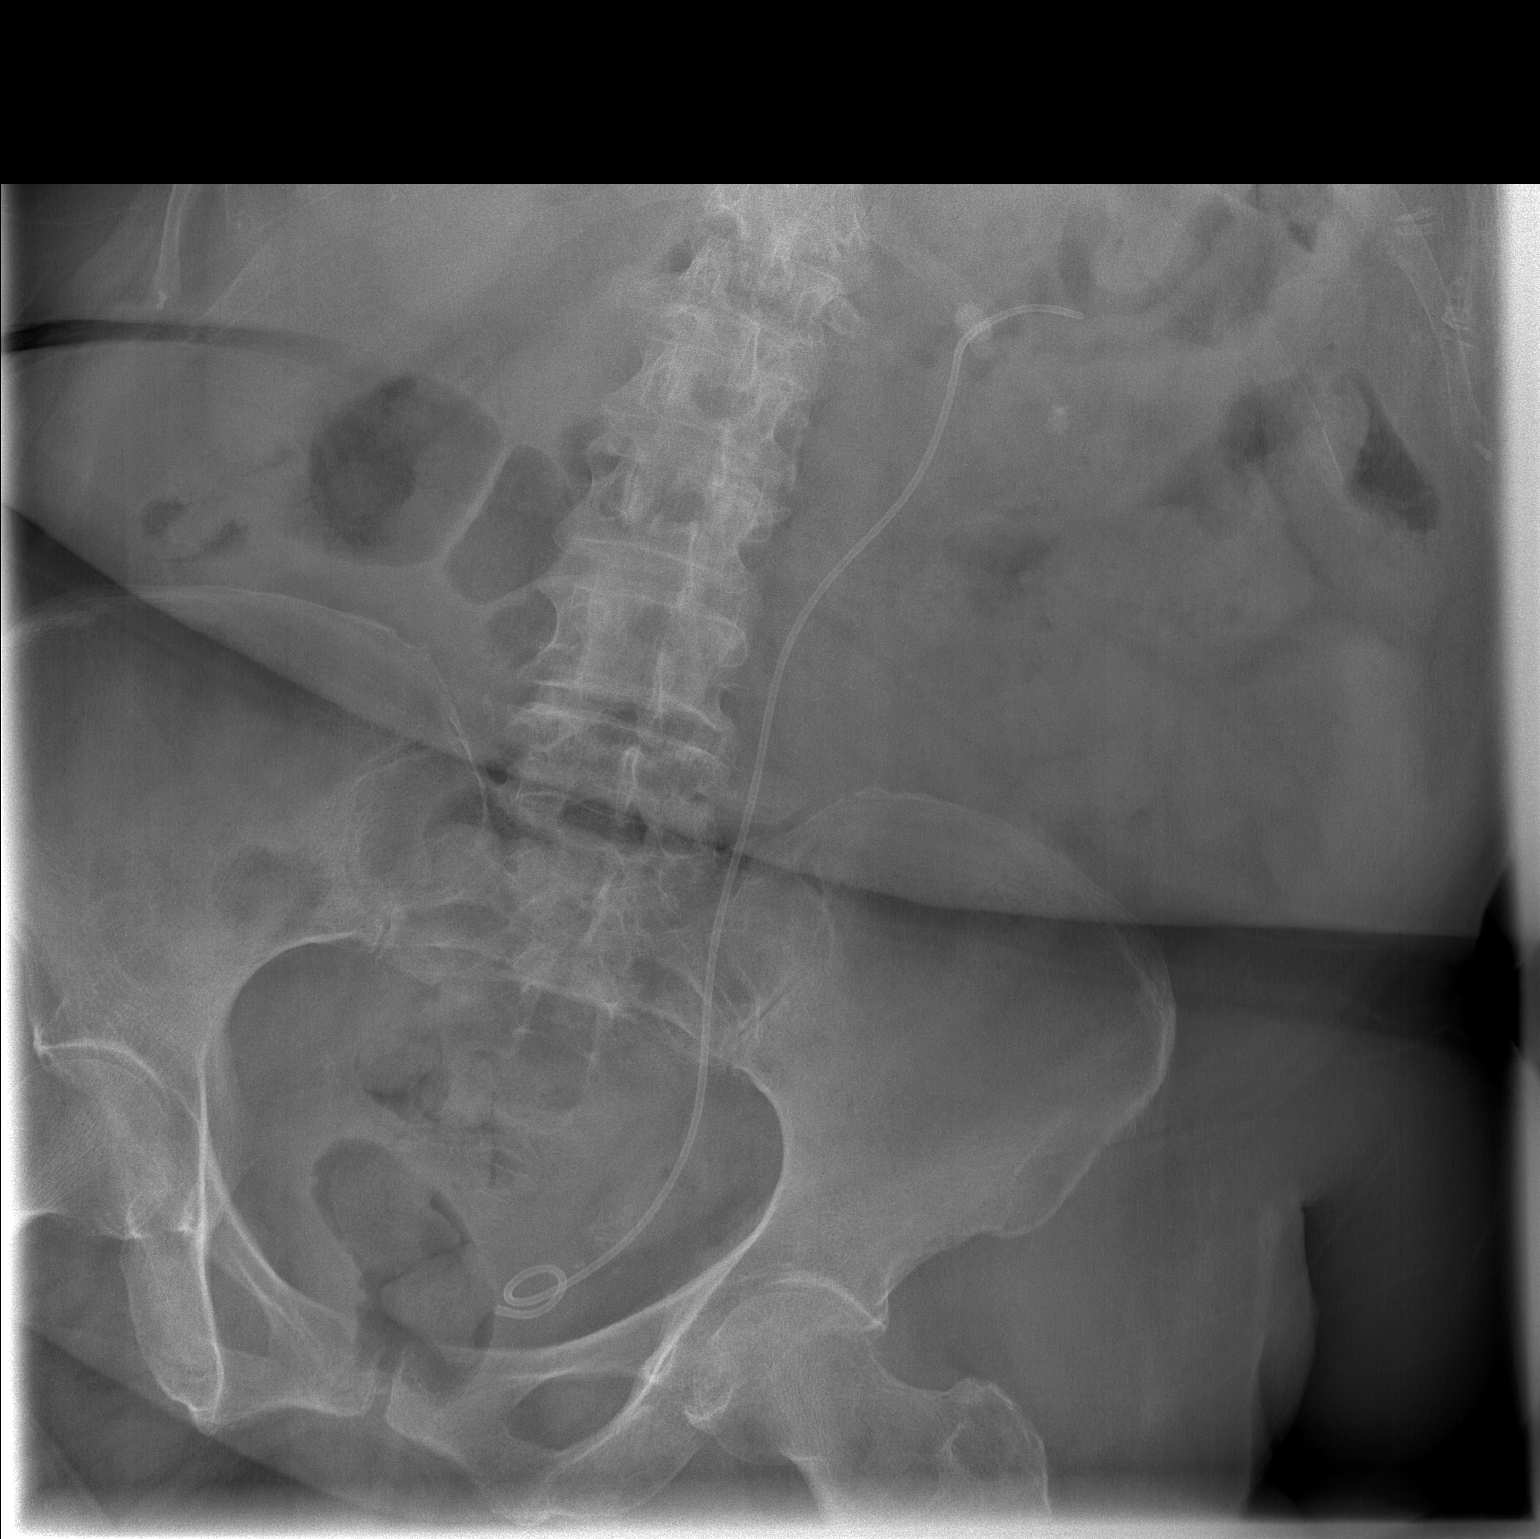

[1 of 1 positions shown; findings below may reference images not displayed]

FINDINGS: New left double-J ureteral stent is in place, although the proximal
pigtail is uncoiled. The distal pigtail appears appropriate.

Persistent left hilar calculus, was approximately 12 millimeters on
the prior CT. Persistent left lower pole region calculi.

Superimposed pelvic phleboliths suspected. As before no right side
calculus identified. Negative visible bowel gas pattern. No acute
osseous abnormality identified.
IMPRESSION: 1. New left double-J ureteral stent is in place, although the
proximal pigtail is uncoiled.
2. Stable multifocal left nephrolithiasis from the CT on [DATE].

## 2018-09-19 SURGERY — LITHOTRIPSY, ESWL
Anesthesia: LOCAL | Laterality: Left

## 2018-09-19 MED ORDER — DIAZEPAM 5 MG PO TABS
10.0000 mg | ORAL_TABLET | ORAL | Status: AC
  Administered 2018-09-19: 10 mg via ORAL
  Filled 2018-09-19: qty 2

## 2018-09-19 MED ORDER — DIPHENHYDRAMINE HCL 25 MG PO CAPS
25.0000 mg | ORAL_CAPSULE | ORAL | Status: AC
  Administered 2018-09-19: 25 mg via ORAL
  Filled 2018-09-19: qty 1

## 2018-09-19 MED ORDER — SODIUM CHLORIDE 0.9 % IV SOLN
INTRAVENOUS | Status: DC
  Administered 2018-09-19: 09:00:00 via INTRAVENOUS

## 2018-09-19 MED ORDER — SODIUM CHLORIDE 0.9 % IV SOLN
2.0000 g | Freq: Once | INTRAVENOUS | Status: AC
  Administered 2018-09-19: 10:00:00 2 g via INTRAVENOUS
  Filled 2018-09-19: qty 20

## 2018-09-19 MED ORDER — OXYCODONE-ACETAMINOPHEN 5-325 MG PO TABS: 1.0000 | ORAL_TABLET | ORAL | 0 refills | Status: DC | PRN

## 2018-09-19 NOTE — Op Note (Signed)
ESWL Operative Note  Treating Physician: Ellison Hughs, MD  Pre-op diagnosis: 12 mm left renal stone   Post-op diagnosis: Same   Procedure: LEFT ESWL  See Aris Everts OP note scanned into chart. Also because of the size, density, location and other factors that cannot be anticipated I feel this will likely be a staged procedure. This fact supersedes any indication in the scanned [place] stone operative note to the contrary

## 2018-09-19 NOTE — Discharge Instructions (Signed)
Dietary Guidelines to Help Prevent Kidney Stones Kidney stones are deposits of minerals and salts that form inside your kidneys. Your risk of developing kidney stones may be greater depending on your diet, your lifestyle, the medicines you take, and whether you have certain medical conditions. Most people can reduce their chances of developing kidney stones by following the instructions below. Depending on your overall health and the type of kidney stones you tend to develop, your dietitian may give you more specific instructions. What are tips for following this plan? Reading food labels  Choose foods with "no salt added" or "low-salt" labels. Limit your sodium intake to less than 1500 mg per day.  Choose foods with calcium for each meal and snack. Try to eat about 300 mg of calcium at each meal. Foods that contain 200-500 mg of calcium per serving include: ? 8 oz (237 ml) of milk, fortified nondairy milk, and fortified fruit juice. ? 8 oz (237 ml) of kefir, yogurt, and soy yogurt. ? 4 oz (118 ml) of tofu. ? 1 oz of cheese. ? 1 cup (300 g) of dried figs. ? 1 cup (91 g) of cooked broccoli. ? 1-3 oz can of sardines or mackerel.  Most people need 1000 to 1500 mg of calcium each day. Talk to your dietitian about how much calcium is recommended for you. Shopping  Buy plenty of fresh fruits and vegetables. Most people do not need to avoid fruits and vegetables, even if they contain nutrients that may contribute to kidney stones.  When shopping for convenience foods, choose: ? Whole pieces of fruit. ? Premade salads with dressing on the side. ? Low-fat fruit and yogurt smoothies.  Avoid buying frozen meals or prepared deli foods.  Look for foods with live cultures, such as yogurt and kefir. Cooking  Do not add salt to food when cooking. Place a salt shaker on the table and allow each person to add his or her own salt to taste.  Use vegetable protein, such as beans, textured vegetable  protein (TVP), or tofu instead of meat in pasta, casseroles, and soups. Meal planning   Eat less salt, if told by your dietitian. To do this: ? Avoid eating processed or premade food. ? Avoid eating fast food.  Eat less animal protein, including cheese, meat, poultry, or fish, if told by your dietitian. To do this: ? Limit the number of times you have meat, poultry, fish, or cheese each week. Eat a diet free of meat at least 2 days a week. ? Eat only one serving each day of meat, poultry, fish, or seafood. ? When you prepare animal protein, cut pieces into small portion sizes. For most meat and fish, one serving is about the size of one deck of cards.  Eat at least 5 servings of fresh fruits and vegetables each day. To do this: ? Keep fruits and vegetables on hand for snacks. ? Eat 1 piece of fruit or a handful of berries with breakfast. ? Have a salad and fruit at lunch. ? Have two kinds of vegetables at dinner.  Limit foods that are high in a substance called oxalate. These include: ? Spinach. ? Rhubarb. ? Beets. ? Potato chips and french fries. ? Nuts.  If you regularly take a diuretic medicine, make sure to eat at least 1-2 fruits or vegetables high in potassium each day. These include: ? Avocado. ? Banana. ? Orange, prune, carrot, or tomato juice. ? Baked potato. ? Cabbage. ? Beans and split   peas. General instructions   Drink enough fluid to keep your urine clear or pale yellow. This is the most important thing you can do.  Talk to your health care provider and dietitian about taking daily supplements. Depending on your health and the cause of your kidney stones, you may be advised: ? Not to take supplements with vitamin C. ? To take a calcium supplement. ? To take a daily probiotic supplement. ? To take other supplements such as magnesium, fish oil, or vitamin B6.  Take all medicines and supplements as told by your health care provider.  Limit alcohol intake to no  more than 1 drink a day for nonpregnant women and 2 drinks a day for men. One drink equals 12 oz of beer, 5 oz of wine, or 1 oz of hard liquor.  Lose weight if told by your health care provider. Work with your dietitian to find strategies and an eating plan that works best for you. What foods are not recommended? Limit your intake of the following foods, or as told by your dietitian. Talk to your dietitian about specific foods you should avoid based on the type of kidney stones and your overall health. Grains Breads. Bagels. Rolls. Baked goods. Salted crackers. Cereal. Pasta. Vegetables Spinach. Rhubarb. Beets. Canned vegetables. Pickles. Olives. Meats and other protein foods Nuts. Nut butters. Large portions of meat, poultry, or fish. Salted or cured meats. Deli meats. Hot dogs. Sausages. Dairy Cheese. Beverages Regular soft drinks. Regular vegetable juice. Seasonings and other foods Seasoning blends with salt. Salad dressings. Canned soups. Soy sauce. Ketchup. Barbecue sauce. Canned pasta sauce. Casseroles. Pizza. Lasagna. Frozen meals. Potato chips. French fries. Summary  You can reduce your risk of kidney stones by making changes to your diet.  The most important thing you can do is drink enough fluid. You should drink enough fluid to keep your urine clear or pale yellow.  Ask your health care provider or dietitian how much protein from animal sources you should eat each day, and also how much salt and calcium you should have each day. This information is not intended to replace advice given to you by your health care provider. Make sure you discuss any questions you have with your health care provider. Document Released: 05/09/2010 Document Revised: 05/04/2018 Document Reviewed: 12/24/2015 Elsevier Patient Education  2020 [employer]  

## 2018-09-19 NOTE — H&P (Signed)
PRE-OP H&P  CC: I have kidney stones.  HPI: Shannon Lynch is a 73 year-old female established patient who is here for renal calculi.  The problem is on the left side.   Elen returns today in f/u. She had a left ureteral stent placed on 09/05/18 for an obstructing 45mm left renal pelvic stone with sepsis and is scheduled for ESWL next Monday. Her culture grew enterococcus and klebsiella and she is being treated with Augmentin for 14 days. She was doing well when she left the hospital but about 3 days after discharge she began to have nausea and anorexia. She has had no fever. She has left back pain and some some left shoulder pain that she attributes to gas. She has had relief of constipation and doesn't have diarrhea but has more than one stool daily. She is voiding with mild dysuria but no other complaints.      ALLERGIES: Ciprofloxacin codeine Prednisone    MEDICATIONS: Metformin Hcl  Amoxicillin-Clavulanate Potass 875 mg-125 mg tablet 1 tablet PO Daily  Bupropion Xl 150 mg tablet, extended release 24 hr 1 tablet PO Daily  Carvedilol  Clorazepate Dipotassium 3.75 mg tablet 1 tablet PO Daily  Hydrocodone-Acetaminophen 5 mg-325 mg tablet 1 tablet PO Daily  Ondansetron Hcl 8 mg tablet 1 tablet PO Daily  Potassium  Tamsulosin Hcl 0.4 mg capsule 1 capsule PO Daily  Telmisartan 80 mg tablet 1 tablet PO Daily  Vitamin B12  Vitamin C  Vitamin D3     GU PSH: None   NON-GU PSH: Knee replacement, Bilateral     GU PMH: None   NON-GU PMH: Arthritis COPD Diabetes Type 2 Hypercholesterolemia    FAMILY HISTORY: No Family History    SOCIAL HISTORY: Marital Status: Unknown Preferred Language: English; Race: White Current Smoking Status: Patient does not smoke anymore.   Tobacco Use Assessment Completed: Used Tobacco in last 30 days?    REVIEW OF SYSTEMS:    GU Review Female:   Patient reports burning /pain with urination. Patient denies have to strain to urinate, hard to  postpone urination, get up at night to urinate, trouble starting your stream, leakage of urine, being pregnant, stream starts and stops, and frequent urination.  Gastrointestinal (Upper):   Patient reports nausea and vomiting. Patient denies indigestion/ heartburn.  Gastrointestinal (Lower):   Patient denies diarrhea and constipation.  Constitutional:   Patient denies fever, night sweats, weight loss, and fatigue.  Skin:   Patient denies skin rash/ lesion and itching.  Eyes:   Patient denies blurred vision and double vision.  Ears/ Nose/ Throat:   Patient denies sore throat and sinus problems.  Hematologic/Lymphatic:   Patient denies swollen glands and easy bruising.  Cardiovascular:   Patient denies leg swelling and chest pains.  Respiratory:   Patient denies cough and shortness of breath.  Endocrine:   Patient denies excessive thirst.  Musculoskeletal:   Patient reports back pain. Patient denies joint pain.  Neurological:   Patient denies headaches and dizziness.  Psychologic:   Patient denies depression and anxiety.   VITAL SIGNS:      09/14/2018 02:19 PM  Weight 187 lb / 84.82 kg  Height 62 in / 157.48 cm  BP 114/76 mmHg  Pulse 118 /min  Temperature 98.0 F / 36.6 C  BMI 34.2 kg/m   MULTI-SYSTEM PHYSICAL EXAMINATION:    Constitutional: Obese. No physical deformities. Normally developed. Good grooming.   Neck: Neck symmetrical, not swollen. Normal tracheal position.  Respiratory: Normal breath  sounds. No labored breathing, no use of accessory muscles.   Cardiovascular: Regular rate and rhythm. No murmur, no gallop.   Skin: No paleness, no jaundice, no cyanosis. No lesion, no ulcer, no rash.  Neurologic / Psychiatric: Oriented to time, oriented to place, oriented to person. No depression, no anxiety, no agitation.  Musculoskeletal: Normal gait and station of head and neck.     PAST DATA REVIEWED:  Source Of History:  Patient  Urine Test Review:   Urine Culture  X-Ray Review:  KUB: Reviewed Films. Discussed With Patient.     PROCEDURES:         KUB - [id number]  A single view of the abdomen is obtained. The stent is in good position and there is a 55mm renal pelvic stone and a 14mm LLP stone as seen on prior imaging. No signficant bone, gas or soft tissue abnormalities are note.d       Patient confirmed No Neulasta OnPro Device.            Urinalysis w/Scope Dipstick Dipstick Cont'd Micro  Color: Yellow Bilirubin: Neg mg/dL WBC/hpf: >60/hpf  Appearance: Cloudy Ketones: Neg mg/dL RBC/hpf: 3 - 10/hpf  Specific Gravity: 1.025 Blood: Neg ery/uL Bacteria: Many (>50/hpf)  pH: 6.0 Protein: 3+ mg/dL Cystals: Ca Oxalate  Glucose: Neg mg/dL Urobilinogen: 1.0 mg/dL Casts: Hyaline    Nitrites: Neg Trichomonas: Not Present    Leukocyte Esterase: 3+ leu/uL Mucous: Present      Epithelial Cells: 6 - 10/hpf      Yeast: NS (Not Seen)      Sperm: Not Present    ASSESSMENT:      ICD-10 Details  1 GU:   Renal calculus - N20.0 Left, She is for left ESWL on Monday and I reviewed the risks again. She will return for stent removal on 09/27/18. I reviewed the risks of ESWL including bleeding, infection, injury to the kidney or adjacent structures, failure to fragment the stone, need for ancillary procedures, thrombotic events, cardiac arrhythmias and sedation complications.   2   Urinary Tract Inf, Unspec site - N39.0 Her UA is abnormal today and I will get a culture today. She will cut the Augmentin back to once daily to see if that will help the nausea. She may need an additional antibiotic preop if the culture is positive. I will notify Dr. Lovena Neighbours.    PLAN:    -The patient has since started fluconazole in addition to Augmentin to treat her UTI. -The risks, benefits and alternatives of LEFT ESWL was discussed with the patient. I described the risks which include arrhythmia, kidney contusion, kidney hemorrhage, need for transfusion, back discomfort, flank ecchymosis, flank abrasion,  inability to fracture the stone, inability to pass stone fragments, Steinstrasse, infection associated with obstructing stones, need for an alternative surgical procedure and possible need for repeat shockwave lithotripsy.  The patient voices understanding and wishes to proceed.

## 2018-09-20 ENCOUNTER — Encounter (HOSPITAL_COMMUNITY): Payer: Self-pay | Admitting: Urology

## 2018-09-27 DIAGNOSIS — N200 Calculus of kidney: Secondary | ICD-10-CM | POA: Diagnosis not present

## 2018-10-04 ENCOUNTER — Other Ambulatory Visit: Payer: Self-pay

## 2018-10-04 NOTE — Patient Outreach (Signed)
[place] [facility]) Care Management  10/04/2018  Shannon Lynch Jun 16, 1945 [id number]   Medication Adherence call to Mrs. Shannon Lynch Identifiers Verify patient is past due on Telmisartan 80 mg patient explain she is not taking this medication and is waiting to see doctor and will ask if she need to continue on this medication.Mrs. Shannon Lynch is showing past due under [place].   [facility] Direct Dial [phone]  Fax [phone] [email or web address]

## 2018-10-25 DIAGNOSIS — E1122 Type 2 diabetes mellitus with diabetic chronic kidney disease: Secondary | ICD-10-CM | POA: Diagnosis not present

## 2018-10-25 DIAGNOSIS — E559 Vitamin D deficiency, unspecified: Secondary | ICD-10-CM | POA: Diagnosis not present

## 2018-10-25 DIAGNOSIS — I10 Essential (primary) hypertension: Secondary | ICD-10-CM | POA: Diagnosis not present

## 2018-10-25 DIAGNOSIS — R946 Abnormal results of thyroid function studies: Secondary | ICD-10-CM | POA: Diagnosis not present

## 2018-10-25 DIAGNOSIS — E785 Hyperlipidemia, unspecified: Secondary | ICD-10-CM | POA: Diagnosis not present

## 2018-10-28 DIAGNOSIS — E1122 Type 2 diabetes mellitus with diabetic chronic kidney disease: Secondary | ICD-10-CM | POA: Diagnosis not present

## 2018-10-28 DIAGNOSIS — Z0000 Encounter for general adult medical examination without abnormal findings: Secondary | ICD-10-CM | POA: Diagnosis not present

## 2018-10-28 DIAGNOSIS — I5032 Chronic diastolic (congestive) heart failure: Secondary | ICD-10-CM | POA: Diagnosis not present

## 2018-10-28 DIAGNOSIS — N200 Calculus of kidney: Secondary | ICD-10-CM | POA: Diagnosis not present

## 2018-11-02 DIAGNOSIS — N200 Calculus of kidney: Secondary | ICD-10-CM | POA: Diagnosis not present

## 2018-11-02 DIAGNOSIS — N390 Urinary tract infection, site not specified: Secondary | ICD-10-CM | POA: Diagnosis not present

## 2018-11-28 ENCOUNTER — Other Ambulatory Visit: Payer: Self-pay | Admitting: Family Medicine

## 2018-11-28 DIAGNOSIS — Z1231 Encounter for screening mammogram for malignant neoplasm of breast: Secondary | ICD-10-CM

## 2018-12-07 DIAGNOSIS — N200 Calculus of kidney: Secondary | ICD-10-CM | POA: Diagnosis not present

## 2018-12-07 DIAGNOSIS — N390 Urinary tract infection, site not specified: Secondary | ICD-10-CM | POA: Diagnosis not present

## 2019-01-17 ENCOUNTER — Ambulatory Visit: Payer: Medicare Other

## 2019-02-14 ENCOUNTER — Ambulatory Visit: Payer: Medicare Other | Attending: Internal Medicine

## 2019-02-14 ENCOUNTER — Ambulatory Visit: Payer: Medicare Other

## 2019-02-14 DIAGNOSIS — Z23 Encounter for immunization: Secondary | ICD-10-CM

## 2019-02-14 NOTE — Progress Notes (Signed)
   Covid-19 Vaccination Clinic  Name:  Shannon Lynch    MRN: [id number] DOB: Oct 04, 1945  02/14/2019  [name] was observed post Covid-19 immunization for 15 minutes without incidence. She was provided with Vaccine Information Sheet and instruction to access the V-Safe system.   [name] was instructed to call 911 with any severe reactions post vaccine: Marland Kitchen Difficulty breathing  . Swelling of your face and throat  . A fast heartbeat  . A bad rash all over your body  . Dizziness and weakness    Immunizations Administered    Name Date Dose VIS Date Route   Pfizer COVID-19 Vaccine 02/14/2019 11:48 AM 0.3 mL 01/06/2019 Intramuscular   Manufacturer: [place]   Lot: [id number]   [place]: [id number]

## 2019-02-15 ENCOUNTER — Ambulatory Visit: Payer: Medicare Other

## 2019-03-01 ENCOUNTER — Ambulatory Visit: Payer: Medicare Other

## 2019-03-03 DIAGNOSIS — N200 Calculus of kidney: Secondary | ICD-10-CM | POA: Diagnosis not present

## 2019-03-06 ENCOUNTER — Ambulatory Visit: Payer: Medicare Other | Attending: Internal Medicine

## 2019-03-06 DIAGNOSIS — Z23 Encounter for immunization: Secondary | ICD-10-CM

## 2019-03-06 NOTE — Progress Notes (Signed)
   Covid-19 Vaccination Clinic  Name:  RINKI JURKOWSKI    MRN: [id number] DOB: 1945/03/27  03/06/2019  [name] was observed post Covid-19 immunization for 15 minutes without incidence. She was provided with Vaccine Information Sheet and instruction to access the V-Safe system.   Ms. Jaillet was instructed to call 911 with any severe reactions post vaccine: Marland Kitchen Difficulty breathing  . Swelling of your face and throat  . A fast heartbeat  . A bad rash all over your body  . Dizziness and weakness    Immunizations Administered    Name Date Dose VIS Date Route   Pfizer COVID-19 Vaccine 03/06/2019 12:33 PM 0.3 mL 01/06/2019 Intramuscular   Manufacturer: [place]   Lot: [id number]   [place]: [id number]

## 2019-03-30 DIAGNOSIS — N390 Urinary tract infection, site not specified: Secondary | ICD-10-CM | POA: Diagnosis not present

## 2019-03-30 DIAGNOSIS — N200 Calculus of kidney: Secondary | ICD-10-CM | POA: Diagnosis not present

## 2019-03-31 ENCOUNTER — Other Ambulatory Visit: Payer: Self-pay | Admitting: Urology

## 2019-04-03 NOTE — Patient Instructions (Signed)
DUE TO COVID-19 ONLY ONE VISITOR IS ALLOWED TO COME WITH YOU AND STAY IN THE WAITING ROOM ONLY DURING PRE OP AND PROCEDURE DAY OF SURGERY. THE 1 VISITOR MAY VISIT WITH YOU AFTER SURGERY IN YOUR PRIVATE ROOM DURING VISITING HOURS ONLY!  YOU NEED TO HAVE A COVID 19 TEST ON__3/12_____ @__10 :00_____, THIS TEST MUST BE DONE BEFORE SURGERY, COME  [place], [place] , [address].  ([place]) ONCE YOUR COVID TEST IS COMPLETED, PLEASE BEGIN THE QUARANTINE INSTRUCTIONS AS OUTLINED IN YOUR HANDOUT.                Shannon Lynch   Your procedure is scheduled on: 04/11/19   Report to [facility] Main  Entrance   Report to admitting at   9:00 AM     Call this number if you have problems the morning of surgery [phone]    Remember: Do not eat food or drink liquids :After   Midnight. BRUSH YOUR TEETH MORNING OF SURGERY AND RINSE YOUR MOUTH OUT, NO CHEWING GUM CANDY OR MINTS.     Take these medicines the morning of surgery with A SIP OF WATER: Clorazepate, wellbutrin, Coreg, Prilosec, Eye drops  DO NOT TAKE ANY DIABETIC MEDICATIONS DAY OF YOUR SURGERY    How to Manage Your Diabetes Before and After Surgery  Why is it important to control my blood sugar before and after surgery? . Improving blood sugar levels before and after surgery helps healing and can limit problems. . A way of improving blood sugar control is eating a healthy diet by: o  Eating less sugar and carbohydrates o  Increasing activity/exercise o  Talking with your doctor about reaching your blood sugar goals . High blood sugars (greater than 180 mg/dL) can raise your risk of infections and slow your recovery, so you will need to focus on controlling your diabetes during the weeks before surgery. . Make sure that the doctor who takes care of your diabetes knows about your planned surgery including the date and location.  How do I manage my blood sugar before surgery? . Check your blood  sugar at least 4 times a day, starting 2 days before surgery, to make sure that the level is not too high or low. o Check your blood sugar the morning of your surgery when you wake up and every 2 hours until you get to the Short Stay unit. . If your blood sugar is less than 70 mg/dL, you will need to treat for low blood sugar: o Do not take insulin. o Treat a low blood sugar (less than 70 mg/dL) with  cup of clear juice (cranberry or apple), 4 glucose tablets, OR glucose gel. o Recheck blood sugar in 15 minutes after treatment (to make sure it is greater than 70 mg/dL). If your blood sugar is not greater than 70 mg/dL on recheck, call [phone] for further instructions. . Report your blood sugar to the short stay nurse when you get to Short Stay.  . If you are admitted to the hospital after surgery: o Your blood sugar will be checked by the staff and you will probably be given insulin after surgery (instead of oral diabetes medicines) to make sure you have good blood sugar levels. o The goal for blood sugar control after surgery is 80-180 mg/dL.   WHAT DO I DO ABOUT MY DIABETES MEDICATION?  Marland Kitchen Do not take oral diabetes medicines (pills) the morning of surgery.    ________________________________________________________________________  You may not have any metal on your body including hair pins and              piercings  Do not wear jewelry, make-up, lotions, powders or perfumes, deodorant             Do not wear nail polish on your fingernails.  Do not shave  48 hours prior to surgery.        .   Do not bring valuables to the hospital. [place].  Contacts, dentures or bridgework may not be worn into surgery.       Patients discharged the day of surgery will not be allowed to drive home.   IF YOU ARE HAVING SURGERY AND GOING HOME THE SAME DAY, YOU MUST HAVE AN ADULT TO DRIVE YOU HOME AND BE WITH YOU FOR  24 HOURS.   YOU MAY GO HOME BY TAXI OR UBER OR ORTHERWISE, BUT AN ADULT MUST ACCOMPANY YOU HOME AND STAY WITH YOU FOR 24 HOURS.  Name and phone number of your driver:  Special Instructions: N/A              Please read over the following fact sheets you were given: _____________________________________________________________________             [facility] - Preparing for Surgery Before surgery, you can play an important role.   Because skin is not sterile, your skin needs to be as free of germs as possible.   You can reduce the number of germs on your skin by washing with CHG (chlorahexidine gluconate) soap before surgery.   CHG is an antiseptic cleaner which kills germs and bonds with the skin to continue killing germs even after washing. Please DO NOT use if you have an allergy to CHG or antibacterial soaps.   If your skin becomes reddened/irritated stop using the CHG and inform your nurse when you arrive at Short Stay. Do not shave (including legs and underarms) for at least 48 hours prior to the first CHG shower.    Please follow these instructions carefully:  1.  Shower with CHG Soap the night before surgery and the  morning of Surgery.  2.  If you choose to wash your hair, wash your hair first as usual with your  normal  shampoo.  3.  After you shampoo, rinse your hair and body thoroughly to remove the  shampoo.                                        4.  Use CHG as you would any other liquid soap.  You can apply chg directly  to the skin and wash                       Gently with a scrungie or clean washcloth.  5.  Apply the CHG Soap to your body ONLY FROM THE NECK DOWN.   Do not use on face/ open                           Wound or open sores. Avoid contact with eyes, ears mouth and genitals (private parts).  Wash face,  Genitals (private parts) with your normal soap.             6.  Wash thoroughly, paying special attention to the area where your surgery   will be performed.  7.  Thoroughly rinse your body with warm water from the neck down.  8.  DO NOT shower/wash with your normal soap after using and rinsing off  the CHG Soap.             9.  Pat yourself dry with a clean towel.            10.  Wear clean pajamas.            11.  Place clean sheets on your bed the night of your first shower and do not  sleep with pets. Day of Surgery : Do not apply any lotions/deodorants the morning of surgery.  Please wear clean clothes to the hospital/surgery center.  FAILURE TO FOLLOW THESE INSTRUCTIONS MAY RESULT IN THE CANCELLATION OF YOUR SURGERY PATIENT SIGNATURE_________________________________  NURSE SIGNATURE__________________________________  ________________________________________________________________________

## 2019-04-05 ENCOUNTER — Encounter (HOSPITAL_COMMUNITY): Payer: Self-pay

## 2019-04-05 ENCOUNTER — Other Ambulatory Visit: Payer: Self-pay

## 2019-04-05 ENCOUNTER — Encounter (HOSPITAL_COMMUNITY)
Admission: RE | Admit: 2019-04-05 | Discharge: 2019-04-05 | Disposition: A | Discharge status: Home / Self Care | Payer: Medicare Other | Source: Ambulatory Visit | Attending: Urology | Admitting: Urology

## 2019-04-05 ENCOUNTER — Ambulatory Visit: Payer: Medicare Other

## 2019-04-05 DIAGNOSIS — Z01812 Encounter for preprocedural laboratory examination: Secondary | ICD-10-CM | POA: Insufficient documentation

## 2019-04-05 HISTORY — DX: Dyspnea, unspecified: R06.00

## 2019-04-05 HISTORY — DX: Personal history of urinary calculi: Z87.442

## 2019-04-05 NOTE — Progress Notes (Signed)
PCP - Janie Morning Cardiologist - no  Chest x-ray - 09/05/18 EKG - 09/01/18 Stress Test - 2015 ECHO - 2015 Cardiac Cath - no  Sleep Study - no CPAP -   Fasting Blood Sugar - 110-140 Checks Blood Sugar _____ times a day every other month  Blood Thinner Instructions:NA Aspirin Instructions: Last Dose:  Anesthesia review:   Patient denies shortness of breath, fever, cough and chest pain at PAT appointment Pt has had a cough for 3 weeks. On antibiotics  Patient verbalized understanding of instructions that were given to them at the PAT appointment. Patient was also instructed that they will need to review over the PAT instructions again at home before surgery. yes

## 2019-04-06 ENCOUNTER — Encounter (HOSPITAL_COMMUNITY)
Admission: RE | Admit: 2019-04-06 | Discharge: 2019-04-06 | Disposition: A | Discharge status: Home / Self Care | Payer: Medicare Other | Source: Ambulatory Visit | Attending: Urology | Admitting: Urology

## 2019-04-06 DIAGNOSIS — Z01812 Encounter for preprocedural laboratory examination: Secondary | ICD-10-CM | POA: Diagnosis not present

## 2019-04-06 LAB — BASIC METABOLIC PANEL
Anion gap: 10 (ref 5–15)
BUN: 26 mg/dL — ABNORMAL HIGH (ref 8–23)
CO2: 21 mmol/L — ABNORMAL LOW (ref 22–32)
Calcium: 10.3 mg/dL (ref 8.9–10.3)
Chloride: 108 mmol/L (ref 98–111)
Creatinine, Ser: 1.23 mg/dL — ABNORMAL HIGH (ref 0.44–1.00)
GFR calc Af Amer: 50 mL/min — ABNORMAL LOW (ref >60)
GFR calc non Af Amer: 43 mL/min — ABNORMAL LOW (ref >60)
Glucose, Bld: 115 mg/dL — ABNORMAL HIGH (ref 70–99)
Potassium: 5 mmol/L (ref 3.5–5.1)
Sodium: 139 mmol/L (ref 135–145)

## 2019-04-06 LAB — CBC
HCT: 37.9 % (ref 36.0–46.0)
Hemoglobin: 11.6 g/dL — ABNORMAL LOW (ref 12.0–15.0)
MCH: 30.1 pg (ref 26.0–34.0)
MCHC: 30.6 g/dL (ref 30.0–36.0)
MCV: 98.2 fL (ref 80.0–100.0)
Platelets: 365 10*3/uL (ref 150–400)
RBC: 3.86 MIL/uL — ABNORMAL LOW (ref 3.87–5.11)
RDW: 14.5 % (ref 11.5–15.5)
WBC: 10.4 10*3/uL (ref 4.0–10.5)
nRBC: 0 % (ref 0.0–0.2)

## 2019-04-06 LAB — HEMOGLOBIN A1C
Hgb A1c MFr Bld: 5.1 % (ref 4.8–5.6)
Mean Plasma Glucose: 99.67 mg/dL

## 2019-04-06 LAB — GLUCOSE, CAPILLARY: Glucose-Capillary: 106 mg/dL — ABNORMAL HIGH (ref 70–99)

## 2019-04-07 ENCOUNTER — Other Ambulatory Visit (HOSPITAL_COMMUNITY)
Admission: RE | Admit: 2019-04-07 | Discharge: 2019-04-07 | Disposition: A | Discharge status: Home / Self Care | Payer: Medicare Other | Source: Ambulatory Visit | Attending: Urology | Admitting: Urology

## 2019-04-07 DIAGNOSIS — Z01812 Encounter for preprocedural laboratory examination: Secondary | ICD-10-CM | POA: Diagnosis not present

## 2019-04-07 DIAGNOSIS — Z20822 Contact with and (suspected) exposure to covid-19: Secondary | ICD-10-CM | POA: Insufficient documentation

## 2019-04-07 LAB — SARS CORONAVIRUS 2 (TAT 6-24 HRS): SARS Coronavirus 2: NEGATIVE

## 2019-04-10 MED ORDER — GENTAMICIN SULFATE 40 MG/ML IJ SOLN
320.0000 mg | Freq: Once | INTRAVENOUS | Status: AC
  Administered 2019-04-11: 320 mg via INTRAVENOUS
  Filled 2019-04-10: qty 8

## 2019-04-11 ENCOUNTER — Encounter (HOSPITAL_COMMUNITY): Payer: Self-pay | Admitting: Urology

## 2019-04-11 ENCOUNTER — Ambulatory Visit (HOSPITAL_COMMUNITY): Payer: Medicare Other

## 2019-04-11 ENCOUNTER — Ambulatory Visit (HOSPITAL_COMMUNITY): Payer: Medicare Other | Admitting: Anesthesiology

## 2019-04-11 ENCOUNTER — Ambulatory Visit (HOSPITAL_COMMUNITY)
Admission: RE | Admit: 2019-04-11 | Discharge: 2019-04-11 | Disposition: A | Discharge status: Home / Self Care | Payer: Medicare Other | Attending: Urology | Admitting: Urology

## 2019-04-11 ENCOUNTER — Encounter (HOSPITAL_COMMUNITY)
Admission: RE | Disposition: A | Discharge status: Home / Self Care | Payer: Self-pay | Source: Home / Self Care | Attending: Urology

## 2019-04-11 ENCOUNTER — Ambulatory Visit (HOSPITAL_COMMUNITY): Payer: Medicare Other | Admitting: Physician Assistant

## 2019-04-11 DIAGNOSIS — Z7984 Long term (current) use of oral hypoglycemic drugs: Secondary | ICD-10-CM | POA: Diagnosis not present

## 2019-04-11 DIAGNOSIS — J449 Chronic obstructive pulmonary disease, unspecified: Secondary | ICD-10-CM | POA: Diagnosis not present

## 2019-04-11 DIAGNOSIS — Z881 Allergy status to other antibiotic agents status: Secondary | ICD-10-CM | POA: Insufficient documentation

## 2019-04-11 DIAGNOSIS — F329 Major depressive disorder, single episode, unspecified: Secondary | ICD-10-CM | POA: Insufficient documentation

## 2019-04-11 DIAGNOSIS — Z96653 Presence of artificial knee joint, bilateral: Secondary | ICD-10-CM | POA: Insufficient documentation

## 2019-04-11 DIAGNOSIS — Z882 Allergy status to sulfonamides status: Secondary | ICD-10-CM | POA: Diagnosis not present

## 2019-04-11 DIAGNOSIS — I10 Essential (primary) hypertension: Secondary | ICD-10-CM | POA: Diagnosis not present

## 2019-04-11 DIAGNOSIS — Z79899 Other long term (current) drug therapy: Secondary | ICD-10-CM | POA: Diagnosis not present

## 2019-04-11 DIAGNOSIS — Z87440 Personal history of urinary (tract) infections: Secondary | ICD-10-CM | POA: Insufficient documentation

## 2019-04-11 DIAGNOSIS — Z888 Allergy status to other drugs, medicaments and biological substances status: Secondary | ICD-10-CM | POA: Insufficient documentation

## 2019-04-11 DIAGNOSIS — N3946 Mixed incontinence: Secondary | ICD-10-CM | POA: Insufficient documentation

## 2019-04-11 DIAGNOSIS — Z885 Allergy status to narcotic agent status: Secondary | ICD-10-CM | POA: Insufficient documentation

## 2019-04-11 DIAGNOSIS — Z87891 Personal history of nicotine dependence: Secondary | ICD-10-CM | POA: Diagnosis not present

## 2019-04-11 DIAGNOSIS — Z9889 Other specified postprocedural states: Secondary | ICD-10-CM | POA: Insufficient documentation

## 2019-04-11 DIAGNOSIS — F419 Anxiety disorder, unspecified: Secondary | ICD-10-CM | POA: Insufficient documentation

## 2019-04-11 DIAGNOSIS — E119 Type 2 diabetes mellitus without complications: Secondary | ICD-10-CM | POA: Insufficient documentation

## 2019-04-11 DIAGNOSIS — N200 Calculus of kidney: Secondary | ICD-10-CM | POA: Insufficient documentation

## 2019-04-11 HISTORY — PX: CYSTOSCOPY/URETEROSCOPY/HOLMIUM LASER/STENT PLACEMENT: SHX6546

## 2019-04-11 LAB — GLUCOSE, CAPILLARY
Glucose-Capillary: 130 mg/dL — ABNORMAL HIGH (ref 70–99)
Glucose-Capillary: 99 mg/dL (ref 70–99)

## 2019-04-11 SURGERY — CYSTOSCOPY/URETEROSCOPY/HOLMIUM LASER/STENT PLACEMENT
Anesthesia: General | Laterality: Left

## 2019-04-11 MED ORDER — ONDANSETRON HCL 4 MG/2ML IJ SOLN: 4.0000 mg | Freq: Once | INTRAMUSCULAR | Status: DC | PRN

## 2019-04-11 MED ORDER — IOHEXOL 300 MG/ML  SOLN
INTRAMUSCULAR | Status: DC | PRN
  Administered 2019-04-11: 7 mL

## 2019-04-11 MED ORDER — PROPOFOL 10 MG/ML IV BOLUS
INTRAVENOUS | Status: AC
  Filled 2019-04-11: qty 20

## 2019-04-11 MED ORDER — MIDAZOLAM HCL 2 MG/2ML IJ SOLN
INTRAMUSCULAR | Status: DC | PRN
  Administered 2019-04-11: 2 mg via INTRAVENOUS

## 2019-04-11 MED ORDER — SODIUM CHLORIDE 0.9% FLUSH: 3.0000 mL | INTRAVENOUS | Status: DC | PRN

## 2019-04-11 MED ORDER — ONDANSETRON HCL 4 MG/2ML IJ SOLN
INTRAMUSCULAR | Status: DC | PRN
  Administered 2019-04-11: 4 mg via INTRAVENOUS

## 2019-04-11 MED ORDER — ACETAMINOPHEN 325 MG PO TABS
650.0000 mg | ORAL_TABLET | ORAL | Status: DC | PRN
  Administered 2019-04-11: 650 mg via ORAL

## 2019-04-11 MED ORDER — OXYCODONE HCL 5 MG PO TABS: 5.0000 mg | ORAL_TABLET | Freq: Once | ORAL | Status: DC | PRN

## 2019-04-11 MED ORDER — FENTANYL CITRATE (PF) 100 MCG/2ML IJ SOLN: 25.0000 ug | INTRAMUSCULAR | Status: DC | PRN

## 2019-04-11 MED ORDER — SODIUM CHLORIDE 0.9 % IV SOLN: 250.0000 mL | INTRAVENOUS | Status: DC | PRN

## 2019-04-11 MED ORDER — TRAMADOL HCL 50 MG PO TABS: 50.0000 mg | ORAL_TABLET | Freq: Four times a day (QID) | ORAL | 0 refills | Status: AC | PRN

## 2019-04-11 MED ORDER — FENTANYL CITRATE (PF) 250 MCG/5ML IJ SOLN
INTRAMUSCULAR | Status: DC | PRN
  Administered 2019-04-11 (×2): 50 ug via INTRAVENOUS

## 2019-04-11 MED ORDER — ACETAMINOPHEN 325 MG PO TABS
ORAL_TABLET | ORAL | Status: AC
  Filled 2019-04-11: qty 2

## 2019-04-11 MED ORDER — MORPHINE SULFATE (PF) 4 MG/ML IV SOLN: 2.0000 mg | INTRAVENOUS | Status: DC | PRN

## 2019-04-11 MED ORDER — OXYCODONE HCL 5 MG PO TABS: 5.0000 mg | ORAL_TABLET | ORAL | Status: DC | PRN

## 2019-04-11 MED ORDER — LIDOCAINE 2% (20 MG/ML) 5 ML SYRINGE
INTRAMUSCULAR | Status: AC
  Filled 2019-04-11: qty 5

## 2019-04-11 MED ORDER — SODIUM CHLORIDE 0.9 % IR SOLN
Status: DC | PRN
  Administered 2019-04-11: 3000 mL via INTRAVESICAL

## 2019-04-11 MED ORDER — SODIUM CHLORIDE 0.9% FLUSH: 3.0000 mL | Freq: Two times a day (BID) | INTRAVENOUS | Status: DC

## 2019-04-11 MED ORDER — FENTANYL CITRATE (PF) 250 MCG/5ML IJ SOLN
INTRAMUSCULAR | Status: AC
  Filled 2019-04-11: qty 5

## 2019-04-11 MED ORDER — ACETAMINOPHEN 650 MG RE SUPP
650.0000 mg | RECTAL | Status: DC | PRN
  Filled 2019-04-11: qty 1

## 2019-04-11 MED ORDER — SODIUM CHLORIDE 0.9 % IV SOLN
2.0000 g | INTRAVENOUS | Status: AC
  Administered 2019-04-11: 2 g via INTRAVENOUS
  Filled 2019-04-11: qty 20

## 2019-04-11 MED ORDER — NITROFURANTOIN MONOHYD MACRO 100 MG PO CAPS: 100.0000 mg | ORAL_CAPSULE | Freq: Every day | ORAL | 0 refills | Status: AC

## 2019-04-11 MED ORDER — PHENYLEPHRINE 40 MCG/ML (10ML) SYRINGE FOR IV PUSH (FOR BLOOD PRESSURE SUPPORT)
PREFILLED_SYRINGE | INTRAVENOUS | Status: DC | PRN
  Administered 2019-04-11: 100 ug via INTRAVENOUS
  Administered 2019-04-11: 80 ug via INTRAVENOUS
  Administered 2019-04-11 (×2): 100 ug via INTRAVENOUS

## 2019-04-11 MED ORDER — PROPOFOL 10 MG/ML IV BOLUS
INTRAVENOUS | Status: DC | PRN
  Administered 2019-04-11: 120 mg via INTRAVENOUS

## 2019-04-11 MED ORDER — OXYCODONE HCL 5 MG/5ML PO SOLN: 5.0000 mg | Freq: Once | ORAL | Status: DC | PRN

## 2019-04-11 MED ORDER — LIDOCAINE 2% (20 MG/ML) 5 ML SYRINGE
INTRAMUSCULAR | Status: DC | PRN
  Administered 2019-04-11: 50 mg via INTRAVENOUS

## 2019-04-11 MED ORDER — LACTATED RINGERS IV SOLN: INTRAVENOUS | Status: DC

## 2019-04-11 MED ORDER — MIDAZOLAM HCL 2 MG/2ML IJ SOLN
INTRAMUSCULAR | Status: AC
  Filled 2019-04-11: qty 2

## 2019-04-11 MED ORDER — 0.9 % SODIUM CHLORIDE (POUR BTL) OPTIME
TOPICAL | Status: DC | PRN
  Administered 2019-04-11: 1000 mL

## 2019-04-11 MED ORDER — ONDANSETRON HCL 4 MG/2ML IJ SOLN
INTRAMUSCULAR | Status: AC
  Filled 2019-04-11: qty 2

## 2019-04-11 SURGICAL SUPPLY — 24 items
BAG URO CATCHER STRL LF (MISCELLANEOUS) ×3 IMPLANT
BASKET STONE NCOMPASS (UROLOGICAL SUPPLIES) IMPLANT
CATH URET 5FR 28IN OPEN ENDED (CATHETERS) IMPLANT
CATH URET DUAL LUMEN 6-10FR 50 (CATHETERS) ×3 IMPLANT
CLOTH BEACON ORANGE TIMEOUT ST (SAFETY) ×3 IMPLANT
EXTRACTOR STONE NITINOL NGAGE (UROLOGICAL SUPPLIES) ×3 IMPLANT
FIBER LASER FLEXIVA 1000 (UROLOGICAL SUPPLIES) IMPLANT
FIBER LASER FLEXIVA 365 (UROLOGICAL SUPPLIES) IMPLANT
FIBER LASER FLEXIVA 550 (UROLOGICAL SUPPLIES) IMPLANT
FIBER LASER TRAC TIP (UROLOGICAL SUPPLIES) ×2 IMPLANT
GLOVE SURG SS PI 8.0 STRL IVOR (GLOVE) IMPLANT
GOWN STRL REUS W/TWL XL LVL3 (GOWN DISPOSABLE) ×3 IMPLANT
GUIDEWIRE STR DUAL SENSOR (WIRE) ×3 IMPLANT
IV NS 1000ML (IV SOLUTION) ×3
IV NS 1000ML BAXH (IV SOLUTION) ×1 IMPLANT
IV NS IRRIG 3000ML ARTHROMATIC (IV SOLUTION) ×3 IMPLANT
KIT TURNOVER KIT A (KITS) IMPLANT
MANIFOLD NEPTUNE II (INSTRUMENTS) ×3 IMPLANT
PACK CYSTO (CUSTOM PROCEDURE TRAY) ×3 IMPLANT
SHEATH URETERAL 12FRX35CM (MISCELLANEOUS) ×3 IMPLANT
STENT URET 6FRX24 CONTOUR (STENTS) ×2 IMPLANT
TUBING CONNECTING 10 (TUBING) ×2 IMPLANT
TUBING CONNECTING 10' (TUBING) ×1
TUBING UROLOGY SET (TUBING) ×3 IMPLANT

## 2019-04-11 NOTE — Discharge Instructions (Signed)

## 2019-04-11 NOTE — Anesthesia Preprocedure Evaluation (Signed)
Anesthesia Evaluation  Patient identified by MRN, date of birth, ID band Patient awake    Reviewed: Allergy & Precautions, NPO status , Patient's Chart, lab work & pertinent test results, reviewed documented beta blocker date and time   Airway Mallampati: II  TM Distance: >3 FB Neck ROM: Full    Dental  (+) Poor Dentition, Chipped, Missing, Dental Advisory Given   Pulmonary shortness of breath and with exertion, COPD,  COPD inhaler, former smoker,    Pulmonary exam normal breath sounds clear to auscultation + decreased breath sounds      Cardiovascular hypertension, Pt. on medications and Pt. on home beta blockers Normal cardiovascular exam Rhythm:Regular Rate:Tachycardia  EKG 09/05/2018 ST, [facility]  Echo 01/22/2014 Left ventricle: The cavity size was normal. Wall thickness was increased in a pattern of moderate LVH. There was focal basal hypertrophy. Systolic function was normal. The estimated ejection fraction was in the range of 55% to 60%. Wall motion was normal; there were no regional wall motion abnormalities. Dopplerparameters are consistent with abnormal left ventricular relaxation (grade 1 diastolic dysfunction).  - Mitral valve: Mildly to moderately calcified annulus.      Neuro/Psych PSYCHIATRIC DISORDERS Anxiety Depression negative neurological ROS     GI/Hepatic negative GI ROS, Neg liver ROS,   Endo/Other  diabetes, Well Controlled, Type 2, Oral Hypoglycemic AgentsMorbid obesityHyperlipidemia  Renal/GU Renal InsufficiencyRenal diseaseLeft renal calculus UPJ  negative genitourinary   Musculoskeletal  (+) Arthritis , Osteoarthritis,    Abdominal (+) + obese,   Peds  Hematology   Anesthesia Other Findings   Reproductive/Obstetrics                             Anesthesia Physical  Anesthesia Plan  ASA: III and emergent  Anesthesia Plan: General   Post-op Pain Management:     Induction: Intravenous  PONV Risk Score and Plan: 4 or greater and Ondansetron, Dexamethasone and Treatment may vary due to age or medical condition  Airway Management Planned: LMA  Additional Equipment:   Intra-op Plan:   Post-operative Plan: Extubation in OR  Informed Consent: I have reviewed the patients History and Physical, chart, labs and discussed the procedure including the risks, benefits and alternatives for the proposed anesthesia with the patient or authorized representative who has indicated his/her understanding and acceptance.     Dental advisory given  Plan Discussed with: CRNA and Surgeon  Anesthesia Plan Comments:         Anesthesia Quick Evaluation

## 2019-04-11 NOTE — Anesthesia Procedure Notes (Signed)
Procedure Name: LMA Insertion Date/Time: 04/11/2019 11:19 AM Performed by: Pilar Grammes, CRNA Pre-anesthesia Checklist: Patient identified, Emergency Drugs available, Suction available, Patient being monitored and Timeout performed Patient Re-evaluated:Patient Re-evaluated prior to induction Oxygen Delivery Method: Circle system utilized Preoxygenation: Pre-oxygenation with 100% oxygen Induction Type: IV induction Ventilation: Mask ventilation without difficulty LMA: LMA inserted LMA Size: 5.0 Number of attempts: 1 Airway Equipment and Method: Stylet Placement Confirmation: positive ETCO2,  CO2 detector and breath sounds checked- equal and bilateral Tube secured with: Tape Dental Injury: Teeth and Oropharynx as per pre-operative assessment

## 2019-04-11 NOTE — H&P (Signed)
I have kidney stones.  HPI: Shannon Lynch is a 74 year-old female established patient who is here for renal calculi.  The problem is on the left side.   03/30/19: Shannon Lynch returns today in f/u for her history of stones. She had a CT on 2/5 and has 9 left renal stones with a 40mm renal pelvic stone. She has had intermittent pain. She has chronic incontinence and recurrent UTI's and she feels she has a UTI today.    12/07/18: Shannon Lynch returns today in f/u. She has passed a few more fragments. She has no left flank pain. She remains on keflex for suppression but her UA looks infected today. KUB today shows residual left renal stones but the number appears reduced. She has no voiding complaints other than chronic incontinence. She is on vesicare 10mg . She has been on tamsulosin but doesn't think she is anymore.    11/02/18: Shannon Lynch had a left ESWL on 8/24 after a stent was placed. She had the stent removed on 10-21-2018 and has passed a low of sand. She has no flank pain but she has some low back pain. She has no dysuria. Her culture on 10/21/2022 grew enterobacter and she got bactrim. She had been given fluconazole previously. Her UA looks infected today. She also has marked incontinence with stress and urge. She wears 3 ppd. she has nocturia 3-4x and frequency 4x. She was given a med for OAB by gynecology but doesn't know what that is.    2018/10/21: Patient with below noted history. She returns today for KUB and possible left ureteral stent removal. She underwent treatment of left renal pelvic calculus on 08/24. Urine culture from 08/19 was positive for yeast. She remained on Augmentin through her procedure and was treated accordingly with fluconazole, as well. Today she states that she stopped Augmentin approximately 3 days ago as she was unable to tolerate it any longer. She continued to have persistent GI symptoms with its use. In particular, ongoing anorexia and nausea. She has been better able to tolerate fluids in  oral intake today. She continues to have some intermittent generalized back pain and left flank pain. She has mild dysuria today. She denies gross hematuria, exacerbation of lower urinary tract symptoms, fever, or chills.   09/14/18: Shannon Lynch returns today in f/u. She had a left ureteral stent placed on 09/05/18 for an obstructing 53mm left renal pelvic stone with sepsis and is scheduled for ESWL next Monday. Her culture grew enterococcus and klebsiella and she is being treated with Augmentin for 14 days. She was doing well when she left the hospital but about 3 days after discharge she began to have nausea and anorexia. She has had no fever. She has left back pain and some some left shoulder pain that she attributes to gas. She has had relief of constipation and doesn't have diarrhea but has more than one stool daily. She is voiding with mild dysuria but no other complaints.     ALLERGIES: Ciprofloxacin codeine Prednisone Sulfa-methoxazole    MEDICATIONS: Metformin Hcl  Omeprazole  Bupropion Xl 150 mg tablet, extended release 24 hr 1 tablet PO Daily  Carvedilol  Clorazepate Dipotassium 3.75 mg tablet 1 tablet PO Daily  Ondansetron Hcl 8 mg tablet 1 tablet PO Daily  Potassium  Solifenacin Succinate 10 mg tablet 1 tablet PO Daily  Tamsulosin Hcl 0.4 mg capsule 1 capsule PO Daily  Telmisartan 80 mg tablet 1 tablet PO Daily  Vitamin B12  Vitamin C  Vitamin D3  GU PSH: Cysto Remove Stent FB Sim - 09/27/2018 Cystoscopy Insert Stent, Left - 09/05/2018 ESWL, Left - 09/19/2018     NON-GU PSH: Knee replacement, Bilateral     GU PMH: Renal calculus, She has some residual stone burden. I discussed options and will have her return in 68months for a CT stone study. - 12/07/2018, She has residual fragments in the left kidney but no symptoms. I will have her try to do some more postural drainage maneuvers. She will return with a KUB in 1 month. , - 11/02/2018, - 09/27/2018, Left, She is for left ESWL on  Monday and I reviewed the risks again. She will return for stent removal on 09/27/18. I reviewed the risks of ESWL including bleeding, infection, injury to the kidney or adjacent structures, failure to fragment the stone, need for ancillary procedures, thrombotic events, cardiac arrhythmias and sedation complications. , - 09/14/2018 Mixed incontinence, She has fairly severe incontinence and been started on a new med but can't recall what it is. We will contact the pharmacy to find out. - 11/02/2018 Urinary Tract Inf, Unspec site, Her UA is abnormal today and I will get a culture today. She will cut the Augmentin back to once daily to see if that will help the nausea. She may need an additional antibiotic preop if the culture is positive. I will notify Dr. Lovena Neighbours. - 09/14/2018    NON-GU PMH: Arthritis COPD Diabetes Type 2 Hypercholesterolemia    FAMILY HISTORY: None   SOCIAL HISTORY: Marital Status: Unknown Preferred Language: English; Race: White Current Smoking Status: Patient does not smoke anymore.   Tobacco Use Assessment Completed: Used Tobacco in last 30 days?    REVIEW OF SYSTEMS:    GU Review Female:   Patient reports frequent urination, hard to postpone urination, get up at night to urinate, and leakage of urine. Patient denies burning /pain with urination, stream starts and stops, trouble starting your stream, have to strain to urinate, and being pregnant.  Gastrointestinal (Upper):   Patient denies nausea, vomiting, and indigestion/ heartburn.  Gastrointestinal (Lower):   Patient reports diarrhea. Patient denies constipation.  Constitutional:   Patient denies fever, night sweats, weight loss, and fatigue.  Skin:   Patient denies skin rash/ lesion and itching.  Eyes:   Patient denies blurred vision and double vision.  Ears/ Nose/ Throat:   Patient denies sore throat and sinus problems.  Hematologic/Lymphatic:   Patient denies swollen glands and easy bruising.  Cardiovascular:    Patient denies leg swelling and chest pains.  Respiratory:   Patient reports cough. Patient denies shortness of breath.  Endocrine:   Patient denies excessive thirst.  Musculoskeletal:   Patient reports back pain. Patient denies joint pain.  Neurological:   Patient denies headaches and dizziness.  Psychologic:   Patient denies depression and anxiety.   Notes: Unable to leave specimen today.     VITAL SIGNS:      03/30/2019 03:50 PM  Weight 175 lb / 79.38 kg  Height 64 in / 162.56 cm  BP 107/68 mmHg  Pulse 86 /min  Temperature 96.8 F / 36 C  BMI 30.0 kg/m   MULTI-SYSTEM PHYSICAL EXAMINATION:    Constitutional: Obese. No physical deformities. Normally developed. Good grooming.   Respiratory: Normal breath sounds. No labored breathing, no use of accessory muscles.   Cardiovascular: Regular rate and rhythm. No murmur, no gallop.      PAST DATA REVIEWED:  Source Of History:  Patient  Records Review:  Previous Patient Records  Urine Test Review:   Urinalysis  X-Ray Review: KUB: Reviewed Films. Discussed With Patient.     PROCEDURES:         KUB - [id number]  A single view of the abdomen is obtained. There is a 7-18mm stone over the UPJ and small stones in the upper, mid and lower poles on the left. No right renal stones are seen. She has no signficant bone, gas or soft tissue abnormalities. .       Patient confirmed No Neulasta OnPro Device.           Urinalysis w/Scope Dipstick Dipstick Cont'd Micro  Color: Amber Bilirubin: Neg mg/dL WBC/hpf: >60/hpf  Appearance: Cloudy Ketones: Trace mg/dL RBC/hpf: 3 - 10/hpf  Specific Gravity: 1.030 Blood: 1+ ery/uL Bacteria: Mod (26-50/hpf)  pH: <=5.0 Protein: 3+ mg/dL Cystals: NS (Not Seen)  Glucose: Neg mg/dL Urobilinogen: 1.0 mg/dL Casts: NS (Not Seen)    Nitrites: Neg Trichomonas: Not Present    Leukocyte Esterase: 3+ leu/uL Mucous: Present      Epithelial Cells: 0 - 5/hpf      Yeast: NS (Not Seen)      Sperm: Not Present     Notes: microscopic not concentrated    ASSESSMENT:      ICD-10 Details  1 GU:   Renal calculus - N20.0 Chronic, Stable - She has a 51mm UPJ fragment and additional upper, mid and lower left renal stones. I discussed options and will get her set up for URS but explained that it might be a staged procedure. I have reviewed the risks of ureteroscopy including bleeding, infection, ureteral injury, need for a stent or secondary procedures, thrombotic events and anesthetic complications.   2   Urinary Tract Inf, Unspec site - N39.0 Chronic, Stable - Urine culture today. I will want to treat her infection prior to ureteroscopy.   3   Mixed incontinence - N39.46 Chronic, Stable - She is bothered by the MUI and would like it treated but that will need to wait until we have completed her stone management.    PLAN:           Orders Labs Urine Culture          Schedule Return Visit/Planned Activity: 1-2 Weeks - Schedule Surgery

## 2019-04-11 NOTE — Transfer of Care (Signed)
Immediate Anesthesia Transfer of Care Note  Patient: Shannon Lynch  Procedure(s) Performed: CYSTOSCOPY LEFT RETROGRADE /URETEROSCOPY/HOLMIUM LASER/STENT PLACEMENT (Left )  Patient Location: PACU  Anesthesia Type:General  Level of Consciousness: awake, oriented, drowsy and patient cooperative  Airway & Oxygen Therapy: Patient Spontanous Breathing and Patient connected to face mask oxygen  Post-op Assessment: Report given to RN and Post -op Vital signs reviewed and stable  Post vital signs: Reviewed and stable  Last Vitals:  Vitals Value Taken Time  BP    Temp    Pulse 108 04/11/19 1224  Resp 16 04/11/19 1224  SpO2 100 % 04/11/19 1224  Vitals shown include unvalidated device data.  Last Pain:  Vitals:   04/11/19 0934  TempSrc: Oral         Complications: No apparent anesthesia complications

## 2019-04-11 NOTE — Anesthesia Postprocedure Evaluation (Signed)
Anesthesia Post Note  Patient: Shannon Lynch  Procedure(s) Performed: CYSTOSCOPY LEFT RETROGRADE /URETEROSCOPY/HOLMIUM LASER/STENT PLACEMENT (Left )     Patient location during evaluation: PACU Anesthesia Type: General Level of consciousness: awake and alert and oriented Pain management: pain level controlled Vital Signs Assessment: post-procedure vital signs reviewed and stable Respiratory status: spontaneous breathing, nonlabored ventilation and respiratory function stable Cardiovascular status: blood pressure returned to baseline and stable Postop Assessment: no apparent nausea or vomiting Anesthetic complications: no    Last Vitals:  Vitals:   04/11/19 1224 04/11/19 1230  BP:    Pulse: (!) 108 (!) 104  Resp: 16 15  Temp: (!) 36.3 C   SpO2: 100% 100%    Last Pain:  Vitals:   04/11/19 1230  TempSrc:   PainSc: 0-No pain                 Ronelle Michie A.

## 2019-04-11 NOTE — Op Note (Signed)
Procedure: 1.  Cystoscopy with left retrograde pyelogram and interpretation. 2.  Left ureteroscopic stone extraction with holmium laser application and insertion of left ureteral stent. 3.  Application of fluoroscopy.  Preop diagnosis: Multiple left renal stones.  Postop diagnosis: Same.  Surgeon: Dr. Irine Seal.  Anesthesia: General.  Drain: 6 French by 24 cm contour left double-J stent.  Specimen: Stone fragments, given to patient.  EBL: None.  Complications: None.  Indications: The patient is a 74 year old white female with a history of stones with prior obstruction and sepsis who was initially treated with stenting followed by lithotripsy but on follow-up imaging she had some fragmentation but minimal passage of the stone and it was felt that ureteroscopy was indicated.  She has been on antibiotic prophylaxis with Keflex.  Procedure: She was given gentamicin and Rocephin.  A general anesthetic was induced.  She was placed in the lithotomy position and fitted with PAS hose.  Her perineum and genitalia were prepped with Betadine solution and she was draped in usual sterile fashion.  Cystoscopy was performed using a 23 [place] scope and 30 degree lens.  Examination revealed a normal urethra.  The bladder wall had mild trabeculation.  There were no tumors, stones or inflammation noted.  Ureteral orifices were unremarkable.  The left ureteral orifice was cannulated with a 5 [place] open-ended catheter and contrast was instilled.  The left retrograde pyelogram demonstrated a normal caliber ureter to the level of the UPJ which was relatively tight with a very small renal pelvis with a filling defect consistent with her 7 mm renal pelvic stone.  She had elongated calyceal infundibula with scattered filling defects in the mid, upper and lower poles consistent with her stones.  A sensor wire was passed to the kidney and was negotiated into the upper pole without difficulty.  The open-ended  catheter and cystoscopy were then removed.  A 35 cm digital access sheath inner core was then passed over the wire and was advanced beyond the stone into the upper pole without difficulty.  The assembled sheath was then advanced once again without difficulty to the level just below the UPJ.  The wire and inner core were removed.  The dual-lumen digital flexible scope was then advanced to the sheath and passed into the renal pelvis where the larger stone was identified.  The stone was then engaged with the 200 m tract tip holmium laser fiber initially set on 0.5 J and 10 Hz with an eventual increase in the rate to 20 Hz.  The stone was then broken into manageable fragments which were removed using the engage basket.  I then removed multiple additional stones from the upper, mid and lower calyces with occasional additional utilization of the laser but most of the stones had been appropriately fragmented by the lithotripsy.  Once all significant stones have been removed by visual and fluoroscopic inspection the ureteroscope was removed and the sensor wire was replaced to the upper pole.  The sheath was removed carefully.  The cystoscope was then reinserted over the wire and a 6 [place] by 24 cm contour double-J stent was advanced the kidney under fluoroscopic guidance.  The wire was removed, leaving a good coil in the kidney and a good coil in the bladder.  The bladder was then drained and the cystoscope was removed.  She was taken down from the lithotomy position, her anesthetic was reversed and she was moved to the recovery room in stable condition.  There were no complications.  She was given her stone fragments.

## 2019-04-25 DIAGNOSIS — N200 Calculus of kidney: Secondary | ICD-10-CM | POA: Diagnosis not present

## 2019-05-09 DIAGNOSIS — E1122 Type 2 diabetes mellitus with diabetic chronic kidney disease: Secondary | ICD-10-CM | POA: Diagnosis not present

## 2019-05-09 DIAGNOSIS — I129 Hypertensive chronic kidney disease with stage 1 through stage 4 chronic kidney disease, or unspecified chronic kidney disease: Secondary | ICD-10-CM | POA: Diagnosis not present

## 2019-05-10 DIAGNOSIS — B379 Candidiasis, unspecified: Secondary | ICD-10-CM | POA: Diagnosis not present

## 2019-05-10 DIAGNOSIS — E1122 Type 2 diabetes mellitus with diabetic chronic kidney disease: Secondary | ICD-10-CM | POA: Diagnosis not present

## 2019-05-10 DIAGNOSIS — Z87442 Personal history of urinary calculi: Secondary | ICD-10-CM | POA: Diagnosis not present

## 2019-05-24 DIAGNOSIS — H40013 Open angle with borderline findings, low risk, bilateral: Secondary | ICD-10-CM | POA: Diagnosis not present

## 2019-05-24 DIAGNOSIS — H16223 Keratoconjunctivitis sicca, not specified as Sjogren's, bilateral: Secondary | ICD-10-CM | POA: Diagnosis not present

## 2019-05-24 DIAGNOSIS — H04123 Dry eye syndrome of bilateral lacrimal glands: Secondary | ICD-10-CM | POA: Diagnosis not present

## 2019-05-24 DIAGNOSIS — H26491 Other secondary cataract, right eye: Secondary | ICD-10-CM | POA: Diagnosis not present

## 2019-05-26 DIAGNOSIS — H16223 Keratoconjunctivitis sicca, not specified as Sjogren's, bilateral: Secondary | ICD-10-CM | POA: Diagnosis not present

## 2019-05-26 DIAGNOSIS — H04123 Dry eye syndrome of bilateral lacrimal glands: Secondary | ICD-10-CM | POA: Diagnosis not present

## 2019-05-26 DIAGNOSIS — H26491 Other secondary cataract, right eye: Secondary | ICD-10-CM | POA: Diagnosis not present

## 2019-05-26 DIAGNOSIS — E119 Type 2 diabetes mellitus without complications: Secondary | ICD-10-CM | POA: Diagnosis not present

## 2019-06-14 DIAGNOSIS — N200 Calculus of kidney: Secondary | ICD-10-CM | POA: Diagnosis not present

## 2019-06-14 DIAGNOSIS — N390 Urinary tract infection, site not specified: Secondary | ICD-10-CM | POA: Diagnosis not present

## 2019-06-15 DIAGNOSIS — N200 Calculus of kidney: Secondary | ICD-10-CM | POA: Diagnosis not present

## 2019-06-15 DIAGNOSIS — E790 Hyperuricemia without signs of inflammatory arthritis and tophaceous disease: Secondary | ICD-10-CM | POA: Diagnosis not present

## 2019-06-21 DIAGNOSIS — M25511 Pain in right shoulder: Secondary | ICD-10-CM | POA: Diagnosis not present

## 2019-06-28 DIAGNOSIS — N200 Calculus of kidney: Secondary | ICD-10-CM | POA: Diagnosis not present

## 2019-09-06 DIAGNOSIS — M25511 Pain in right shoulder: Secondary | ICD-10-CM | POA: Diagnosis not present

## 2019-09-06 DIAGNOSIS — M19011 Primary osteoarthritis, right shoulder: Secondary | ICD-10-CM | POA: Diagnosis not present

## 2019-10-04 DIAGNOSIS — N200 Calculus of kidney: Secondary | ICD-10-CM | POA: Diagnosis not present

## 2019-10-04 DIAGNOSIS — N390 Urinary tract infection, site not specified: Secondary | ICD-10-CM | POA: Diagnosis not present

## 2019-11-23 DIAGNOSIS — L309 Dermatitis, unspecified: Secondary | ICD-10-CM | POA: Diagnosis not present

## 2019-11-23 DIAGNOSIS — J069 Acute upper respiratory infection, unspecified: Secondary | ICD-10-CM | POA: Diagnosis not present

## 2020-04-10 DIAGNOSIS — E785 Hyperlipidemia, unspecified: Secondary | ICD-10-CM | POA: Diagnosis not present

## 2020-04-10 DIAGNOSIS — Z87442 Personal history of urinary calculi: Secondary | ICD-10-CM | POA: Diagnosis not present

## 2020-04-10 DIAGNOSIS — I10 Essential (primary) hypertension: Secondary | ICD-10-CM | POA: Diagnosis not present

## 2020-04-10 DIAGNOSIS — E559 Vitamin D deficiency, unspecified: Secondary | ICD-10-CM | POA: Diagnosis not present

## 2020-04-10 DIAGNOSIS — N390 Urinary tract infection, site not specified: Secondary | ICD-10-CM | POA: Diagnosis not present

## 2020-04-10 DIAGNOSIS — E1122 Type 2 diabetes mellitus with diabetic chronic kidney disease: Secondary | ICD-10-CM | POA: Diagnosis not present

## 2020-04-10 DIAGNOSIS — R946 Abnormal results of thyroid function studies: Secondary | ICD-10-CM | POA: Diagnosis not present

## 2020-04-10 DIAGNOSIS — I129 Hypertensive chronic kidney disease with stage 1 through stage 4 chronic kidney disease, or unspecified chronic kidney disease: Secondary | ICD-10-CM | POA: Diagnosis not present

## 2020-04-24 DIAGNOSIS — L98499 Non-pressure chronic ulcer of skin of other sites with unspecified severity: Secondary | ICD-10-CM | POA: Diagnosis not present

## 2020-04-24 DIAGNOSIS — Z79899 Other long term (current) drug therapy: Secondary | ICD-10-CM | POA: Diagnosis not present

## 2020-04-24 DIAGNOSIS — E1121 Type 2 diabetes mellitus with diabetic nephropathy: Secondary | ICD-10-CM | POA: Diagnosis not present

## 2020-04-24 DIAGNOSIS — Z87442 Personal history of urinary calculi: Secondary | ICD-10-CM | POA: Diagnosis not present

## 2020-04-24 DIAGNOSIS — Z0000 Encounter for general adult medical examination without abnormal findings: Secondary | ICD-10-CM | POA: Diagnosis not present

## 2020-04-24 DIAGNOSIS — N390 Urinary tract infection, site not specified: Secondary | ICD-10-CM | POA: Diagnosis not present

## 2020-04-24 DIAGNOSIS — E785 Hyperlipidemia, unspecified: Secondary | ICD-10-CM | POA: Diagnosis not present

## 2020-04-24 DIAGNOSIS — K219 Gastro-esophageal reflux disease without esophagitis: Secondary | ICD-10-CM | POA: Diagnosis not present

## 2020-04-24 DIAGNOSIS — E1122 Type 2 diabetes mellitus with diabetic chronic kidney disease: Secondary | ICD-10-CM | POA: Diagnosis not present

## 2020-10-08 DIAGNOSIS — N390 Urinary tract infection, site not specified: Secondary | ICD-10-CM | POA: Diagnosis not present

## 2020-10-08 DIAGNOSIS — Z87442 Personal history of urinary calculi: Secondary | ICD-10-CM | POA: Diagnosis not present

## 2020-10-08 DIAGNOSIS — K760 Fatty (change of) liver, not elsewhere classified: Secondary | ICD-10-CM | POA: Diagnosis not present

## 2020-10-08 DIAGNOSIS — E1122 Type 2 diabetes mellitus with diabetic chronic kidney disease: Secondary | ICD-10-CM | POA: Diagnosis not present

## 2020-10-08 DIAGNOSIS — D473 Essential (hemorrhagic) thrombocythemia: Secondary | ICD-10-CM | POA: Diagnosis not present

## 2020-10-08 DIAGNOSIS — E785 Hyperlipidemia, unspecified: Secondary | ICD-10-CM | POA: Diagnosis not present

## 2020-10-17 DIAGNOSIS — N390 Urinary tract infection, site not specified: Secondary | ICD-10-CM | POA: Diagnosis not present

## 2020-10-17 DIAGNOSIS — E1122 Type 2 diabetes mellitus with diabetic chronic kidney disease: Secondary | ICD-10-CM | POA: Diagnosis not present

## 2020-10-17 DIAGNOSIS — Z87442 Personal history of urinary calculi: Secondary | ICD-10-CM | POA: Diagnosis not present

## 2020-10-17 DIAGNOSIS — M8580 Other specified disorders of bone density and structure, unspecified site: Secondary | ICD-10-CM | POA: Diagnosis not present

## 2020-10-17 DIAGNOSIS — B372 Candidiasis of skin and nail: Secondary | ICD-10-CM | POA: Diagnosis not present

## 2020-10-17 DIAGNOSIS — Z23 Encounter for immunization: Secondary | ICD-10-CM | POA: Diagnosis not present

## 2020-10-17 DIAGNOSIS — R059 Cough, unspecified: Secondary | ICD-10-CM | POA: Diagnosis not present

## 2020-10-17 DIAGNOSIS — K760 Fatty (change of) liver, not elsewhere classified: Secondary | ICD-10-CM | POA: Diagnosis not present

## 2020-10-17 DIAGNOSIS — E559 Vitamin D deficiency, unspecified: Secondary | ICD-10-CM | POA: Diagnosis not present

## 2021-06-11 ENCOUNTER — Other Ambulatory Visit: Payer: Self-pay

## 2021-06-11 ENCOUNTER — Other Ambulatory Visit (HOSPITAL_BASED_OUTPATIENT_CLINIC_OR_DEPARTMENT_OTHER): Payer: Self-pay

## 2021-06-11 ENCOUNTER — Emergency Department (HOSPITAL_BASED_OUTPATIENT_CLINIC_OR_DEPARTMENT_OTHER)
Admission: EM | Admit: 2021-06-11 | Discharge: 2021-06-11 | Disposition: A | Discharge status: Home / Self Care | Payer: Medicare Other | Attending: Emergency Medicine | Admitting: Emergency Medicine

## 2021-06-11 ENCOUNTER — Encounter (HOSPITAL_BASED_OUTPATIENT_CLINIC_OR_DEPARTMENT_OTHER): Payer: Self-pay | Admitting: Emergency Medicine

## 2021-06-11 ENCOUNTER — Emergency Department (HOSPITAL_BASED_OUTPATIENT_CLINIC_OR_DEPARTMENT_OTHER): Payer: Medicare Other

## 2021-06-11 DIAGNOSIS — N390 Urinary tract infection, site not specified: Secondary | ICD-10-CM | POA: Diagnosis not present

## 2021-06-11 DIAGNOSIS — Z79899 Other long term (current) drug therapy: Secondary | ICD-10-CM | POA: Insufficient documentation

## 2021-06-11 DIAGNOSIS — I10 Essential (primary) hypertension: Secondary | ICD-10-CM | POA: Diagnosis not present

## 2021-06-11 DIAGNOSIS — J449 Chronic obstructive pulmonary disease, unspecified: Secondary | ICD-10-CM | POA: Diagnosis not present

## 2021-06-11 DIAGNOSIS — R309 Painful micturition, unspecified: Secondary | ICD-10-CM | POA: Diagnosis present

## 2021-06-11 DIAGNOSIS — N200 Calculus of kidney: Secondary | ICD-10-CM | POA: Diagnosis not present

## 2021-06-11 LAB — URINALYSIS, ROUTINE W REFLEX MICROSCOPIC
Glucose, UA: 100 mg/dL — AB
Hgb urine dipstick: NEGATIVE
Ketones, ur: NEGATIVE mg/dL
Nitrite: NEGATIVE
Protein, ur: 30 mg/dL — AB
Specific Gravity, Urine: >=1.03 (ref 1.005–1.030)
pH: 5 (ref 5.0–8.0)

## 2021-06-11 LAB — CBC WITH DIFFERENTIAL/PLATELET
Abs Immature Granulocytes: 0.04 10*3/uL (ref 0.00–0.07)
Basophils Absolute: 0 10*3/uL (ref 0.0–0.1)
Basophils Relative: 1 %
Eosinophils Absolute: 0.6 10*3/uL — ABNORMAL HIGH (ref 0.0–0.5)
Eosinophils Relative: 8 %
HCT: 32.5 % — ABNORMAL LOW (ref 36.0–46.0)
Hemoglobin: 10.5 g/dL — ABNORMAL LOW (ref 12.0–15.0)
Immature Granulocytes: 1 %
Lymphocytes Relative: 27 %
Lymphs Abs: 1.9 10*3/uL (ref 0.7–4.0)
MCH: 32 pg (ref 26.0–34.0)
MCHC: 32.3 g/dL (ref 30.0–36.0)
MCV: 99.1 fL (ref 80.0–100.0)
Monocytes Absolute: 0.4 10*3/uL (ref 0.1–1.0)
Monocytes Relative: 5 %
Neutro Abs: 4.2 10*3/uL (ref 1.7–7.7)
Neutrophils Relative %: 58 %
Platelets: 241 10*3/uL (ref 150–400)
RBC: 3.28 MIL/uL — ABNORMAL LOW (ref 3.87–5.11)
RDW: 13.7 % (ref 11.5–15.5)
WBC: 7.1 10*3/uL (ref 4.0–10.5)
nRBC: 0 % (ref 0.0–0.2)

## 2021-06-11 LAB — BASIC METABOLIC PANEL
Anion gap: 8 (ref 5–15)
BUN: 23 mg/dL (ref 8–23)
CO2: 21 mmol/L — ABNORMAL LOW (ref 22–32)
Calcium: 9.6 mg/dL (ref 8.9–10.3)
Chloride: 109 mmol/L (ref 98–111)
Creatinine, Ser: 1.21 mg/dL — ABNORMAL HIGH (ref 0.44–1.00)
GFR, Estimated: 46 mL/min — ABNORMAL LOW (ref >60)
Glucose, Bld: 131 mg/dL — ABNORMAL HIGH (ref 70–99)
Potassium: 4.3 mmol/L (ref 3.5–5.1)
Sodium: 138 mmol/L (ref 135–145)

## 2021-06-11 LAB — URINALYSIS, MICROSCOPIC (REFLEX)

## 2021-06-11 MED ORDER — SODIUM CHLORIDE 0.9 % IV BOLUS
1000.0000 mL | Freq: Once | INTRAVENOUS | Status: AC
  Administered 2021-06-11: 1000 mL via INTRAVENOUS

## 2021-06-11 MED ORDER — CEPHALEXIN 500 MG PO CAPS
500.0000 mg | ORAL_CAPSULE | Freq: Two times a day (BID) | ORAL | 0 refills | Status: AC
  Filled 2021-06-11: qty 10, 5d supply, fill #0

## 2021-06-11 NOTE — ED Notes (Signed)
Pt made very small amount of urine, sent sample to lab but will collect additional urine once pt is able after IV bolus has infused ?

## 2021-06-11 NOTE — ED Triage Notes (Signed)
Pt diagnosed with UTI 5/3, was taking macrobid but had reaction ?Switched to levothyroxine and states "I wont take that it is rat poison".   ?Reports burning with urination and stress incontinence at baseline ?Reports lower back pain over past 4-5 days  ? ?

## 2021-06-11 NOTE — ED Notes (Signed)
Correction: pt placed on Levaquin for UTI  ?

## 2021-06-11 NOTE — ED Provider Notes (Signed)
?[facility] ?Provider Note ? ? ?CSN: [id number] ?Arrival date & time: 06/11/21  1253 ? ?  ? ?History ? ?Chief Complaint  ?Patient presents with  ? Dysuria  ? ? ?Shannon Lynch is a 76 y.o. female. ? ?Patient is here with painful urination, left flank pain.  History of kidney stones.  Was diagnosed with a UTI 2 weeks ago and took [place].  But never really finished her antibiotics.  History of hypertension, COPD.  Is having fairly mild pain in the left flank but does feel like may be kidney stone to her.  Denies any nausea or vomiting.  No fevers or chills.  Nothing has made it worse or better. ? ?The history is provided by the patient.  ? ?  ? ?Home Medications ?Prior to Admission medications   ?Medication Sig Start Date End Date Taking? Authorizing Provider  ?cephALEXin (KEFLEX) 500 MG capsule Take 1 capsule (500 mg total) by mouth 2 (two) times daily for 5 days. 06/11/21 06/16/21 Yes Brigit Doke, DO  ?acetaminophen (TYLENOL) 650 MG CR tablet Take 650-1,300 mg by mouth 2 (two) times daily as needed for pain.    [provider]  ?Ascorbic Acid (VITAMIN C) 1000 MG tablet Take 1,000 mg by mouth daily.    [provider]  ?buPROPion (WELLBUTRIN XL) 300 MG 24 hr tablet Take 300 mg by mouth daily. 03/26/19   [provider]  ?carvedilol (COREG) 6.25 MG tablet Take 1 tablet (6.25 mg total) by mouth 2 (two) times daily with a meal. 02/21/14   Richardson Dopp T, PA-C  ?cholecalciferol (VITAMIN D3) 25 MCG (1000 UNIT) tablet Take 1,000 Units by mouth daily.    [provider]  ?clobetasol (TEMOVATE) 0.05 % external solution Apply 1 application topically 2 (two) times daily as needed (psoriasis).  03/10/19   [provider]  ?clorazepate (TRANXENE) 3.75 MG tablet Take 3.75 mg by mouth 2 (two) times daily.     [provider]  ?CRANBERRY PO Take 2 tablets by mouth daily.     [provider]  ?loratadine (CLARITIN) 10 MG tablet Take  10 mg by mouth daily as needed for allergies.    [provider]  ?metFORMIN (GLUCOPHAGE) 500 MG tablet Take 1,500 mg by mouth daily.     [provider]  ?omeprazole (PRILOSEC) 40 MG capsule Take 40 mg by mouth in the morning and at bedtime.    [provider]  ?polyvinyl alcohol (LIQUIFILM TEARS) 1.4 % ophthalmic solution Place 1 drop into both eyes 3 (three) times daily as needed for dry eyes.    [provider]  ?telmisartan (MICARDIS) 80 MG tablet Take 80 mg by mouth daily.    [provider]  ?triamcinolone cream (KENALOG) 0.1 % Apply 1 application topically daily as needed (psoriasis).  02/05/19   [provider]  ?zinc gluconate 50 MG tablet Take 50 mg by mouth daily.    [provider]  ?   ? ?Allergies    ?Ciprofloxacin, Codeine, Macrobid [nitrofurantoin], and Prednisone   ? ?Review of Systems   ?Review of Systems ? ?Physical Exam ?Updated Vital Signs ?BP (!) 109/58 (BP Location: Left Arm)   Pulse 74   Temp 98 ?F (36.7 ?C) (Oral)   Resp 20   Ht '5\' 3"'$  (1.6 m)   Wt 85.6 kg   LMP 06/23/1995   SpO2 97%   BMI 33.43 kg/m?  ?Physical Exam ?Vitals and nursing note reviewed.  ?Constitutional:   ?  General: She is not in acute distress. ?   Appearance: She is well-developed. She is not ill-appearing.  ?HENT:  ?   Head: Normocephalic and atraumatic.  ?   Nose: Nose normal.  ?   Mouth/Throat:  ?   Mouth: Mucous membranes are moist.  ?Eyes:  ?   Extraocular Movements: Extraocular movements intact.  ?   Conjunctiva/sclera: Conjunctivae normal.  ?   Pupils: Pupils are equal, round, and reactive to light.  ?Cardiovascular:  ?   Rate and Rhythm: Normal rate and regular rhythm.  ?   Pulses: Normal pulses.  ?   Heart sounds: Normal heart sounds. No murmur heard. ?Pulmonary:  ?   Effort: Pulmonary effort is normal. No respiratory distress.  ?   Breath sounds: Normal breath sounds.  ?Abdominal:  ?   Palpations: Abdomen is soft.  ?   Tenderness: There is no  abdominal tenderness. There is left CVA tenderness.  ?Musculoskeletal:     ?   General: No swelling.  ?   Cervical back: Neck supple.  ?Skin: ?   General: Skin is warm and dry.  ?   Capillary Refill: Capillary refill takes less than 2 seconds.  ?Neurological:  ?   Mental Status: She is alert.  ?Psychiatric:     ?   Mood and Affect: Mood normal.  ? ? ?ED Results / Procedures / Treatments   ?Labs ?(all labs ordered are listed, but only abnormal results are displayed) ?Labs Reviewed  ?CBC WITH DIFFERENTIAL/PLATELET - Abnormal; Notable for the following components:  ?    Result Value  ? RBC 3.28 (*)   ? Hemoglobin 10.5 (*)   ? HCT 32.5 (*)   ? Eosinophils Absolute 0.6 (*)   ? All other components within normal limits  ?BASIC METABOLIC PANEL - Abnormal; Notable for the following components:  ? CO2 21 (*)   ? Glucose, Bld 131 (*)   ? Creatinine, Ser 1.21 (*)   ? GFR, Estimated 46 (*)   ? All other components within normal limits  ?URINALYSIS, ROUTINE W REFLEX MICROSCOPIC - Abnormal; Notable for the following components:  ? Glucose, UA 100 (*)   ? Bilirubin Urine SMALL (*)   ? Protein, ur 30 (*)   ? Leukocytes,Ua SMALL (*)   ? All other components within normal limits  ?URINALYSIS, MICROSCOPIC (REFLEX) - Abnormal; Notable for the following components:  ? Bacteria, UA MANY (*)   ? All other components within normal limits  ?URINE CULTURE  ? ? ?EKG ?None ? ?Radiology ?CT Renal Stone Study ? ?Result Date: 06/11/2021 ?CLINICAL DATA:  Flank pain, kidney stone suspected. EXAM: CT ABDOMEN AND PELVIS WITHOUT CONTRAST TECHNIQUE: Multidetector CT imaging of the abdomen and pelvis was performed following the standard protocol without IV contrast. RADIATION DOSE REDUCTION: This exam was performed according to the departmental dose-optimization program which includes automated exposure control, adjustment of the mA and/or kV according to patient size and/or use of iterative reconstruction technique. COMPARISON:  CT September 04, 2020  FINDINGS: Lower chest: Bibasilar atelectasis versus scarring. Hepatobiliary: Similar nodular hepatic contour consistent with cirrhosis. Gallbladder is unremarkable. No biliary ductal dilation. Pancreas: No pancreatic ductal dilation or evidence of acute inflammation. Spleen: No splenomegaly. Adrenals/Urinary Tract: Bilateral adrenal glands appear normal. No hydronephrosis. Nonobstructive left renal calculi measure up to 3 mm. Calcifications along the course of the distal left ureter on image 64/2 was present on prior imaging most consistent with a phlebolith. There are additional pelvic calcifications the majority  of which were present on prior imaging and are consistent with pelvic phleboliths. No obstructive ureteral or bladder calculus identified. Urinary bladder is nondistended limiting evaluation. Stomach/Bowel: Stomach is unremarkable for degree of distension. No pathologic dilation of small or large bowel. The appendix and terminal ileum appear normal. No evidence of acute bowel inflammation. Vascular/Lymphatic: Aortic and branch vessel atherosclerosis without abdominal aortic aneurysm. Enlarged/prominent upper abdominal lymph nodes for instance a portacaval lymph node measuring 12 mm in short axis on image 25/2 are stable from prior and most likely reactive related to hepatic cirrhosis. Reproductive: Uterus and bilateral adnexa are unremarkable. Other: No significant abdominopelvic free fluid. Musculoskeletal: Advanced multilevel degenerative changes spine. IMPRESSION: 1. Nonobstructive left renal calculi measure up to 3 mm. No obstructive ureteral or bladder calculus identified. 2. Cirrhotic hepatic morphology. 3. Enlarged/prominent upper abdominal lymph nodes are stable from prior and most likely reactive related to hepatic cirrhosis. 4.  Aortic Atherosclerosis (ICD10-I70.0). Electronically Signed   By: Dahlia Bailiff M.D.   On: 06/11/2021 13:42   ? ?Procedures ?Procedures  ? ? ?Medications Ordered in  ED ?Medications  ?sodium chloride 0.9 % bolus 1,000 mL (1,000 mLs Intravenous New Bag/Given 06/11/21 1346)  ? ? ?ED Course/ Medical Decision Making/ A&P ?  ?                        ?Medical Decision Making ?Amount and/

## 2021-06-11 NOTE — ED Notes (Signed)
Patient transported to CT 

## 2021-06-13 LAB — URINE CULTURE: Culture: >=100000 — AB

## 2021-06-14 ENCOUNTER — Telehealth (HOSPITAL_BASED_OUTPATIENT_CLINIC_OR_DEPARTMENT_OTHER): Payer: Self-pay | Admitting: *Deleted

## 2021-06-14 NOTE — Telephone Encounter (Signed)
Post ED Visit - Positive Culture Follow-up  Culture report reviewed by antimicrobial stewardship pharmacist: [facility] Team '[]'$  Elenor Quinones, Pharm.D. '[]'$  Heide Guile, Pharm.D., BCPS AQ-ID '[]'$  Parks Neptune, Pharm.D., BCPS '[]'$  Alycia Rossetti, Pharm.D., BCPS '[]'$  [place], Pharm.D., BCPS, AAHIVP '[]'$  Legrand Como, Pharm.D., BCPS, AAHIVP '[]'$  Salome Arnt, PharmD, BCPS '[]'$  Johnnette Gourd, PharmD, BCPS '[]'$  Hughes Better, PharmD, BCPS '[]'$  Leeroy Cha, PharmD '[]'$  Laqueta Linden, PharmD, BCPS '[x]'$ Lorelei Pont, PharmD  [place] Team '[]'$  Leodis Sias, PharmD '[]'$  Lindell Spar, PharmD '[]'$  Royetta Asal, PharmD '[]'$  Graylin Shiver, Rph '[]'$  Rema Fendt) Glennon Mac, PharmD '[]'$  Arlyn Dunning, PharmD '[]'$  Netta Cedars, PharmD '[]'$  Dia Sitter, PharmD '[]'$  Leone Haven, PharmD '[]'$  Gretta Arab, PharmD '[]'$  Theodis Shove, PharmD '[]'$  Peggyann Juba, PharmD '[]'$  Reuel Boom, PharmD   Positive urine culture Treated with Cephalexin, organism sensitive to the same and no further patient follow-up is required at this time.  Rosie Fate 06/14/2021, 1:04 PM

## 2021-06-27 DIAGNOSIS — R2681 Unsteadiness on feet: Secondary | ICD-10-CM | POA: Diagnosis not present

## 2021-06-27 DIAGNOSIS — M6281 Muscle weakness (generalized): Secondary | ICD-10-CM | POA: Diagnosis not present

## 2021-06-27 DIAGNOSIS — R2689 Other abnormalities of gait and mobility: Secondary | ICD-10-CM | POA: Diagnosis not present

## 2021-06-27 DIAGNOSIS — Z9181 History of falling: Secondary | ICD-10-CM | POA: Diagnosis not present

## 2021-06-30 DIAGNOSIS — Z9181 History of falling: Secondary | ICD-10-CM | POA: Diagnosis not present

## 2021-06-30 DIAGNOSIS — M6281 Muscle weakness (generalized): Secondary | ICD-10-CM | POA: Diagnosis not present

## 2021-06-30 DIAGNOSIS — R2689 Other abnormalities of gait and mobility: Secondary | ICD-10-CM | POA: Diagnosis not present

## 2021-06-30 DIAGNOSIS — R2681 Unsteadiness on feet: Secondary | ICD-10-CM | POA: Diagnosis not present

## 2021-07-04 DIAGNOSIS — Z9181 History of falling: Secondary | ICD-10-CM | POA: Diagnosis not present

## 2021-07-04 DIAGNOSIS — R2681 Unsteadiness on feet: Secondary | ICD-10-CM | POA: Diagnosis not present

## 2021-07-04 DIAGNOSIS — R2689 Other abnormalities of gait and mobility: Secondary | ICD-10-CM | POA: Diagnosis not present

## 2021-07-04 DIAGNOSIS — M6281 Muscle weakness (generalized): Secondary | ICD-10-CM | POA: Diagnosis not present

## 2021-07-08 DIAGNOSIS — R2689 Other abnormalities of gait and mobility: Secondary | ICD-10-CM | POA: Diagnosis not present

## 2021-07-08 DIAGNOSIS — M6281 Muscle weakness (generalized): Secondary | ICD-10-CM | POA: Diagnosis not present

## 2021-07-08 DIAGNOSIS — Z9181 History of falling: Secondary | ICD-10-CM | POA: Diagnosis not present

## 2021-07-08 DIAGNOSIS — R2681 Unsteadiness on feet: Secondary | ICD-10-CM | POA: Diagnosis not present

## 2021-07-09 DIAGNOSIS — Z87442 Personal history of urinary calculi: Secondary | ICD-10-CM | POA: Diagnosis not present

## 2021-07-09 DIAGNOSIS — B3731 Acute candidiasis of vulva and vagina: Secondary | ICD-10-CM | POA: Diagnosis not present

## 2021-07-09 DIAGNOSIS — K760 Fatty (change of) liver, not elsewhere classified: Secondary | ICD-10-CM | POA: Diagnosis not present

## 2021-07-09 DIAGNOSIS — N390 Urinary tract infection, site not specified: Secondary | ICD-10-CM | POA: Diagnosis not present

## 2021-07-09 DIAGNOSIS — M8580 Other specified disorders of bone density and structure, unspecified site: Secondary | ICD-10-CM | POA: Diagnosis not present

## 2021-07-09 DIAGNOSIS — R059 Cough, unspecified: Secondary | ICD-10-CM | POA: Diagnosis not present

## 2021-07-09 DIAGNOSIS — E1122 Type 2 diabetes mellitus with diabetic chronic kidney disease: Secondary | ICD-10-CM | POA: Diagnosis not present

## 2021-07-09 DIAGNOSIS — I10 Essential (primary) hypertension: Secondary | ICD-10-CM | POA: Diagnosis not present

## 2021-07-09 DIAGNOSIS — K7460 Unspecified cirrhosis of liver: Secondary | ICD-10-CM | POA: Diagnosis not present

## 2021-07-09 DIAGNOSIS — E785 Hyperlipidemia, unspecified: Secondary | ICD-10-CM | POA: Diagnosis not present

## 2021-08-04 DIAGNOSIS — N3020 Other chronic cystitis without hematuria: Secondary | ICD-10-CM | POA: Diagnosis not present

## 2021-08-04 DIAGNOSIS — N200 Calculus of kidney: Secondary | ICD-10-CM | POA: Diagnosis not present

## 2021-08-04 DIAGNOSIS — R8271 Bacteriuria: Secondary | ICD-10-CM | POA: Diagnosis not present

## 2021-08-05 DIAGNOSIS — Z9181 History of falling: Secondary | ICD-10-CM | POA: Diagnosis not present

## 2021-08-05 DIAGNOSIS — M6281 Muscle weakness (generalized): Secondary | ICD-10-CM | POA: Diagnosis not present

## 2021-08-05 DIAGNOSIS — R2681 Unsteadiness on feet: Secondary | ICD-10-CM | POA: Diagnosis not present

## 2021-08-05 DIAGNOSIS — R2689 Other abnormalities of gait and mobility: Secondary | ICD-10-CM | POA: Diagnosis not present

## 2021-08-08 DIAGNOSIS — R2689 Other abnormalities of gait and mobility: Secondary | ICD-10-CM | POA: Diagnosis not present

## 2021-08-08 DIAGNOSIS — M6281 Muscle weakness (generalized): Secondary | ICD-10-CM | POA: Diagnosis not present

## 2021-08-08 DIAGNOSIS — Z9181 History of falling: Secondary | ICD-10-CM | POA: Diagnosis not present

## 2021-08-08 DIAGNOSIS — R2681 Unsteadiness on feet: Secondary | ICD-10-CM | POA: Diagnosis not present

## 2021-08-15 DIAGNOSIS — Z9181 History of falling: Secondary | ICD-10-CM | POA: Diagnosis not present

## 2021-08-15 DIAGNOSIS — R2689 Other abnormalities of gait and mobility: Secondary | ICD-10-CM | POA: Diagnosis not present

## 2021-08-15 DIAGNOSIS — M6281 Muscle weakness (generalized): Secondary | ICD-10-CM | POA: Diagnosis not present

## 2021-08-15 DIAGNOSIS — R2681 Unsteadiness on feet: Secondary | ICD-10-CM | POA: Diagnosis not present

## 2021-10-06 DIAGNOSIS — E1122 Type 2 diabetes mellitus with diabetic chronic kidney disease: Secondary | ICD-10-CM | POA: Diagnosis not present

## 2021-10-06 DIAGNOSIS — Z87442 Personal history of urinary calculi: Secondary | ICD-10-CM | POA: Diagnosis not present

## 2021-10-06 DIAGNOSIS — M8580 Other specified disorders of bone density and structure, unspecified site: Secondary | ICD-10-CM | POA: Diagnosis not present

## 2021-10-06 DIAGNOSIS — I10 Essential (primary) hypertension: Secondary | ICD-10-CM | POA: Diagnosis not present

## 2021-10-06 DIAGNOSIS — K760 Fatty (change of) liver, not elsewhere classified: Secondary | ICD-10-CM | POA: Diagnosis not present

## 2021-10-06 DIAGNOSIS — B3731 Acute candidiasis of vulva and vagina: Secondary | ICD-10-CM | POA: Diagnosis not present

## 2021-10-06 DIAGNOSIS — K7460 Unspecified cirrhosis of liver: Secondary | ICD-10-CM | POA: Diagnosis not present

## 2021-10-06 DIAGNOSIS — E559 Vitamin D deficiency, unspecified: Secondary | ICD-10-CM | POA: Diagnosis not present

## 2021-10-06 DIAGNOSIS — N390 Urinary tract infection, site not specified: Secondary | ICD-10-CM | POA: Diagnosis not present

## 2021-10-06 DIAGNOSIS — R059 Cough, unspecified: Secondary | ICD-10-CM | POA: Diagnosis not present

## 2021-10-06 DIAGNOSIS — E785 Hyperlipidemia, unspecified: Secondary | ICD-10-CM | POA: Diagnosis not present

## 2021-10-13 DIAGNOSIS — M25539 Pain in unspecified wrist: Secondary | ICD-10-CM | POA: Diagnosis not present

## 2021-10-13 DIAGNOSIS — J329 Chronic sinusitis, unspecified: Secondary | ICD-10-CM | POA: Diagnosis not present

## 2021-10-13 DIAGNOSIS — M549 Dorsalgia, unspecified: Secondary | ICD-10-CM | POA: Diagnosis not present

## 2021-10-13 DIAGNOSIS — K760 Fatty (change of) liver, not elsewhere classified: Secondary | ICD-10-CM | POA: Diagnosis not present

## 2021-10-13 DIAGNOSIS — E559 Vitamin D deficiency, unspecified: Secondary | ICD-10-CM | POA: Diagnosis not present

## 2021-10-13 DIAGNOSIS — R053 Chronic cough: Secondary | ICD-10-CM | POA: Diagnosis not present

## 2021-10-13 DIAGNOSIS — M25532 Pain in left wrist: Secondary | ICD-10-CM | POA: Diagnosis not present

## 2021-10-13 DIAGNOSIS — E1122 Type 2 diabetes mellitus with diabetic chronic kidney disease: Secondary | ICD-10-CM | POA: Diagnosis not present

## 2021-10-13 DIAGNOSIS — E785 Hyperlipidemia, unspecified: Secondary | ICD-10-CM | POA: Diagnosis not present

## 2021-10-13 DIAGNOSIS — N390 Urinary tract infection, site not specified: Secondary | ICD-10-CM | POA: Diagnosis not present

## 2022-03-18 DIAGNOSIS — R300 Dysuria: Secondary | ICD-10-CM | POA: Diagnosis not present

## 2022-04-13 DIAGNOSIS — E785 Hyperlipidemia, unspecified: Secondary | ICD-10-CM | POA: Diagnosis not present

## 2022-04-13 DIAGNOSIS — E559 Vitamin D deficiency, unspecified: Secondary | ICD-10-CM | POA: Diagnosis not present

## 2022-04-13 DIAGNOSIS — K760 Fatty (change of) liver, not elsewhere classified: Secondary | ICD-10-CM | POA: Diagnosis not present

## 2022-04-13 DIAGNOSIS — E1122 Type 2 diabetes mellitus with diabetic chronic kidney disease: Secondary | ICD-10-CM | POA: Diagnosis not present

## 2022-04-13 DIAGNOSIS — F339 Major depressive disorder, recurrent, unspecified: Secondary | ICD-10-CM | POA: Diagnosis not present

## 2022-04-13 DIAGNOSIS — N390 Urinary tract infection, site not specified: Secondary | ICD-10-CM | POA: Diagnosis not present

## 2022-04-20 DIAGNOSIS — K760 Fatty (change of) liver, not elsewhere classified: Secondary | ICD-10-CM | POA: Diagnosis not present

## 2022-04-20 DIAGNOSIS — E1122 Type 2 diabetes mellitus with diabetic chronic kidney disease: Secondary | ICD-10-CM | POA: Diagnosis not present

## 2022-04-20 DIAGNOSIS — R059 Cough, unspecified: Secondary | ICD-10-CM | POA: Diagnosis not present

## 2022-04-20 DIAGNOSIS — B372 Candidiasis of skin and nail: Secondary | ICD-10-CM | POA: Diagnosis not present

## 2022-04-20 DIAGNOSIS — N1831 Chronic kidney disease, stage 3a: Secondary | ICD-10-CM | POA: Diagnosis not present

## 2022-04-20 DIAGNOSIS — Z9109 Other allergy status, other than to drugs and biological substances: Secondary | ICD-10-CM | POA: Diagnosis not present

## 2022-04-20 DIAGNOSIS — F339 Major depressive disorder, recurrent, unspecified: Secondary | ICD-10-CM | POA: Diagnosis not present

## 2022-04-20 DIAGNOSIS — E559 Vitamin D deficiency, unspecified: Secondary | ICD-10-CM | POA: Diagnosis not present

## 2022-04-20 DIAGNOSIS — E785 Hyperlipidemia, unspecified: Secondary | ICD-10-CM | POA: Diagnosis not present

## 2022-04-20 DIAGNOSIS — M159 Polyosteoarthritis, unspecified: Secondary | ICD-10-CM | POA: Diagnosis not present

## 2022-04-20 DIAGNOSIS — N390 Urinary tract infection, site not specified: Secondary | ICD-10-CM | POA: Diagnosis not present

## 2022-05-01 DIAGNOSIS — H40013 Open angle with borderline findings, low risk, bilateral: Secondary | ICD-10-CM | POA: Diagnosis not present

## 2022-05-01 DIAGNOSIS — H16223 Keratoconjunctivitis sicca, not specified as Sjogren's, bilateral: Secondary | ICD-10-CM | POA: Diagnosis not present

## 2022-05-01 DIAGNOSIS — Z961 Presence of intraocular lens: Secondary | ICD-10-CM | POA: Diagnosis not present

## 2022-05-01 DIAGNOSIS — R7303 Prediabetes: Secondary | ICD-10-CM | POA: Diagnosis not present

## 2022-08-02 ENCOUNTER — Emergency Department (HOSPITAL_BASED_OUTPATIENT_CLINIC_OR_DEPARTMENT_OTHER): Payer: No Typology Code available for payment source

## 2022-08-02 ENCOUNTER — Emergency Department (HOSPITAL_BASED_OUTPATIENT_CLINIC_OR_DEPARTMENT_OTHER)
Admission: EM | Admit: 2022-08-02 | Discharge: 2022-08-02 | Disposition: A | Discharge status: Home / Self Care | Payer: No Typology Code available for payment source | Attending: Emergency Medicine | Admitting: Emergency Medicine

## 2022-08-02 ENCOUNTER — Other Ambulatory Visit: Payer: Self-pay

## 2022-08-02 ENCOUNTER — Encounter (HOSPITAL_BASED_OUTPATIENT_CLINIC_OR_DEPARTMENT_OTHER): Payer: Self-pay

## 2022-08-02 DIAGNOSIS — D649 Anemia, unspecified: Secondary | ICD-10-CM | POA: Diagnosis not present

## 2022-08-02 DIAGNOSIS — E119 Type 2 diabetes mellitus without complications: Secondary | ICD-10-CM | POA: Diagnosis not present

## 2022-08-02 DIAGNOSIS — R600 Localized edema: Secondary | ICD-10-CM | POA: Diagnosis not present

## 2022-08-02 DIAGNOSIS — J449 Chronic obstructive pulmonary disease, unspecified: Secondary | ICD-10-CM | POA: Insufficient documentation

## 2022-08-02 DIAGNOSIS — Z7984 Long term (current) use of oral hypoglycemic drugs: Secondary | ICD-10-CM | POA: Diagnosis not present

## 2022-08-02 DIAGNOSIS — R7401 Elevation of levels of liver transaminase levels: Secondary | ICD-10-CM | POA: Insufficient documentation

## 2022-08-02 DIAGNOSIS — R918 Other nonspecific abnormal finding of lung field: Secondary | ICD-10-CM | POA: Diagnosis not present

## 2022-08-02 DIAGNOSIS — R079 Chest pain, unspecified: Secondary | ICD-10-CM | POA: Insufficient documentation

## 2022-08-02 LAB — CBC WITH DIFFERENTIAL/PLATELET
Abs Immature Granulocytes: 0.06 10*3/uL (ref 0.00–0.07)
Basophils Absolute: 0.1 10*3/uL (ref 0.0–0.1)
Basophils Relative: 1 %
Eosinophils Absolute: 0.3 10*3/uL (ref 0.0–0.5)
Eosinophils Relative: 6 %
HCT: 29.7 % — ABNORMAL LOW (ref 36.0–46.0)
Hemoglobin: 9.7 g/dL — ABNORMAL LOW (ref 12.0–15.0)
Immature Granulocytes: 1 %
Lymphocytes Relative: 24 %
Lymphs Abs: 1.4 10*3/uL (ref 0.7–4.0)
MCH: 31.5 pg (ref 26.0–34.0)
MCHC: 32.7 g/dL (ref 30.0–36.0)
MCV: 96.4 fL (ref 80.0–100.0)
Monocytes Absolute: 0.3 10*3/uL (ref 0.1–1.0)
Monocytes Relative: 5 %
Neutro Abs: 3.7 10*3/uL (ref 1.7–7.7)
Neutrophils Relative %: 63 %
Platelets: 234 10*3/uL (ref 150–400)
RBC: 3.08 MIL/uL — ABNORMAL LOW (ref 3.87–5.11)
RDW: 13.9 % (ref 11.5–15.5)
WBC: 6 10*3/uL (ref 4.0–10.5)
nRBC: 0 % (ref 0.0–0.2)

## 2022-08-02 LAB — COMPREHENSIVE METABOLIC PANEL
ALT: 23 U/L (ref 0–44)
AST: 42 U/L — ABNORMAL HIGH (ref 15–41)
Albumin: 3 g/dL — ABNORMAL LOW (ref 3.5–5.0)
Alkaline Phosphatase: 173 U/L — ABNORMAL HIGH (ref 38–126)
Anion gap: 7 (ref 5–15)
BUN: 17 mg/dL (ref 8–23)
CO2: 24 mmol/L (ref 22–32)
Calcium: 9.1 mg/dL (ref 8.9–10.3)
Chloride: 106 mmol/L (ref 98–111)
Creatinine, Ser: 0.9 mg/dL (ref 0.44–1.00)
GFR, Estimated: >60 mL/min (ref >60)
Glucose, Bld: 129 mg/dL — ABNORMAL HIGH (ref 70–99)
Potassium: 3.6 mmol/L (ref 3.5–5.1)
Sodium: 137 mmol/L (ref 135–145)
Total Bilirubin: 1.2 mg/dL (ref 0.3–1.2)
Total Protein: 7.3 g/dL (ref 6.5–8.1)

## 2022-08-02 LAB — TROPONIN I (HIGH SENSITIVITY): Troponin I (High Sensitivity): 4 ng/L (ref <18)

## 2022-08-02 LAB — BRAIN NATRIURETIC PEPTIDE: B Natriuretic Peptide: 360.1 pg/mL — ABNORMAL HIGH (ref 0.0–100.0)

## 2022-08-02 MED ORDER — POTASSIUM CHLORIDE CRYS ER 20 MEQ PO TBCR
40.0000 meq | EXTENDED_RELEASE_TABLET | Freq: Once | ORAL | Status: AC
  Administered 2022-08-02: 40 meq via ORAL
  Filled 2022-08-02: qty 2

## 2022-08-02 MED ORDER — FUROSEMIDE 10 MG/ML IJ SOLN
20.0000 mg | Freq: Once | INTRAMUSCULAR | Status: AC
  Administered 2022-08-02: 20 mg via INTRAVENOUS
  Filled 2022-08-02: qty 2

## 2022-08-02 NOTE — Discharge Instructions (Addendum)
You were seen in the ER today for evaluation of your chest pain and lower extremity edema. Your workup shows some signs of heart failure. I would like for you to see a cardiologist for this. I have placed a referral for one in the discharge paperwork. Please make sure you call to schedule an appointment. In the meantime, keep wearing your compression socks and elevating your feet. I have included more information on chest pain and edema into the dashboard.  Please review. If you have any concerns, new or worsening symptoms, please return to the ER for re-evaluation.   **Your XR showed a pulmonary nodule. Please follow up with your PCP about your imaging results.   Contact a doctor if: Your chest pain does not go away. You feel depressed. You have a fever. Get help right away if: Your chest pain is worse. You have a cough that gets worse, or you cough up blood. You have very bad (severe) pain in your belly (abdomen). You pass out (faint). You have either of these for no clear reason: Sudden chest discomfort. Sudden discomfort in your arms, back, neck, or jaw. You have shortness of breath at any time. You suddenly start to sweat, or your skin gets clammy. You feel sick to your stomach (nauseous). You throw up (vomit). You suddenly feel lightheaded or dizzy. You feel very weak or tired. Your heart starts to beat fast, or it feels like it is skipping beats. These symptoms may be an emergency. Do not wait to see if the symptoms will go away. Get medical help right away. Call your local emergency services (911 in the [place] Do not drive yourself to the hospital.

## 2022-08-02 NOTE — ED Provider Notes (Signed)
[place] [facility] Provider Note   CSN: [id number] Arrival date & time: 08/02/22  1003     History Chief Complaint  Patient presents with   Chest Pain    Shannon Lynch is a 77 y.o. female with history of anxiety, COPD, type 2 diabetes, hypokalemia presents emerged part today for evaluation of chest pain off and on for the past 5 days.  Pain is at random and is not exertional.  Is not worse with movement or with deep breaths.  She denies any nausea, vomiting, abdominal pain, shortness of breath, lightheadedness, syncope, fever, chills.  She does endorse some occasional palpitations.  Reports that she has been having some swelling in her feet for the past week.  She reports that this is typical for her however during the hot summer months.  Denies any new medication uses.  Denies any trauma to the area or any recent falls.  She reports that she has had a cough for the past few months that has already been seen by her primary care doctor and is significantly improved.   Chest Pain Associated symptoms: cough (improved) and palpitations   Associated symptoms: no abdominal pain, no dizziness, no fever, no headache, no nausea, no shortness of breath and no vomiting        Home Medications Prior to Admission medications   Medication Sig Start Date End Date Taking? Authorizing Provider  acetaminophen (TYLENOL) 650 MG CR tablet Take 650-1,300 mg by mouth 2 (two) times daily as needed for pain.    [provider]  Ascorbic Acid (VITAMIN C) 1000 MG tablet Take 1,000 mg by mouth daily.    [provider]  buPROPion (WELLBUTRIN XL) 300 MG 24 hr tablet Take 300 mg by mouth daily. 03/26/19   [provider]  carvedilol (COREG) 6.25 MG tablet Take 1 tablet (6.25 mg total) by mouth 2 (two) times daily with a meal. 02/21/14   Weaver, Lorin Picket T, PA-C  cholecalciferol (VITAMIN D3) 25 MCG (1000 UNIT) tablet Take 1,000 Units by mouth daily.     [provider]  clobetasol (TEMOVATE) 0.05 % external solution Apply 1 application topically 2 (two) times daily as needed (psoriasis).  03/10/19   [provider]  clorazepate (TRANXENE) 3.75 MG tablet Take 3.75 mg by mouth 2 (two) times daily.     [provider]  CRANBERRY PO Take 2 tablets by mouth daily.     [provider]  loratadine (CLARITIN) 10 MG tablet Take 10 mg by mouth daily as needed for allergies.    [provider]  metFORMIN (GLUCOPHAGE) 500 MG tablet Take 1,500 mg by mouth daily.     [provider]  omeprazole (PRILOSEC) 40 MG capsule Take 40 mg by mouth in the morning and at bedtime.    [provider]  polyvinyl alcohol (LIQUIFILM TEARS) 1.4 % ophthalmic solution Place 1 drop into both eyes 3 (three) times daily as needed for dry eyes.    [provider]  telmisartan (MICARDIS) 80 MG tablet Take 80 mg by mouth daily.    [provider]  triamcinolone cream (KENALOG) 0.1 % Apply 1 application topically daily as needed (psoriasis).  02/05/19   [provider]  zinc gluconate 50 MG tablet Take 50 mg by mouth daily.    [provider]      Allergies    Ciprofloxacin, Codeine, Macrobid [nitrofurantoin], and Prednisone    Review of Systems  Review of Systems  Constitutional:  Negative for chills and fever.  HENT:  Negative for congestion and rhinorrhea.   Respiratory:  Positive for cough (improved). Negative for shortness of breath.   Cardiovascular:  Positive for chest pain, palpitations and leg swelling.  Gastrointestinal:  Negative for abdominal pain, constipation, diarrhea, nausea and vomiting.  Genitourinary:  Negative for dysuria and hematuria.  Neurological:  Negative for dizziness, syncope, light-headedness and headaches.    Physical Exam Updated Vital Signs BP (!) 136/55   Pulse (!) 55   Temp 98.1 F (36.7 C) (Oral)   Resp (!) 22   Ht 5\' 3"  (1.6 m)   Wt  70.3 kg   LMP 06/23/1995   SpO2 96%   BMI 27.46 kg/m  Physical Exam Vitals and nursing note reviewed.  Constitutional:      General: She is not in acute distress.    Appearance: She is not ill-appearing or toxic-appearing.  Cardiovascular:     Rate and Rhythm: Normal rate.     Pulses:          Radial pulses are 2+ on the right side and 2+ on the left side.       Dorsalis pedis pulses are 2+ on the right side and 2+ on the left side.       Posterior tibial pulses are 2+ on the right side and 2+ on the left side.  Pulmonary:     Effort: Pulmonary effort is normal.     Breath sounds: Normal breath sounds. No decreased breath sounds, wheezing or rhonchi.  Chest:     Chest wall: No tenderness.     Comments: No tenderness to palpation.  No overlying skin changes noted. Abdominal:     Palpations: Abdomen is soft.     Tenderness: There is no abdominal tenderness. There is no guarding or rebound.  Musculoskeletal:     Right lower leg: Edema present.     Left lower leg: Edema present.     Comments: 1+ pitting edema to the bilateral lower extremities, to the mid calf.  Palpable pulses.  Compartments are still pliable.  Skin:    General: Skin is warm and dry.     Capillary Refill: Capillary refill takes less than 2 seconds.  Neurological:     General: No focal deficit present.     Mental Status: She is alert.     Motor: No weakness.     ED Results / Procedures / Treatments   Labs (all labs ordered are listed, but only abnormal results are displayed) Labs Reviewed  CBC WITH DIFFERENTIAL/PLATELET - Abnormal; Notable for the following components:      Result Value   RBC 3.08 (*)    Hemoglobin 9.7 (*)    HCT 29.7 (*)    All other components within normal limits  COMPREHENSIVE METABOLIC PANEL - Abnormal; Notable for the following components:   Glucose, Bld 129 (*)    Albumin 3.0 (*)    AST 42 (*)    Alkaline Phosphatase 173 (*)    All other components within normal limits  BRAIN  NATRIURETIC PEPTIDE - Abnormal; Notable for the following components:   B Natriuretic Peptide 360.1 (*)    All other components within normal limits  TROPONIN I (HIGH SENSITIVITY)  TROPONIN I (HIGH SENSITIVITY)    EKG EKG Interpretation Date/Time:  Sunday August 02 2022 10:13:46 EDT Ventricular Rate:  74 PR Interval:  210 QRS Duration:  85 QT Interval:  301 QTC Calculation:  334 R Axis:   25  Text Interpretation: Sinus rhythm Low voltage, precordial leads Nonspecific T abnormalities, diffuse leads Short QT interval Confirmed by Edwin Dada ([id number]) on 08/02/2022 11:51:04 AM  Radiology DG Chest 2 View  Result Date: 08/02/2022 CLINICAL DATA:  Chest pain intermittently for 5 days. EXAM: CHEST - 2 VIEW COMPARISON:  Chest radiograph 09/05/2018 FINDINGS: The cardiomediastinal silhouette is normal There is no focal consolidation or pulmonary edema. There is no pleural effusion or pneumothorax. There is a 1.0 cm nodular opacity projecting over the right upper lobe just medial to the telemetry lead. There is no acute osseous abnormality. IMPRESSION: 1. 1.0 cm nodular opacity projecting over the right upper lobe may be artifactual. Recommend either CT chest or repeat radiographs after removing all overlying leads/wires. 2. Otherwise, no radiographic evidence of acute cardiopulmonary process. Electronically Signed   By: Lesia Hausen M.D.   On: 08/02/2022 11:18    Procedures Procedures   Medications Ordered in ED Medications  furosemide (LASIX) injection 20 mg (20 mg Intravenous Given 08/02/22 1204)  potassium chloride SA (KLOR-CON M) CR tablet 40 mEq (40 mEq Oral Given 08/02/22 1204)    ED Course/ Medical Decision Making/ A&P                            Medical Decision Making Amount and/or Complexity of Data Reviewed Labs: ordered. Radiology: ordered.  Risk Prescription drug management.   77 y.o. female presents to the ER for evaluation of chest pain. Differential diagnosis includes but is not  limited to ACS, pericarditis, myocarditis, aortic dissection, PE, pneumothorax, esophageal spasm or rupture, chronic angina, pneumonia, bronchitis, GERD, reflux/PUD, biliary disease, pancreatitis, costochondritis, anxiety. Vital signs mildly elevated blood pressure 141/59 otherwise unremarkable. Physical exam as noted above.   I independently reviewed and interpreted the patient's labs.  CBC shows slightly worsening anemia with a hemoglobin of 9.7.  No cytosis.  CMP shows glucose at 129.  Mildly decreased albumin at 3.0.  AST and alk phos slightly elevated as well.  Normal total bilirubin.  Peers that it has been elevated in the past as well.  Troponin at 4.  BNP is elevated at 360.  Chest x-ray shows 1. 1.0 cm nodular opacity projecting over the right upper lobe may be artifactual. Recommend either CT chest or repeat radiographs after removing all overlying leads/wires. 2. Otherwise, no radiographic evidence of acute cardiopulmonary process. Per radiology reads.   EKG reviewed interpreted my attending and read as Sinus rhythm Low voltage, precordial leads Nonspecific T abnormalities, diffuse leads Short QT interval.  The patient was given a small dose of IV Lasix here and some oral potassium given her potassium was borderline low.   She is not hypoxic or having any shortness or breath. Her lungs do not show any pleural effusions.   I instructed her to follow-up with her primary care doctor about her fluid on her legs as well as the chest x-ray findings.  I am hesitant to start her on any daily fluid pill given her history of low potassium and her borderline low potassium here.  Will visit her primary care doctor.  I did place an amatory referral for cardiology given that with her last visit was several years ago.  Chest pain is not exertional.  She is not having any shortness of breath or dyspnea.  No pleuritic chest pain.  I will lower suspicion for any PE.  Does not sound consistent with  any  dissection or aneurysm.  She is overall well appearing.  We discussed the results of the labs/imaging. The plan is follow-up with your memory care doctor, follow-up with PCP, elevate your legs. We discussed strict return precautions and red flag symptoms. The patient verbalized their understanding and agrees to the plan. The patient is stable and being discharged home in good condition.  Portions of this report may have been transcribed using voice recognition software. Every effort was made to ensure accuracy; however, inadvertent computerized transcription errors may be present.   I discussed this case with my attending physician who cosigned this note including patient's presenting symptoms, physical exam, and planned diagnostics and interventions. Attending physician stated agreement with plan or made changes to plan which were implemented.   Final Clinical Impression(s) / ED Diagnoses Final diagnoses:  Chest pain, unspecified type  Bilateral lower extremity edema    Rx / DC Orders ED Discharge Orders          Ordered    Ambulatory referral to Cardiology  Status:  Canceled        08/02/22 1207    Ambulatory referral to Cardiology        08/02/22 1232              Achille Rich, PA-C 08/02/22 1233    Franne Forts, DO 08/08/22 [phone]

## 2022-08-02 NOTE — ED Triage Notes (Signed)
Patient stated she has had chest pain on and off since Wednesday. She has also had a new on set swelling in her feet.

## 2022-08-05 DIAGNOSIS — E785 Hyperlipidemia, unspecified: Secondary | ICD-10-CM | POA: Diagnosis not present

## 2022-08-05 DIAGNOSIS — E1122 Type 2 diabetes mellitus with diabetic chronic kidney disease: Secondary | ICD-10-CM | POA: Diagnosis not present

## 2022-08-05 DIAGNOSIS — N1831 Chronic kidney disease, stage 3a: Secondary | ICD-10-CM | POA: Diagnosis not present

## 2022-08-05 DIAGNOSIS — K760 Fatty (change of) liver, not elsewhere classified: Secondary | ICD-10-CM | POA: Diagnosis not present

## 2022-08-05 DIAGNOSIS — N390 Urinary tract infection, site not specified: Secondary | ICD-10-CM | POA: Diagnosis not present

## 2022-08-05 DIAGNOSIS — E559 Vitamin D deficiency, unspecified: Secondary | ICD-10-CM | POA: Diagnosis not present

## 2022-08-10 DIAGNOSIS — Z9109 Other allergy status, other than to drugs and biological substances: Secondary | ICD-10-CM | POA: Diagnosis not present

## 2022-08-10 DIAGNOSIS — M159 Polyosteoarthritis, unspecified: Secondary | ICD-10-CM | POA: Diagnosis not present

## 2022-08-10 DIAGNOSIS — E1122 Type 2 diabetes mellitus with diabetic chronic kidney disease: Secondary | ICD-10-CM | POA: Diagnosis not present

## 2022-08-10 DIAGNOSIS — K760 Fatty (change of) liver, not elsewhere classified: Secondary | ICD-10-CM | POA: Diagnosis not present

## 2022-08-10 DIAGNOSIS — I10 Essential (primary) hypertension: Secondary | ICD-10-CM | POA: Diagnosis not present

## 2022-08-10 DIAGNOSIS — Z0000 Encounter for general adult medical examination without abnormal findings: Secondary | ICD-10-CM | POA: Diagnosis not present

## 2022-08-10 DIAGNOSIS — R634 Abnormal weight loss: Secondary | ICD-10-CM | POA: Diagnosis not present

## 2022-08-10 DIAGNOSIS — D649 Anemia, unspecified: Secondary | ICD-10-CM | POA: Diagnosis not present

## 2022-08-10 DIAGNOSIS — N390 Urinary tract infection, site not specified: Secondary | ICD-10-CM | POA: Diagnosis not present

## 2022-08-10 DIAGNOSIS — E538 Deficiency of other specified B group vitamins: Secondary | ICD-10-CM | POA: Diagnosis not present

## 2022-08-10 DIAGNOSIS — N1831 Chronic kidney disease, stage 3a: Secondary | ICD-10-CM | POA: Diagnosis not present

## 2022-08-11 ENCOUNTER — Other Ambulatory Visit: Payer: Self-pay | Admitting: Family Medicine

## 2022-08-11 DIAGNOSIS — R059 Cough, unspecified: Secondary | ICD-10-CM

## 2022-08-11 DIAGNOSIS — R634 Abnormal weight loss: Secondary | ICD-10-CM

## 2022-08-11 DIAGNOSIS — R9389 Abnormal findings on diagnostic imaging of other specified body structures: Secondary | ICD-10-CM

## 2022-08-11 DIAGNOSIS — R748 Abnormal levels of other serum enzymes: Secondary | ICD-10-CM

## 2022-08-11 DIAGNOSIS — D649 Anemia, unspecified: Secondary | ICD-10-CM

## 2022-08-31 ENCOUNTER — Ambulatory Visit
Admission: RE | Admit: 2022-08-31 | Discharge: 2022-08-31 | Disposition: A | Discharge status: Home / Self Care | Payer: No Typology Code available for payment source | Source: Ambulatory Visit | Attending: Family Medicine | Admitting: Family Medicine

## 2022-08-31 DIAGNOSIS — R911 Solitary pulmonary nodule: Secondary | ICD-10-CM | POA: Diagnosis not present

## 2022-08-31 DIAGNOSIS — R748 Abnormal levels of other serum enzymes: Secondary | ICD-10-CM

## 2022-08-31 DIAGNOSIS — D649 Anemia, unspecified: Secondary | ICD-10-CM

## 2022-08-31 DIAGNOSIS — R634 Abnormal weight loss: Secondary | ICD-10-CM

## 2022-08-31 DIAGNOSIS — R059 Cough, unspecified: Secondary | ICD-10-CM

## 2022-08-31 DIAGNOSIS — R9389 Abnormal findings on diagnostic imaging of other specified body structures: Secondary | ICD-10-CM

## 2022-08-31 DIAGNOSIS — J479 Bronchiectasis, uncomplicated: Secondary | ICD-10-CM | POA: Diagnosis not present

## 2022-08-31 DIAGNOSIS — I2510 Atherosclerotic heart disease of native coronary artery without angina pectoris: Secondary | ICD-10-CM | POA: Diagnosis not present

## 2022-09-16 DIAGNOSIS — N1831 Chronic kidney disease, stage 3a: Secondary | ICD-10-CM | POA: Diagnosis not present

## 2022-09-16 DIAGNOSIS — D649 Anemia, unspecified: Secondary | ICD-10-CM | POA: Diagnosis not present

## 2022-09-16 DIAGNOSIS — R300 Dysuria: Secondary | ICD-10-CM | POA: Diagnosis not present

## 2022-09-16 DIAGNOSIS — E538 Deficiency of other specified B group vitamins: Secondary | ICD-10-CM | POA: Diagnosis not present

## 2022-09-21 DIAGNOSIS — J849 Interstitial pulmonary disease, unspecified: Secondary | ICD-10-CM | POA: Diagnosis not present

## 2022-09-21 DIAGNOSIS — R946 Abnormal results of thyroid function studies: Secondary | ICD-10-CM | POA: Diagnosis not present

## 2022-09-21 DIAGNOSIS — M5417 Radiculopathy, lumbosacral region: Secondary | ICD-10-CM | POA: Diagnosis not present

## 2022-09-21 DIAGNOSIS — M159 Polyosteoarthritis, unspecified: Secondary | ICD-10-CM | POA: Diagnosis not present

## 2022-09-21 DIAGNOSIS — D649 Anemia, unspecified: Secondary | ICD-10-CM | POA: Diagnosis not present

## 2022-09-21 DIAGNOSIS — N390 Urinary tract infection, site not specified: Secondary | ICD-10-CM | POA: Diagnosis not present

## 2022-09-21 DIAGNOSIS — K7460 Unspecified cirrhosis of liver: Secondary | ICD-10-CM | POA: Diagnosis not present

## 2022-09-21 DIAGNOSIS — R053 Chronic cough: Secondary | ICD-10-CM | POA: Diagnosis not present

## 2022-10-01 ENCOUNTER — Encounter: Payer: Self-pay | Admitting: Cardiology

## 2022-10-01 NOTE — Progress Notes (Signed)
Cardiology Office Note   Date:  10/02/2022   ID:  Shannon Lynch, DOB 1945/08/04, MRN [id number]  PCP:  Irena Reichmann, DO  Cardiologist:   Rollene Rotunda, MD Referring:  Irena Reichmann, DO  Chief Complaint  Patient presents with   Coronary Artery Disease      History of Present Illness: Shannon Lynch is a 77 y.o. female who presents for evaluation of chest pain.  This happened in July.  She actually went to the emergency room because of this.  I did review these records.  Interestingly she had a BNP level that was 360.  She was given 1 dose of Lasix.  She had some feet swelling but she said she was not particularly short of breath.  Her chest x-ray did have a questionable spot when she got a CT in August of her chest.  I was able to review this.  She has some coronary calcium and aortic atherosclerosis but there were no other acute findings.  She has some bronchiectasis. She said that since that visit she has not had any further chest discomfort.  She is not describing any PND or orthopnea.  She does not really have any shortness of breath.  She does not have any palpitations, presyncope or syncope.  She does her groceries and walks 25 yards to her mailbox.  There is a slight incline.  She does not have any acute symptoms with this.  She is otherwise not had any prior cardiac history.    She had a perfusion study in 2016 for evaluation of chest pain.  This was negative.  I also see an echocardiogram with moderate LVH done in 2015.  There were no significant valvular abnormalities.   Past Medical History:  Diagnosis Date   Anxiety    Arthritis    Chronic UTI (urinary tract infection)    COPD (chronic obstructive pulmonary disease) (HCC)    mild   Depression    Diabetes mellitus without complication (HCC)    Exogenous obesity    History of kidney stones    Hx of cardiovascular stress test    a. Nuclear Stress Test (02/14/14):  No ischemia, EF 63%, Normal Study   Hx of  echocardiogram    a. Echocardiogram (01/22/14):  Mod LVH, EF 55-60%, no RWMA, Gr 1 DD, mild to mod MAC   Hypertension    Osteonecrosis (HCC)     Past Surgical History:  Procedure Laterality Date   CATARACT EXTRACTION     CYSTOSCOPY W/ URETERAL STENT PLACEMENT Left 09/05/2018   Procedure: CYSTOSCOPY WITH URETERAL STENT PLACEMENT;  Surgeon: Bjorn Pippin, MD;  Location: [facility];  Service: Urology;  Laterality: Left;   CYSTOSCOPY/URETEROSCOPY/HOLMIUM LASER/STENT PLACEMENT Left 04/11/2019   Procedure: CYSTOSCOPY LEFT RETROGRADE /URETEROSCOPY/HOLMIUM LASER/STENT PLACEMENT;  Surgeon: Bjorn Pippin, MD;  Location: [facility];  Service: Urology;  Laterality: Left;   EXTRACORPOREAL SHOCK WAVE LITHOTRIPSY Left 09/19/2018   Procedure: EXTRACORPOREAL SHOCK WAVE LITHOTRIPSY (ESWL);  Surgeon: Rene Paci, MD;  Location: [facility];  Service: Urology;  Laterality: Left;   FINGER SURGERY     related to tumors on finger removal    TOTAL KNEE ARTHROPLASTY     TOTAL KNEE ARTHROPLASTY Left 09/26/2013   Procedure: LEFT TOTAL KNEE ARTHROPLASTY;  Surgeon: Shelda Pal, MD;  Location: [facility];  Service: Orthopedics;  Laterality: Left;   TUBAL LIGATION       Current Outpatient Medications  Medication Sig Dispense Refill   acetaminophen (TYLENOL) 650  MG CR tablet Take 650-1,300 mg by mouth 2 (two) times daily as needed for pain.     buPROPion (WELLBUTRIN XL) 300 MG 24 hr tablet Take 300 mg by mouth daily.     carvedilol (COREG) 6.25 MG tablet Take 1 tablet (6.25 mg total) by mouth 2 (two) times daily with a meal. 60 tablet 0   clobetasol (TEMOVATE) 0.05 % external solution Apply 1 application topically 2 (two) times daily as needed (psoriasis).      clorazepate (TRANXENE) 3.75 MG tablet Take 3.75 mg by mouth 2 (two) times daily.      CRANBERRY PO Take 2 tablets by mouth daily.      omeprazole (PRILOSEC) 40 MG capsule Take 40 mg by mouth in the morning and at bedtime.     triamcinolone cream (KENALOG) 0.1 %  Apply 1 application topically daily as needed (psoriasis).      Ascorbic Acid (VITAMIN C) 1000 MG tablet Take 1,000 mg by mouth daily. (Patient not taking: Reported on 10/02/2022)     cholecalciferol (VITAMIN D3) 25 MCG (1000 UNIT) tablet Take 1,000 Units by mouth daily. (Patient not taking: Reported on 10/02/2022)     loratadine (CLARITIN) 10 MG tablet Take 10 mg by mouth daily as needed for allergies. (Patient not taking: Reported on 10/02/2022)     metFORMIN (GLUCOPHAGE) 500 MG tablet Take 1,500 mg by mouth daily.  (Patient not taking: Reported on 10/02/2022)     polyvinyl alcohol (LIQUIFILM TEARS) 1.4 % ophthalmic solution Place 1 drop into both eyes 3 (three) times daily as needed for dry eyes. (Patient not taking: Reported on 10/02/2022)     telmisartan (MICARDIS) 80 MG tablet Take 80 mg by mouth daily. (Patient not taking: Reported on 10/02/2022)     zinc gluconate 50 MG tablet Take 50 mg by mouth daily. (Patient not taking: Reported on 10/02/2022)     No current facility-administered medications for this visit.    Allergies:   Ciprofloxacin, Codeine, Macrobid [nitrofurantoin], and Prednisone    Social History:  The patient  reports that she quit smoking about 12 years ago. Her smoking use included cigarettes. She has never used smokeless tobacco. She reports that she does not drink alcohol and does not use drugs.   Family History:  The patient's family history includes Alzheimer's disease in her mother; Emphysema in her father; Heart attack in her paternal aunt.    ROS:  Please see the history of present illness.   Otherwise, review of systems are positive for none.   All other systems are reviewed and negative.    PHYSICAL EXAM: VS:  BP (!) 146/80 (BP Location: Left Arm, Patient Position: Sitting, Cuff Size: Normal)   Pulse 68   Ht 5\' 2"  (1.575 m)   Wt 156 lb (70.8 kg)   LMP 06/23/1995   SpO2 96%   BMI 28.53 kg/m  , BMI Body mass index is 28.53 kg/m. GENERAL:  Well appearing HEENT:   Pupils equal round and reactive, fundi not visualized, oral mucosa unremarkable NECK:  No jugular venous distention, waveform within normal limits, carotid upstroke brisk and symmetric, no bruits, no thyromegaly LYMPHATICS:  No cervical, inguinal adenopathy LUNGS:  Clear to auscultation bilaterally BACK:  No CVA tenderness CHEST:  Unremarkable HEART:  PMI not displaced or sustained,S1 and S2 within normal limits, no S3, no S4, no clicks, no rubs, very soft brief apical systolic murmur nonradiating, no diastolic murmurs ABD:  Flat, positive bowel sounds normal in frequency in pitch,  no bruits, no rebound, no guarding, no midline pulsatile mass, no hepatomegaly, no splenomegaly EXT:  2 plus pulses throughout, moderate ankle edema, no cyanosis no clubbing SKIN:  No rashes no nodules NEURO:  Cranial nerves II through XII grossly intact, motor grossly intact throughout [facility]:  Cognitively intact, oriented to person place and time    EKG:  EKG Interpretation Date/Time:  Friday October 02 2022 15:17:30 EDT Ventricular Rate:  68 PR Interval:  220 QRS Duration:  70 QT Interval:  388 QTC Calculation: 412 R Axis:   -4  Text Interpretation: Sinus rhythm with sinus arrhythmia with 1st degree A-V block When compared with ECG of 02-Aug-2022 10:13, No significant change since last tracing Confirmed by Rollene Rotunda ([id number]) on 10/02/2022 3:41:35 PM     Recent Labs: 08/02/2022: ALT 23; B Natriuretic Peptide 360.1; BUN 17; Creatinine, Ser 0.90; Hemoglobin 9.7; Platelets 234; Potassium 3.6; Sodium 137    Lipid Panel    Component Value Date/Time   CHOL 169 02/09/2011 0545   TRIG 143 02/09/2011 0545   HDL 33 (L) 02/09/2011 0545   CHOLHDL 5.1 02/09/2011 0545   VLDL 29 02/09/2011 0545   LDLCALC 107 (H) 02/09/2011 0545      Wt Readings from Last 3 Encounters:  10/02/22 156 lb (70.8 kg)  08/02/22 155 lb (70.3 kg)  06/11/21 188 lb 11.4 oz (85.6 kg)      Other studies Reviewed: Additional  studies/ records that were reviewed today include: ED records. Review of the above records demonstrates:  Please see elsewhere in the note.     ASSESSMENT AND PLAN:  Chest pain: Her chest discomfort has nonanginal greater than anginal features.  She does have coronary calcium noted on CT and I expect some plaque but I do not think further testing is indicated in the absence of ongoing symptoms.  She needs just primary risk reduction.  DM: Her A1c was actually 5.1.  She was diabetic but lost 87 pounds a few years ago when her husband died.  No change in therapy.  HTN: She actually reports her blood pressure has been running low.  Today it is a little bit elevated.  I see that she was started on spironolactone and telmisartan was discontinued recently.  She is continuing her carvedilol.  I asked her to keep a blood pressure diary.  Edema: She is now on the spironolactone which was done I think in part for blood pressure but also may be for her lower extremity swelling.  I would agree with this and she can keep an eye on her blood pressure to make sure it does not drop too low.  I will defer follow-up blood work to her primary provider.  She should wear her compression stockings.   Current medicines are reviewed at length with the patient today.  The patient does not have concerns regarding medicines.  The following changes have been made:  no change  Labs/ tests ordered today include:   Orders Placed This Encounter  Procedures   EKG 12-Lead     Disposition:   FU with me as needed if she has any recurrent chest pain.   Signed, Rollene Rotunda, MD  10/02/2022 5:19 PM    [facility]

## 2022-10-02 ENCOUNTER — Ambulatory Visit: Payer: No Typology Code available for payment source | Attending: Cardiology | Admitting: Cardiology

## 2022-10-02 ENCOUNTER — Encounter: Payer: Self-pay | Admitting: Cardiology

## 2022-10-02 VITALS — BP 146/80 | HR 68 | Ht 62.0 in | Wt 156.0 lb

## 2022-10-02 DIAGNOSIS — E118 Type 2 diabetes mellitus with unspecified complications: Secondary | ICD-10-CM | POA: Diagnosis not present

## 2022-10-02 DIAGNOSIS — Z7984 Long term (current) use of oral hypoglycemic drugs: Secondary | ICD-10-CM

## 2022-10-02 DIAGNOSIS — I10 Essential (primary) hypertension: Secondary | ICD-10-CM

## 2022-10-02 DIAGNOSIS — R072 Precordial pain: Secondary | ICD-10-CM

## 2022-10-02 NOTE — Patient Instructions (Signed)
Medication Instructions:  Your physician recommends that you continue on your current medications as directed. Please refer to the Current Medication list given to you today.  *If you need a refill on your cardiac medications before your next appointment, please call your pharmacy*   Lab Work: None If you have labs (blood work) drawn today and your tests are completely normal, you will receive your results only by: [place] (if you have MyChart) OR A paper copy in the mail If you have any lab test that is abnormal or we need to change your treatment, we will call you to review the results.   Testing/Procedures: None   Follow-Up: At [facility], you and your health needs are our priority.  As part of our continuing mission to provide you with exceptional heart care, we have created designated Provider Care Teams.  These Care Teams include your primary Cardiologist (physician) and Advanced Practice Providers (APPs -  Physician Assistants and Nurse Practitioners) who all work together to provide you with the care you need, when you need it.  We recommend signing up for the patient portal called "MyChart".  Sign up information is provided on this After Visit Summary.  MyChart is used to connect with patients for Virtual Visits (Telemedicine).  Patients are able to view lab/test results, encounter notes, upcoming appointments, etc.  Non-urgent messages can be sent to your provider as well.   To learn more about what you can do with MyChart, go to [email or web address].    Your next appointment:   As needed  The format for your next appointment:   In Person  Provider:   Minus Breeding, MD     Other Instructions

## 2023-01-18 DIAGNOSIS — R946 Abnormal results of thyroid function studies: Secondary | ICD-10-CM | POA: Diagnosis not present

## 2023-01-18 DIAGNOSIS — D649 Anemia, unspecified: Secondary | ICD-10-CM | POA: Diagnosis not present

## 2023-01-18 DIAGNOSIS — K7460 Unspecified cirrhosis of liver: Secondary | ICD-10-CM | POA: Diagnosis not present

## 2023-01-18 DIAGNOSIS — N390 Urinary tract infection, site not specified: Secondary | ICD-10-CM | POA: Diagnosis not present

## 2023-02-25 DIAGNOSIS — K7460 Unspecified cirrhosis of liver: Secondary | ICD-10-CM | POA: Diagnosis not present

## 2023-02-25 DIAGNOSIS — N1831 Chronic kidney disease, stage 3a: Secondary | ICD-10-CM | POA: Diagnosis not present

## 2023-02-25 DIAGNOSIS — K760 Fatty (change of) liver, not elsewhere classified: Secondary | ICD-10-CM | POA: Diagnosis not present

## 2023-02-25 DIAGNOSIS — I10 Essential (primary) hypertension: Secondary | ICD-10-CM | POA: Diagnosis not present

## 2023-02-25 DIAGNOSIS — E1122 Type 2 diabetes mellitus with diabetic chronic kidney disease: Secondary | ICD-10-CM | POA: Diagnosis not present

## 2023-02-25 DIAGNOSIS — N390 Urinary tract infection, site not specified: Secondary | ICD-10-CM | POA: Diagnosis not present

## 2023-02-25 DIAGNOSIS — J849 Interstitial pulmonary disease, unspecified: Secondary | ICD-10-CM | POA: Diagnosis not present

## 2023-02-25 DIAGNOSIS — R748 Abnormal levels of other serum enzymes: Secondary | ICD-10-CM | POA: Diagnosis not present

## 2023-02-25 DIAGNOSIS — I129 Hypertensive chronic kidney disease with stage 1 through stage 4 chronic kidney disease, or unspecified chronic kidney disease: Secondary | ICD-10-CM | POA: Diagnosis not present

## 2023-02-25 DIAGNOSIS — R634 Abnormal weight loss: Secondary | ICD-10-CM | POA: Diagnosis not present

## 2023-02-25 DIAGNOSIS — B379 Candidiasis, unspecified: Secondary | ICD-10-CM | POA: Diagnosis not present

## 2023-02-25 DIAGNOSIS — M159 Polyosteoarthritis, unspecified: Secondary | ICD-10-CM | POA: Diagnosis not present

## 2023-07-01 DIAGNOSIS — R8271 Bacteriuria: Secondary | ICD-10-CM | POA: Diagnosis not present

## 2023-07-01 DIAGNOSIS — N3020 Other chronic cystitis without hematuria: Secondary | ICD-10-CM | POA: Diagnosis not present

## 2023-07-01 DIAGNOSIS — N3946 Mixed incontinence: Secondary | ICD-10-CM | POA: Diagnosis not present

## 2023-07-28 DIAGNOSIS — R600 Localized edema: Secondary | ICD-10-CM | POA: Diagnosis not present

## 2023-07-28 DIAGNOSIS — M51362 Other intervertebral disc degeneration, lumbar region with discogenic back pain and lower extremity pain: Secondary | ICD-10-CM | POA: Diagnosis not present

## 2023-07-28 DIAGNOSIS — Z1624 Resistance to multiple antibiotics: Secondary | ICD-10-CM | POA: Diagnosis not present

## 2023-07-28 DIAGNOSIS — J849 Interstitial pulmonary disease, unspecified: Secondary | ICD-10-CM | POA: Diagnosis not present

## 2023-07-28 DIAGNOSIS — N1831 Chronic kidney disease, stage 3a: Secondary | ICD-10-CM | POA: Diagnosis not present

## 2023-07-28 DIAGNOSIS — Z87442 Personal history of urinary calculi: Secondary | ICD-10-CM | POA: Diagnosis not present

## 2023-08-23 DIAGNOSIS — K7460 Unspecified cirrhosis of liver: Secondary | ICD-10-CM | POA: Diagnosis not present

## 2023-08-23 DIAGNOSIS — N1831 Chronic kidney disease, stage 3a: Secondary | ICD-10-CM | POA: Diagnosis not present

## 2023-08-23 DIAGNOSIS — E1122 Type 2 diabetes mellitus with diabetic chronic kidney disease: Secondary | ICD-10-CM | POA: Diagnosis not present

## 2023-08-23 DIAGNOSIS — E559 Vitamin D deficiency, unspecified: Secondary | ICD-10-CM | POA: Diagnosis not present

## 2023-08-23 DIAGNOSIS — I10 Essential (primary) hypertension: Secondary | ICD-10-CM | POA: Diagnosis not present

## 2023-08-23 DIAGNOSIS — I129 Hypertensive chronic kidney disease with stage 1 through stage 4 chronic kidney disease, or unspecified chronic kidney disease: Secondary | ICD-10-CM | POA: Diagnosis not present

## 2023-08-23 DIAGNOSIS — R634 Abnormal weight loss: Secondary | ICD-10-CM | POA: Diagnosis not present

## 2023-08-23 DIAGNOSIS — Z13220 Encounter for screening for lipoid disorders: Secondary | ICD-10-CM | POA: Diagnosis not present

## 2023-08-30 DIAGNOSIS — K7460 Unspecified cirrhosis of liver: Secondary | ICD-10-CM | POA: Diagnosis not present

## 2023-08-30 DIAGNOSIS — N1831 Chronic kidney disease, stage 3a: Secondary | ICD-10-CM | POA: Diagnosis not present

## 2023-08-30 DIAGNOSIS — R32 Unspecified urinary incontinence: Secondary | ICD-10-CM | POA: Diagnosis not present

## 2023-08-30 DIAGNOSIS — N390 Urinary tract infection, site not specified: Secondary | ICD-10-CM | POA: Diagnosis not present

## 2023-08-30 DIAGNOSIS — F3340 Major depressive disorder, recurrent, in remission, unspecified: Secondary | ICD-10-CM | POA: Diagnosis not present

## 2023-08-30 DIAGNOSIS — E1122 Type 2 diabetes mellitus with diabetic chronic kidney disease: Secondary | ICD-10-CM | POA: Diagnosis not present

## 2023-08-30 DIAGNOSIS — K7581 Nonalcoholic steatohepatitis (NASH): Secondary | ICD-10-CM | POA: Diagnosis not present

## 2023-08-30 DIAGNOSIS — Z0000 Encounter for general adult medical examination without abnormal findings: Secondary | ICD-10-CM | POA: Diagnosis not present

## 2023-08-30 DIAGNOSIS — F411 Generalized anxiety disorder: Secondary | ICD-10-CM | POA: Diagnosis not present

## 2023-08-30 DIAGNOSIS — R053 Chronic cough: Secondary | ICD-10-CM | POA: Diagnosis not present

## 2023-08-30 DIAGNOSIS — M8580 Other specified disorders of bone density and structure, unspecified site: Secondary | ICD-10-CM | POA: Diagnosis not present

## 2023-08-30 DIAGNOSIS — J849 Interstitial pulmonary disease, unspecified: Secondary | ICD-10-CM | POA: Diagnosis not present

## 2023-08-31 DIAGNOSIS — N3946 Mixed incontinence: Secondary | ICD-10-CM | POA: Diagnosis not present

## 2023-09-05 DIAGNOSIS — Z1211 Encounter for screening for malignant neoplasm of colon: Secondary | ICD-10-CM | POA: Diagnosis not present

## 2023-09-05 DIAGNOSIS — Z1212 Encounter for screening for malignant neoplasm of rectum: Secondary | ICD-10-CM | POA: Diagnosis not present

## 2023-09-13 ENCOUNTER — Other Ambulatory Visit (HOSPITAL_COMMUNITY): Payer: Self-pay

## 2023-09-13 DIAGNOSIS — H16223 Keratoconjunctivitis sicca, not specified as Sjogren's, bilateral: Secondary | ICD-10-CM | POA: Diagnosis not present

## 2023-09-13 DIAGNOSIS — H40013 Open angle with borderline findings, low risk, bilateral: Secondary | ICD-10-CM | POA: Diagnosis not present

## 2023-09-13 DIAGNOSIS — Z961 Presence of intraocular lens: Secondary | ICD-10-CM | POA: Diagnosis not present

## 2023-09-13 DIAGNOSIS — R7303 Prediabetes: Secondary | ICD-10-CM | POA: Diagnosis not present

## 2023-09-25 LAB — COLOGUARD: COLOGUARD: NEGATIVE

## 2023-10-08 DIAGNOSIS — R8271 Bacteriuria: Secondary | ICD-10-CM | POA: Diagnosis not present

## 2023-10-08 DIAGNOSIS — N3020 Other chronic cystitis without hematuria: Secondary | ICD-10-CM | POA: Diagnosis not present

## 2023-10-08 DIAGNOSIS — N3946 Mixed incontinence: Secondary | ICD-10-CM | POA: Diagnosis not present

## 2023-10-15 ENCOUNTER — Other Ambulatory Visit: Payer: Self-pay | Admitting: Urology

## 2023-10-21 DIAGNOSIS — R8271 Bacteriuria: Secondary | ICD-10-CM | POA: Diagnosis not present

## 2023-11-04 NOTE — Patient Instructions (Signed)
 SURGICAL WAITING ROOM VISITATION  Patients having surgery or a procedure may have no more than 2 support people in the waiting area - these visitors may rotate.    Children under the age of 30 must have an adult with them who is not the patient.  Visitors with respiratory illnesses are discouraged from visiting and should remain at home.  If the patient needs to stay at the hospital during part of their recovery, the visitor guidelines for inpatient rooms apply. Pre-op nurse will coordinate an appropriate time for 1 support person to accompany patient in pre-op.  This support person may not rotate.    Please refer to the [facility] website for the visitor guidelines for Inpatients (after your surgery is over and you are in a regular room).       Your procedure is scheduled on: 11/09/23   Report to [facility] Main Entrance    Report to admitting at 8:15 AM   Call this number if you have problems the morning of surgery [phone]   Do not eat food or drink any liquids :After Midnight.but may have sips of water with meds.       Oral Hygiene is also important to reduce your risk of infection.                                    Remember - BRUSH YOUR TEETH THE MORNING OF SURGERY WITH YOUR REGULAR TOOTHPASTE  DENTURES WILL BE REMOVED PRIOR TO SURGERY PLEASE DO NOT APPLY Poly grip OR ADHESIVES!!!   Stop all vitamins and herbal supplements 7 days before surgery.   Take these medicines the morning of surgery with A SIP OF WATER: tylenol , wellbutrin, carvedilol , keflex , clorazepate , estradiol, nasal spray, omeprazole  DO NOT TAKE ANY ORAL DIABETIC MEDICATIONS DAY OF YOUR SURGERY             You may not have any metal on your body including hair pins, jewelry, and body piercing             Do not wear make-up, lotions, powders, perfumes/cologne, or deodorant  Do not wear nail polish including gel and S&S, artificial/acrylic nails, or any other type of covering on natural  nails including finger and toenails. If you have artificial nails, gel coating, etc. that needs to be removed by a nail salon please have this removed prior to surgery or surgery may need to be canceled/ delayed if the surgeon/ anesthesia feels like they are unable to be safely monitored.   Do not shave  48 hours prior to surgery.    Do not bring valuables to the hospital. [facility] IS NOT             RESPONSIBLE   FOR VALUABLES.   Contacts, glasses, dentures or bridgework may not be worn into surgery.  DO NOT BRING YOUR HOME MEDICATIONS TO THE HOSPITAL. PHARMACY WILL DISPENSE MEDICATIONS LISTED ON YOUR MEDICATION LIST TO YOU DURING YOUR ADMISSION IN THE HOSPITAL!    Patients discharged on the day of surgery will not be allowed to drive home.  Someone NEEDS to stay with you for the first 24 hours after anesthesia.   Special Instructions: Bring a copy of your healthcare power of attorney and living will documents the day of surgery if you haven't scanned them before.              Please read  over the following fact sheets you were given: IF YOU HAVE QUESTIONS ABOUT YOUR PRE-OP INSTRUCTIONS PLEASE [phone] Shannon Lynch   If you received a COVID test during your pre-op visit  it is requested that you wear a mask when out in public, stay away from anyone that may not be feeling well and notify your surgeon if you develop symptoms. If you test positive for Covid or have been in contact with anyone that has tested positive in the last 10 days please notify you surgeon.    Shannon Lynch - Preparing for Surgery Before surgery, you can play an important role.  Because skin is not sterile, your skin needs to be as free of germs as possible.  You can reduce the number of germs on your skin by washing with CHG (chlorahexidine gluconate) soap before surgery.  CHG is an antiseptic cleaner which kills germs and bonds with the skin to continue killing germs even after washing. Please DO NOT use if you have  an allergy to CHG or antibacterial soaps.  If your skin becomes reddened/irritated stop using the CHG and inform your nurse when you arrive at Short Stay. Do not shave (including legs and underarms) for at least 48 hours prior to the first CHG shower.  You may shave your face/neck.  Please follow these instructions carefully:  1.  Shower with CHG Soap the night before surgery and the morning of surgery.  2.  If you choose to wash your hair, wash your hair first as usual with your normal  shampoo.  3.  After you shampoo, rinse your hair and body thoroughly to remove the shampoo.                             4.  Use CHG as you would any other liquid soap.  You can apply chg directly to the skin and wash.  Gently with a scrungie or clean washcloth.  5.  Apply the CHG Soap to your body ONLY FROM THE NECK DOWN.   Do   not use on face/ open                           Wound or open sores. Avoid contact with eyes, ears mouth and   genitals (private parts).                       Wash face,  Genitals (private parts) with your normal soap.             6.  Wash thoroughly, paying special attention to the area where your    surgery  will be performed.  7.  Thoroughly rinse your body with warm water from the neck down.  8.  DO NOT shower/wash with your normal soap after using and rinsing off the CHG Soap.                9.  Pat yourself dry with a clean towel.            10.  Wear clean pajamas.            11.  Place clean sheets on your bed the night of your first shower and do not  sleep with pets. Day of Surgery : Do not apply any CHG, lotions/deodorants the morning of surgery.  Please wear clean clothes to the hospital/surgery center.  FAILURE TO  FOLLOW THESE INSTRUCTIONS MAY RESULT IN THE CANCELLATION OF YOUR SURGERY  PATIENT SIGNATURE_________________________________  NURSE SIGNATURE__________________________________  ________________________________________________________________________

## 2023-11-04 NOTE — Progress Notes (Signed)
 COVID Vaccine received:  []  No [x]  Yes Date of any COVID positive Test in last 90 days: no PCP - Dr. Lonell Collet Cardiologist - Dr. Lynwood Schilling  Chest x-ray -  EKG -   Stress Test - 02/21/14 Epic ECHO -01/22/14 Epic  Cardiac Cath -   Bowel Prep - [x]  No  []   Yes ______  Pacemaker / ICD device [x]  No []  Yes   Spinal Cord Stimulator:[x]  No []  Yes       History of Sleep Apnea? [x]  No []  Yes   CPAP used?- [x]  No []  Yes    Does the patient monitor blood sugar?          [x]  No []  Yes  []  N/A  Patient has: []  NO Hx DM   [x]  Pre-DM                 []  DM1  []   DM2 Does patient have a [employer] or Dexacom? [x]  No []  Yes   Fasting Blood Sugar Ranges-  Checks Blood Sugar ____0_ times a day  GLP1 agonist / usual dose - no GLP1 instructions:  SGLT-2 inhibitors / usual dose - no SGLT-2 instructions:   Blood Thinner / Instructions:no Aspirin  Instructions:no  Comments:   Activity level: Patient is able  to climb a flight of stairs without difficulty; [x]  No CP  [x]  No SOB,   Patient can perform ADLs without assistance.   Anesthesia review: !st degree block,COPD, HTN  Patient denies shortness of breath, fever, cough and chest pain at PAT appointment.  Patient verbalized understanding and agreement to the Pre-Surgical Instructions that were given to them at this PAT appointment. Patient was also educated of the need to review these PAT instructions again prior to his/her surgery.I reviewed the appropriate phone numbers to call if they have any and questions or concerns.

## 2023-11-05 ENCOUNTER — Other Ambulatory Visit: Payer: Self-pay

## 2023-11-05 ENCOUNTER — Encounter (HOSPITAL_COMMUNITY)
Admission: RE | Admit: 2023-11-05 | Discharge: 2023-11-05 | Disposition: A | Discharge status: Home / Self Care | Source: Ambulatory Visit | Attending: Urology | Admitting: Urology

## 2023-11-05 ENCOUNTER — Encounter (HOSPITAL_COMMUNITY): Payer: Self-pay

## 2023-11-05 VITALS — BP 134/56 | HR 72 | Temp 98.0°F | Resp 16 | Ht 62.0 in | Wt 140.0 lb

## 2023-11-05 DIAGNOSIS — I440 Atrioventricular block, first degree: Secondary | ICD-10-CM | POA: Insufficient documentation

## 2023-11-05 DIAGNOSIS — E119 Type 2 diabetes mellitus without complications: Secondary | ICD-10-CM | POA: Diagnosis not present

## 2023-11-05 DIAGNOSIS — I10 Essential (primary) hypertension: Secondary | ICD-10-CM | POA: Insufficient documentation

## 2023-11-05 DIAGNOSIS — N393 Stress incontinence (female) (male): Secondary | ICD-10-CM | POA: Diagnosis not present

## 2023-11-05 DIAGNOSIS — Z87891 Personal history of nicotine dependence: Secondary | ICD-10-CM | POA: Diagnosis not present

## 2023-11-05 DIAGNOSIS — J449 Chronic obstructive pulmonary disease, unspecified: Secondary | ICD-10-CM | POA: Insufficient documentation

## 2023-11-05 DIAGNOSIS — Z01818 Encounter for other preprocedural examination: Secondary | ICD-10-CM | POA: Diagnosis not present

## 2023-11-05 LAB — CBC
HCT: 33.6 % — ABNORMAL LOW (ref 36.0–46.0)
Hemoglobin: 10.7 g/dL — ABNORMAL LOW (ref 12.0–15.0)
MCH: 34.9 pg — ABNORMAL HIGH (ref 26.0–34.0)
MCHC: 31.8 g/dL (ref 30.0–36.0)
MCV: 109.4 fL — ABNORMAL HIGH (ref 80.0–100.0)
Platelets: 296 K/uL (ref 150–400)
RBC: 3.07 MIL/uL — ABNORMAL LOW (ref 3.87–5.11)
RDW: 14.7 % (ref 11.5–15.5)
WBC: 8.2 K/uL (ref 4.0–10.5)
nRBC: 0 % (ref 0.0–0.2)

## 2023-11-05 LAB — BASIC METABOLIC PANEL WITH GFR
Anion gap: 9 (ref 5–15)
BUN: 28 mg/dL — ABNORMAL HIGH (ref 8–23)
CO2: 26 mmol/L (ref 22–32)
Calcium: 9.9 mg/dL (ref 8.9–10.3)
Chloride: 105 mmol/L (ref 98–111)
Creatinine, Ser: 1.36 mg/dL — ABNORMAL HIGH (ref 0.44–1.00)
GFR, Estimated: 39 mL/min — ABNORMAL LOW (ref >60)
Glucose, Bld: 159 mg/dL — ABNORMAL HIGH (ref 70–99)
Potassium: 4 mmol/L (ref 3.5–5.1)
Sodium: 140 mmol/L (ref 135–145)

## 2023-11-05 LAB — HEMOGLOBIN A1C
Hgb A1c MFr Bld: 3.9 % — ABNORMAL LOW (ref 4.8–5.6)
Mean Plasma Glucose: 65.23 mg/dL

## 2023-11-05 NOTE — H&P (Signed)
 CC/HPI: cc: Urolithiasis, recurrent UTIs, urinary incontinence   08/04/2021: 78 year old woman with a history of urolithiasis previously followed by Dr. Watt comes in with recurrent UTIs and urinary incontinence. Patient states she leaks with coughing and sneezing, urgency and without any sensation. She also leaks overnight. She is also been treated for multiple UTIs but says she is asymptomatic. She has not passed any stones recently.   07/01/2023: 78 year old woman last seen in July 2023 here for the same problems. These include chronic cystitis and mixed urinary incontinence. After her last visit we discussed a urodynamic study as well as vaginal estradiol and a bladder probiotic. Patient does not remember this visit or the recommendations. She has been experiencing dysuria for the last 2 weeks. It sounds like she has urge predominant mixed urinary continence. It is very difficult to converse with her and stay on track. At first patient seems to be asymptomatic today but now she says she has dysuria.   10/08/23: 78 year old woman with a history of recurrent UTIs and urinary incontinence here for follow-up after urodynamic study. Urodynamic study showed stress urinary continence as well as incomplete emptying. She has been using vaginal estradiol and that has helped her.   UDS SUMMARY  Ms. Moishe held a max capacity of approx. 429 mls. Her 1st sensation was felt at 200 mls. There was positive SUI. She leaked with coughing. Please see information above. No instability was noted. She did not generate a voluntary contraction and void in the clinic setting but did void 200 ml in a private restroom. PVR was approx. 229 mls. During cough and Valsalva, the bladder descended approximately 0-1 cm. No trabeculation was noted. No reflux was seen.     ALLERGIES: Ciprofloxacin codeine Prednisone Sulfa-methoxazole    MEDICATIONS: Allopurinol  Metformin  Hcl  Omeprazole  [employer]  Bupropion Xl 1 tablet PO  Daily  Carvedilol   Clorazepate  Dipotassium 3.75 MG Tablet 1 tablet PO Daily  Estradiol 0.1 MG/GM Cream 1 gram Transdermal Q HS Place 1 g of cream in the vagina nightly for 14 days then twice a week thereafter  Nutrafol  Telmisartan 80 MG Tablet 1 tablet PO Daily  Tylenol   Vitamin C   Vitamin D3     GU PSH: Complex cystometrogram, w/ void pressure and urethral pressure profile studies, any technique - 08/31/2023 Complex Uroflow - 08/31/2023 Cysto Remove Stent FB Sim - 2021, 2020 Cystoscopy Insert Stent, Left - 2020 Emg surf Electrd - 08/31/2023 ESWL, Left - 2020 Inject For cystogram - 08/31/2023 Intrabd voidng Press - 08/31/2023 Ureteroscopic laser litho, Left - 2021 Ureteroscopic stone removal, Left - 2021     NON-GU PSH: Knee replacement, Bilateral Visit Complexity (formerly GPC1X) - 07/01/2023     GU PMH: Mixed incontinence - 08/31/2023, - 07/01/2023, - 2023, She has fairly severe incontinence and been started on a new med but can't recall what it is. We will contact the pharmacy to find out. , - 2020 Chronic cystitis (w/o hematuria) - 07/01/2023, - 2023 Renal calculus - 2023, She has small stable left renal stones with a possible small right renal stone. I will have her stay on the potassium citrate and f/u in 6 months. , - 2021, 3-4 non-obstructing calculi noted in the left kidney with the largest measuring approx. 5mm in width in the LLP. No obvious right renal or ureteral calculi grossly seen on my independent review of CT imaging today., - 2021, She has a 7mm UPJ fragment and additional upper, mid and lower left renal  stones. I discussed options and will get her set up for URS but explained that it might be a staged procedure. I have reviewed the risks of ureteroscopy including bleeding, infection, ureteral injury, need for a stent or secondary procedures, thrombotic events and anesthetic complications. , - 2021, She has some residual stone burden. I discussed options and will have her return in  3months for a CT stone study. , - 2020, She has residual fragments in the left kidney but no symptoms. I will have her try to do some more postural drainage maneuvers. She will return with a KUB in 1 month. , - 2020, - 2020, Left, She is for left ESWL on Monday and I reviewed the risks again. She will return for stent removal on 09/27/18. I reviewed the risks of ESWL including bleeding, infection, injury to the kidney or adjacent structures, failure to fragment the stone, need for ancillary procedures, thrombotic events, cardiac arrhythmias and sedation complications. , - 2020 Urinary Tract Inf, Unspec site, I will culture the urine today and treat as needed. - 2021, Her UA is abnormal today and I will get a culture today. She will cut the Augmentin  back to once daily to see if that will help the nausea. She may need an additional antibiotic preop if the culture is positive. I will notify Dr. Devere. , - 2020    NON-GU PMH: Bacteriuria - 07/01/2023, - 2023 Hyperuricemia, Her uric acid was 6.5 prior to allopurinol. I will recheck that today. - 2021 Arthritis COPD Diabetes Type 2 Hypercholesterolemia    FAMILY HISTORY: No Family History    SOCIAL HISTORY: Marital Status: Unknown Preferred Language: English; Race: White Current Smoking Status: Patient does not smoke anymore.   Tobacco Use Assessment Completed: Used Tobacco in last 30 days?    REVIEW OF SYSTEMS:    GU Review Female:   Patient denies frequent urination, hard to postpone urination, burning /pain with urination, get up at night to urinate, leakage of urine, stream starts and stops, trouble starting your stream, have to strain to urinate, and being pregnant.  Gastrointestinal (Upper):   Patient denies nausea, vomiting, and indigestion/ heartburn.  Gastrointestinal (Lower):   Patient denies diarrhea and constipation.  Constitutional:   Patient denies weight loss, night sweats, fever, and fatigue.  Skin:   Patient denies skin rash/  lesion and itching.  Eyes:   Patient denies blurred vision and double vision.  Ears/ Nose/ Throat:   Patient denies sore throat and sinus problems.  Hematologic/Lymphatic:   Patient denies swollen glands and easy bruising.  Cardiovascular:   Patient denies leg swelling and chest pains.  Respiratory:   Patient denies cough and shortness of breath.  Endocrine:   Patient denies excessive thirst.  Musculoskeletal:   Patient denies back pain and joint pain.  Neurological:   Patient denies headaches and dizziness.  Psychologic:   Patient denies depression and anxiety.   VITAL SIGNS: None   MULTI-SYSTEM PHYSICAL EXAMINATION:    Constitutional: Well-nourished. No physical deformities. Normally developed. Good grooming.  Neck: Neck symmetrical, not swollen. Normal tracheal position.  Respiratory: No labored breathing, no use of accessory muscles.   Skin: No paleness, no jaundice, no cyanosis. No lesion, no ulcer, no rash.  Neurologic / Psychiatric: Oriented to time, oriented to place, oriented to person. No depression, no anxiety, no agitation.  Eyes: Normal conjunctivae. Normal eyelids.  Ears, Nose, Mouth, and Throat: Left ear no scars, no lesions, no masses. Right ear no scars, no lesions, no  masses. Nose no scars, no lesions, no masses. Normal hearing. Normal lips.  Musculoskeletal: Normal gait and station of head and neck.     Complexity of Data:  Records Review:   Previous Patient Records, POC Tool  Urine Test Review:   Urinalysis  Urodynamics Review:   Review Urodynamics Tests   PROCEDURES:          Visit Complexity - G2211    ASSESSMENT:      ICD-10 Details  1 GU:   Chronic cystitis (w/o hematuria) - N30.20 Chronic, Stable  2   Mixed incontinence - N39.46 Chronic, Stable  3 NON-GU:   Bacteriuria - R82.71 Chronic, Stable   PLAN:           Orders Labs CULTURE, URINE          Document Letter(s):  Created for Patient: Clinical Summary         Notes:   1. Mixed urinary  incontinence:  - I reviewed patient's urodynamic study which showed incomplete emptying as well stress urinary continence. We discussed moving forward with [employer]. Risks and benefits were discussed including but not limited to pain, bleeding, infection, damage to surrounding structures, need for Foley catheter, urinary retention, failure to improve symptoms.  - Continue vaginal estradiol   Schedule procedure

## 2023-11-08 NOTE — Anesthesia Preprocedure Evaluation (Signed)
 Anesthesia Evaluation  Patient identified by MRN, date of birth, ID band Patient awake    Reviewed: Allergy & Precautions, NPO status , Patient's Chart, lab work & pertinent test results  Airway Mallampati: II  TM Distance: >3 FB Neck ROM: Full    Dental no notable dental hx.    Pulmonary COPD, former smoker   Pulmonary exam normal        Cardiovascular hypertension,  Rhythm:Regular Rate:Normal     Neuro/Psych   Anxiety Depression       GI/Hepatic Neg liver ROS,GERD  Medicated,,  Endo/Other  diabetes    Renal/GU Renal disease Bladder dysfunction      Musculoskeletal  (+) Arthritis , Osteoarthritis,    Abdominal Normal abdominal exam  (+)   Peds  Hematology negative hematology ROS (+)   Anesthesia Other Findings   Reproductive/Obstetrics                              Anesthesia Physical Anesthesia Plan  ASA: 2  Anesthesia Plan: MAC   Post-op Pain Management:    Induction: Intravenous  PONV Risk Score and Plan: 2 and Ondansetron , Dexamethasone, Propofol  infusion and Treatment may vary due to age or medical condition  Airway Management Planned: Simple Face Mask and Nasal Cannula  Additional Equipment: None  Intra-op Plan:   Post-operative Plan:   Informed Consent: I have reviewed the patients History and Physical, chart, labs and discussed the procedure including the risks, benefits and alternatives for the proposed anesthesia with the patient or authorized representative who has indicated his/her understanding and acceptance.     Dental advisory given  Plan Discussed with: CRNA  Anesthesia Plan Comments: (See PAT note 11/05/2023)         Anesthesia Quick Evaluation

## 2023-11-08 NOTE — Progress Notes (Signed)
 Anesthesia Chart Review   Case: [id number] Date/Time: 11/09/23 1015   Procedures:      CYSTOSCOPY - CYSTOSCOPY WITH BULKAMID     INJECTION, BULKING AGENT, URETHRA   Anesthesia type: Monitor Anesthesia Care   Diagnosis: Female stress incontinence [N39.3]   Pre-op diagnosis: STRESS URINARY INCONTIENCE   Location: [facility] PROCEDURE [facility] / [facility]   Surgeons: Shannon Lynch BIRCH, Lynch       DISCUSSION:78 y.o. former smoker with h/o HTN, COPD, DM II, stress urinary incontinence scheduled for above procedure 11/09/2023 with Dr. Valli Shannon.   Pt evaluated by cardiology 10/02/2022 for chest pain.  Per note, She had a perfusion study in 2016 for evaluation of chest pain.  This was negative.  I also see an echocardiogram with moderate LVH done in 2015.  There were no significant valvular abnormalities.  Her chest discomfort has nonanginal greater than anginal features. She does have coronary calcium  noted on CT and I expect some plaque but I do not think further testing is indicated in the absence of ongoing symptoms. She needs just primary risk reduction.   Pt reports she can climb a flight of stairs without chest pain or shortness of breath.  VS: BP (!) 134/56   Pulse 72   Temp 36.7 C (Oral)   Resp 16   Ht 5' 2 (1.575 m)   Wt 63.5 kg   LMP 06/23/1995   SpO2 97%   BMI 25.61 kg/m   PROVIDERS: Shannon Brunet, DO is PCP   Cardiologist - Dr. Lynwood Lynch  LABS: Labs reviewed: Acceptable for surgery. (all labs ordered are listed, but only abnormal results are displayed)  Labs Reviewed  BASIC METABOLIC PANEL WITH GFR - Abnormal; Notable for the following components:      Result Value   Glucose, Bld 159 (*)    BUN 28 (*)    Creatinine, Ser 1.36 (*)    GFR, Estimated 39 (*)    All other components within normal limits  CBC - Abnormal; Notable for the following components:   RBC 3.07 (*)    Hemoglobin 10.7 (*)    HCT 33.6 (*)    MCV 109.4 (*)    MCH 34.9 (*)    All other components  within normal limits  HEMOGLOBIN A1C - Abnormal; Notable for the following components:   Hgb A1c MFr Bld 3.9 (*)    All other components within normal limits     IMAGES:   EKG:   CV: Echo 01/22/2014 - Left ventricle: The cavity size was normal. Wall thickness was    increased in a pattern of moderate LVH. There was focal basal    hypertrophy. Systolic function was normal. The estimated ejection    fraction was in the range of 55% to 60%. Wall motion was normal;    there were no regional wall motion abnormalities. Doppler    parameters are consistent with abnormal left ventricular    relaxation (grade 1 diastolic dysfunction).  - Mitral valve: Mildly to moderately calcified annulus.  Past Medical History:  Diagnosis Date   Anxiety    Arthritis    Chronic UTI (urinary tract infection)    COPD (chronic obstructive pulmonary disease) (HCC)    mild   Depression    Diabetes mellitus without complication (HCC)    Exogenous obesity    History of kidney stones    Hx of cardiovascular stress test    a. Nuclear Stress Test (02/14/14):  No ischemia, EF 63%, Normal Study  Hx of echocardiogram    a. Echocardiogram (01/22/14):  Mod LVH, EF 55-60%, no RWMA, Gr 1 DD, mild to mod MAC   Hypertension    Osteonecrosis (HCC)     Past Surgical History:  Procedure Laterality Date   CATARACT EXTRACTION     CYSTOSCOPY W/ URETERAL STENT PLACEMENT Left 09/05/2018   Procedure: CYSTOSCOPY WITH URETERAL STENT PLACEMENT;  Surgeon: Shannon Lynch;  Location: [facility];  Service: Urology;  Laterality: Left;   CYSTOSCOPY/URETEROSCOPY/HOLMIUM LASER/STENT PLACEMENT Left 04/11/2019   Procedure: CYSTOSCOPY LEFT RETROGRADE /URETEROSCOPY/HOLMIUM LASER/STENT PLACEMENT;  Surgeon: Shannon Lynch;  Location: [facility];  Service: Urology;  Laterality: Left;   EXTRACORPOREAL SHOCK WAVE LITHOTRIPSY Left 09/19/2018   Procedure: EXTRACORPOREAL SHOCK WAVE LITHOTRIPSY (ESWL);  Surgeon: Shannon Lynch;   Location: [facility];  Service: Urology;  Laterality: Left;   FINGER SURGERY     related to tumors on finger removal    TOTAL KNEE ARTHROPLASTY     TOTAL KNEE ARTHROPLASTY Left 09/26/2013   Procedure: LEFT TOTAL KNEE ARTHROPLASTY;  Surgeon: Shannon Lynch;  Location: [facility];  Service: Orthopedics;  Laterality: Left;   TUBAL LIGATION      MEDICATIONS:  acetaminophen  (TYLENOL ) 650 MG CR tablet   benzonatate (TESSALON) 200 MG capsule   buPROPion (WELLBUTRIN XL) 150 MG 24 hr tablet   carvedilol  (COREG ) 6.25 MG tablet   cephALEXin  (KEFLEX ) 500 MG capsule   clorazepate  (TRANXENE ) 3.75 MG tablet   estradiol (ESTRACE) 0.1 MG/GM vaginal cream   ipratropium (ATROVENT ) 0.06 % nasal spray   Multiple Vitamins-Minerals (BLADDER 2.2) TABS   nitrofurantoin , macrocrystal-monohydrate, (MACROBID ) 100 MG capsule   nystatin cream (MYCOSTATIN)   omeprazole (PRILOSEC) 40 MG capsule   spironolactone (ALDACTONE) 50 MG tablet   No current facility-administered medications for this encounter.    Shannon Hoots Ward, PA-C [facility] [phone]

## 2023-11-09 ENCOUNTER — Other Ambulatory Visit: Payer: Self-pay

## 2023-11-09 ENCOUNTER — Encounter (HOSPITAL_COMMUNITY)
Admission: RE | Disposition: A | Discharge status: Home / Self Care | Payer: Self-pay | Source: Home / Self Care | Attending: Urology

## 2023-11-09 ENCOUNTER — Ambulatory Visit (HOSPITAL_COMMUNITY): Admitting: Anesthesiology

## 2023-11-09 ENCOUNTER — Ambulatory Visit (HOSPITAL_COMMUNITY): Payer: Self-pay | Admitting: Physician Assistant

## 2023-11-09 ENCOUNTER — Encounter (HOSPITAL_COMMUNITY): Payer: Self-pay | Admitting: Urology

## 2023-11-09 ENCOUNTER — Ambulatory Visit (HOSPITAL_COMMUNITY)
Admission: RE | Admit: 2023-11-09 | Discharge: 2023-11-09 | Disposition: A | Discharge status: Home / Self Care | Attending: Urology | Admitting: Urology

## 2023-11-09 DIAGNOSIS — N393 Stress incontinence (female) (male): Secondary | ICD-10-CM | POA: Insufficient documentation

## 2023-11-09 DIAGNOSIS — E119 Type 2 diabetes mellitus without complications: Secondary | ICD-10-CM | POA: Insufficient documentation

## 2023-11-09 DIAGNOSIS — Z87891 Personal history of nicotine dependence: Secondary | ICD-10-CM | POA: Insufficient documentation

## 2023-11-09 DIAGNOSIS — N3020 Other chronic cystitis without hematuria: Secondary | ICD-10-CM | POA: Diagnosis not present

## 2023-11-09 DIAGNOSIS — K219 Gastro-esophageal reflux disease without esophagitis: Secondary | ICD-10-CM | POA: Diagnosis not present

## 2023-11-09 DIAGNOSIS — Z87440 Personal history of urinary (tract) infections: Secondary | ICD-10-CM | POA: Insufficient documentation

## 2023-11-09 DIAGNOSIS — J449 Chronic obstructive pulmonary disease, unspecified: Secondary | ICD-10-CM

## 2023-11-09 DIAGNOSIS — I10 Essential (primary) hypertension: Secondary | ICD-10-CM | POA: Insufficient documentation

## 2023-11-09 HISTORY — PX: CYSTOSCOPY: SHX5120

## 2023-11-09 HISTORY — PX: INJECTION, BULKING AGENT, URETHRA: SHX7596

## 2023-11-09 LAB — GLUCOSE, CAPILLARY: Glucose-Capillary: 87 mg/dL (ref 70–99)

## 2023-11-09 SURGERY — CYSTOSCOPY
Anesthesia: Monitor Anesthesia Care

## 2023-11-09 MED ORDER — FENTANYL CITRATE (PF) 100 MCG/2ML IJ SOLN
INTRAMUSCULAR | Status: AC
  Filled 2023-11-09: qty 2

## 2023-11-09 MED ORDER — ONDANSETRON HCL 4 MG/2ML IJ SOLN
INTRAMUSCULAR | Status: DC | PRN
  Administered 2023-11-09: 4 mg via INTRAVENOUS

## 2023-11-09 MED ORDER — FENTANYL CITRATE (PF) 100 MCG/2ML IJ SOLN
INTRAMUSCULAR | Status: DC | PRN
  Administered 2023-11-09 (×2): 25 ug via INTRAVENOUS

## 2023-11-09 MED ORDER — PROPOFOL 500 MG/50ML IV EMUL
INTRAVENOUS | Status: DC | PRN
  Administered 2023-11-09: 100 ug/kg/min via INTRAVENOUS

## 2023-11-09 MED ORDER — ORAL CARE MOUTH RINSE: 15.0000 mL | Freq: Once | OROMUCOSAL | Status: AC

## 2023-11-09 MED ORDER — PROPOFOL 1000 MG/100ML IV EMUL
INTRAVENOUS | Status: AC
  Filled 2023-11-09: qty 100

## 2023-11-09 MED ORDER — PHENYLEPHRINE 80 MCG/ML (10ML) SYRINGE FOR IV PUSH (FOR BLOOD PRESSURE SUPPORT)
PREFILLED_SYRINGE | INTRAVENOUS | Status: DC | PRN
  Administered 2023-11-09 (×2): 160 ug via INTRAVENOUS
  Administered 2023-11-09: 240 ug via INTRAVENOUS

## 2023-11-09 MED ORDER — STERILE WATER FOR IRRIGATION IR SOLN
Status: DC | PRN
  Administered 2023-11-09: 3000 mL

## 2023-11-09 MED ORDER — ACETAMINOPHEN 10 MG/ML IV SOLN: 1000.0000 mg | Freq: Once | INTRAVENOUS | Status: DC | PRN

## 2023-11-09 MED ORDER — CEFAZOLIN SODIUM-DEXTROSE 2-4 GM/100ML-% IV SOLN
2.0000 g | INTRAVENOUS | Status: AC
  Administered 2023-11-09: 2 g via INTRAVENOUS
  Filled 2023-11-09: qty 100

## 2023-11-09 MED ORDER — FENTANYL CITRATE (PF) 50 MCG/ML IJ SOSY: 25.0000 ug | PREFILLED_SYRINGE | INTRAMUSCULAR | Status: DC | PRN

## 2023-11-09 MED ORDER — ONDANSETRON HCL 4 MG/2ML IJ SOLN
INTRAMUSCULAR | Status: AC
  Filled 2023-11-09: qty 2

## 2023-11-09 MED ORDER — PROPOFOL 10 MG/ML IV BOLUS
INTRAVENOUS | Status: DC | PRN
Start: 2023-11-09 — End: 2023-11-09
  Administered 2023-11-09 (×3): 10 mg via INTRAVENOUS

## 2023-11-09 MED ORDER — LACTATED RINGERS IV SOLN: INTRAVENOUS | Status: DC

## 2023-11-09 MED ORDER — CHLORHEXIDINE GLUCONATE 0.12 % MT SOLN
15.0000 mL | Freq: Once | OROMUCOSAL | Status: AC
  Administered 2023-11-09: 15 mL via OROMUCOSAL

## 2023-11-09 SURGICAL SUPPLY — 10 items
BAG URO CATCHER STRL LF (MISCELLANEOUS) ×1 IMPLANT
CLOTH BEACON ORANGE TIMEOUT ST (SAFETY) ×1 IMPLANT
GLOVE BIO SURGEON STRL SZ 6.5 (GLOVE) ×1 IMPLANT
GOWN STRL REUS W/ TWL LRG LVL3 (GOWN DISPOSABLE) ×1 IMPLANT
KIT TURNOVER KIT A (KITS) ×1 IMPLANT
MANIFOLD NEPTUNE II (INSTRUMENTS) ×1 IMPLANT
PACK CYSTO (CUSTOM PROCEDURE TRAY) ×1 IMPLANT
SYSTEM URETHRAL BULK BULKAMID (Female Continence) IMPLANT
TUBING CONNECTING 10 (TUBING) ×1 IMPLANT
WATER STERILE IRR 3000ML UROMA (IV SOLUTION) ×1 IMPLANT

## 2023-11-09 NOTE — Interval H&P Note (Signed)
 History and Physical Interval Note:  11/09/2023 8:55 AM  Shannon Lynch  has presented today for surgery, with the diagnosis of STRESS URINARY INCONTIENCE.  The various methods of treatment have been discussed with the patient and family. After consideration of risks, benefits and other options for treatment, the patient has consented to  Procedure(s) with comments: CYSTOSCOPY (N/A) - CYSTOSCOPY WITH BULKAMID INJECTION, BULKING AGENT, URETHRA (N/A) as a surgical intervention.  The patient's history has been reviewed, patient examined, no change in status, stable for surgery.  I have reviewed the patient's chart and labs.  Questions were answered to the patient's satisfaction.     Elorah Dewing D Azizi Bally

## 2023-11-09 NOTE — Discharge Instructions (Signed)
Cystoscopy with Bulkamid patient instructions  Following a cystoscopy, a catheter (a flexible rubber tube) is sometimes left in place to empty the bladder. This may cause some discomfort or a feeling that you need to urinate. Your doctor determines the period of time that the catheter will be left in place. You may have bloody urine for two to three days (Call your doctor if the amount of bleeding increases or does not subside).  You may pass blood clots in your urine, especially if you had a biopsy. It is not unusual to pass small blood clots and have some bloody urine a couple of weeks after your cystoscopy. Again, call your doctor if the bleeding does not subside. You may have: Dysuria (painful urination) Frequency (urinating often) Urgency (strong desire to urinate)  These symptoms are common especially if medicine is instilled into the bladder or a ureteral stent is placed. Avoiding alcohol and caffeine, such as coffee, tea, and chocolate, may help relieve these symptoms. Drink plenty of water, unless otherwise instructed. Your doctor may also prescribe an antibiotic or other medicine to reduce these symptoms.  Cystoscopy results are available soon after the procedure; biopsy results usually take two to four days. Your doctor will discuss the results of your exam with you. Before you go home, you will be given specific instructions for follow-up care. Special Instructions:   If you are going home with a catheter in place do not take a tub bath until removed by your doctor.   You may resume your normal activities.   Do not drive or operate machinery if you are taking narcotic pain medicine.   Be sure to keep all follow-up appointments with your doctor.   Call Your Doctor If: The catheter is not draining You have severe pain You are unable to urinate You have a fever over 101 You have severe bleeding

## 2023-11-09 NOTE — Op Note (Signed)
 Operative Note   Preoperative diagnosis:  1.  Stress urinary incontinence   Postoperative diagnosis: 1.  Stress urinary incontinence   Procedure(s): 1.  Cystoscopy with injection of bulkamid   Surgeon: Valli Shank, MD   Assistants:  None   Anesthesia:  General   Complications:  None   EBL:  minimal   Specimens: 1. none   Drains/Catheters: 1.  none   Intraoperative findings:   Normal urethra Bilateral orthotopic Uos Normal bladder mucosa   Indication: 78 yo woman with symptomatic stress urinary incontinence.   Description of procedure:   After risks and benefits of the procedure discussed with the patient, informed consent was obtained.  The patient was taken to the operating placed in the supine position.  Anesthesia was induced and antibiotics were administered.  The patient was then repositioned in the dorsolithotomy position.  She was prepped and draped in usual sterile fashion a timeout performed with the attending present.  The cystoscope was assembled with the Bulkamid system.  It was then placed in the urethral meatus and advanced into the bladder under direct visualization.  Prior cystoscopy had been done which noted normal anatomic landmarks.  These were again verified during cystoscopy today.  The cystoscope was brought back to the bladder neck and the needle was advanced through the needle guide at the 1 o'clock position.  Once it was visualized and advanced it was rotated to the 5 o'clock position.  Bulkamid was then injected until bleb was seen.  This was then repeated at the 1 o'clock position in the 7 o'clock position until coaptation was noted.   This concluded the case.  The patient's bladder was left with approximately 200 cc of sterile saline.  The patient emerged from anesthesia and was transferred the PACU in stable condition.   Plan:  Plan for patient to void in PACU prior to discharge.

## 2023-11-09 NOTE — Transfer of Care (Signed)
 Immediate Anesthesia Transfer of Care Note  Patient: Shannon Lynch  Procedure(s) Performed: CYSTOSCOPY INJECTION, BULKING AGENT, URETHRA  Patient Location: PACU  Anesthesia Type:MAC  Level of Consciousness: awake, alert , and oriented  Airway & Oxygen Therapy: Patient Spontanous Breathing and Patient connected to nasal cannula oxygen  Post-op Assessment: Report given to RN and Post -op Vital signs reviewed and stable  Post vital signs: Reviewed and stable  Last Vitals:  Vitals Value Taken Time  BP    Temp    Pulse 59 11/09/23 09:52  Resp 13 11/09/23 09:52  SpO2 100 % 11/09/23 09:52  Vitals shown include unfiled device data.  Last Pain:  Vitals:   11/09/23 0804  TempSrc: Oral  PainSc: 0-No pain         Complications: No notable events documented.

## 2023-11-09 NOTE — Anesthesia Postprocedure Evaluation (Signed)
 Anesthesia Post Note  Patient: Shannon Lynch  Procedure(s) Performed: CYSTOSCOPY INJECTION, BULKING AGENT, URETHRA     Patient location during evaluation: PACU Anesthesia Type: MAC Level of consciousness: awake and alert Pain management: pain level controlled Vital Signs Assessment: post-procedure vital signs reviewed and stable Respiratory status: spontaneous breathing, nonlabored ventilation, respiratory function stable and patient connected to nasal cannula oxygen Cardiovascular status: stable and blood pressure returned to baseline Postop Assessment: no apparent nausea or vomiting Anesthetic complications: no   No notable events documented.  Last Vitals:  Vitals:   11/09/23 1029 11/09/23 1048  BP: (!) 131/48 (P) 113/74  Pulse: (!) 59 (P) 62  Resp: 15 (P) 18  Temp: (!) 36.4 C (!) (P) 36.2 C  SpO2: 96% (P) 93%    Last Pain:  Vitals:   11/09/23 1143  TempSrc:   PainSc: 0-No pain                 Cordella P Caitlinn Klinker

## 2023-11-10 ENCOUNTER — Encounter (HOSPITAL_COMMUNITY): Payer: Self-pay | Admitting: Urology
